# Patient Record
Sex: Male | Born: 1960 | Race: White | Hispanic: No | Marital: Married | Smoking: Former smoker
Health system: Southern US, Community
[De-identification: ages and names within clinical notes are randomized; demographics above are authoritative.]

## PROBLEM LIST (undated history)

## (undated) DIAGNOSIS — F17200 Nicotine dependence, unspecified, uncomplicated: Secondary | ICD-10-CM

## (undated) DIAGNOSIS — I10 Essential (primary) hypertension: Secondary | ICD-10-CM

## (undated) DIAGNOSIS — G4730 Sleep apnea, unspecified: Secondary | ICD-10-CM

## (undated) DIAGNOSIS — M1990 Unspecified osteoarthritis, unspecified site: Secondary | ICD-10-CM

## (undated) DIAGNOSIS — T7840XA Allergy, unspecified, initial encounter: Secondary | ICD-10-CM

## (undated) DIAGNOSIS — Z803 Family history of malignant neoplasm of breast: Secondary | ICD-10-CM

## (undated) DIAGNOSIS — K760 Fatty (change of) liver, not elsewhere classified: Secondary | ICD-10-CM

## (undated) DIAGNOSIS — R7303 Prediabetes: Secondary | ICD-10-CM

## (undated) DIAGNOSIS — Z800 Family history of malignant neoplasm of digestive organs: Secondary | ICD-10-CM

## (undated) DIAGNOSIS — K219 Gastro-esophageal reflux disease without esophagitis: Secondary | ICD-10-CM

## (undated) DIAGNOSIS — E785 Hyperlipidemia, unspecified: Secondary | ICD-10-CM

## (undated) DIAGNOSIS — Z8481 Family history of carrier of genetic disease: Secondary | ICD-10-CM

## (undated) HISTORY — DX: Morbid (severe) obesity due to excess calories: E66.01

## (undated) HISTORY — DX: Sleep apnea, unspecified: G47.30

## (undated) HISTORY — DX: Hyperlipidemia, unspecified: E78.5

## (undated) HISTORY — DX: Gastro-esophageal reflux disease without esophagitis: K21.9

## (undated) HISTORY — DX: Family history of carrier of genetic disease: Z84.81

## (undated) HISTORY — DX: Nicotine dependence, unspecified, uncomplicated: F17.200

## (undated) HISTORY — DX: Family history of malignant neoplasm of digestive organs: Z80.0

## (undated) HISTORY — PX: JOINT REPLACEMENT: SHX530

## (undated) HISTORY — DX: Fatty (change of) liver, not elsewhere classified: K76.0

## (undated) HISTORY — DX: Essential (primary) hypertension: I10

## (undated) HISTORY — DX: Allergy, unspecified, initial encounter: T78.40XA

## (undated) HISTORY — DX: Unspecified osteoarthritis, unspecified site: M19.90

## (undated) HISTORY — DX: Family history of malignant neoplasm of breast: Z80.3

## (undated) HISTORY — PX: KNEE ARTHROSCOPY: SUR90

---

## 2001-10-26 ENCOUNTER — Emergency Department (HOSPITAL_COMMUNITY): Admission: EM | Admit: 2001-10-26 | Discharge: 2001-10-26 | Payer: Self-pay | Admitting: *Deleted

## 2001-10-26 ENCOUNTER — Encounter: Payer: Self-pay | Admitting: Emergency Medicine

## 2003-04-15 ENCOUNTER — Encounter: Payer: Self-pay | Admitting: Internal Medicine

## 2003-04-15 ENCOUNTER — Ambulatory Visit (HOSPITAL_BASED_OUTPATIENT_CLINIC_OR_DEPARTMENT_OTHER): Admission: RE | Admit: 2003-04-15 | Discharge: 2003-04-15 | Payer: Self-pay | Admitting: Family Medicine

## 2003-05-16 ENCOUNTER — Emergency Department (HOSPITAL_COMMUNITY): Admission: EM | Admit: 2003-05-16 | Discharge: 2003-05-16 | Payer: Self-pay | Admitting: Emergency Medicine

## 2003-05-16 ENCOUNTER — Encounter: Payer: Self-pay | Admitting: Emergency Medicine

## 2005-01-08 ENCOUNTER — Ambulatory Visit: Payer: Self-pay | Admitting: Family Medicine

## 2005-02-26 ENCOUNTER — Ambulatory Visit: Payer: Self-pay | Admitting: Family Medicine

## 2005-06-19 ENCOUNTER — Ambulatory Visit: Payer: Self-pay | Admitting: Family Medicine

## 2006-08-13 ENCOUNTER — Ambulatory Visit: Payer: Self-pay | Admitting: Family Medicine

## 2007-02-14 ENCOUNTER — Ambulatory Visit: Payer: Self-pay | Admitting: Family Medicine

## 2007-02-14 LAB — CONVERTED CEMR LAB
ALT: 40 units/L (ref 0–40)
AST: 30 units/L (ref 0–37)
Cholesterol: 158 mg/dL (ref 0–200)
HDL: 32.6 mg/dL — ABNORMAL LOW (ref >39.0)
Hgb A1c MFr Bld: 5.6 % (ref 4.6–6.0)
LDL Cholesterol: 108 mg/dL — ABNORMAL HIGH (ref 0–99)
Total CHOL/HDL Ratio: 4.8
Triglycerides: 88 mg/dL (ref 0–149)
VLDL: 18 mg/dL (ref 0–40)

## 2007-03-13 ENCOUNTER — Ambulatory Visit: Payer: Self-pay | Admitting: Family Medicine

## 2007-07-16 ENCOUNTER — Emergency Department (HOSPITAL_COMMUNITY): Admission: EM | Admit: 2007-07-16 | Discharge: 2007-07-17 | Payer: Self-pay | Admitting: Emergency Medicine

## 2007-07-22 DIAGNOSIS — G4730 Sleep apnea, unspecified: Secondary | ICD-10-CM | POA: Insufficient documentation

## 2007-07-22 DIAGNOSIS — G4733 Obstructive sleep apnea (adult) (pediatric): Secondary | ICD-10-CM | POA: Insufficient documentation

## 2007-07-22 DIAGNOSIS — I10 Essential (primary) hypertension: Secondary | ICD-10-CM | POA: Insufficient documentation

## 2007-07-22 DIAGNOSIS — F17200 Nicotine dependence, unspecified, uncomplicated: Secondary | ICD-10-CM | POA: Insufficient documentation

## 2007-07-22 DIAGNOSIS — E785 Hyperlipidemia, unspecified: Secondary | ICD-10-CM | POA: Insufficient documentation

## 2007-08-04 ENCOUNTER — Ambulatory Visit: Payer: Self-pay | Admitting: Family Medicine

## 2007-08-04 DIAGNOSIS — K219 Gastro-esophageal reflux disease without esophagitis: Secondary | ICD-10-CM | POA: Insufficient documentation

## 2007-08-06 LAB — CONVERTED CEMR LAB
ALT: 46 units/L (ref 0–53)
AST: 35 units/L (ref 0–37)
Albumin: 3.9 g/dL (ref 3.5–5.2)
Alkaline Phosphatase: 59 units/L (ref 39–117)
BUN: 11 mg/dL (ref 6–23)
Basophils Absolute: 0 10*3/uL (ref 0.0–0.1)
Basophils Relative: 0.3 % (ref 0.0–1.0)
Bilirubin, Direct: 0.1 mg/dL (ref 0.0–0.3)
CO2: 33 meq/L — ABNORMAL HIGH (ref 19–32)
Calcium: 10 mg/dL (ref 8.4–10.5)
Chloride: 103 meq/L (ref 96–112)
Cholesterol: 156 mg/dL (ref 0–200)
Creatinine, Ser: 1 mg/dL (ref 0.4–1.5)
Eosinophils Absolute: 0.2 10*3/uL (ref 0.0–0.6)
Eosinophils Relative: 2.3 % (ref 0.0–5.0)
GFR calc Af Amer: 104 mL/min
GFR calc non Af Amer: 86 mL/min
Glucose, Bld: 101 mg/dL — ABNORMAL HIGH (ref 70–99)
HCT: 46.1 % (ref 39.0–52.0)
HDL: 32.9 mg/dL — ABNORMAL LOW (ref >39.0)
Hemoglobin: 16 g/dL (ref 13.0–17.0)
LDL Cholesterol: 109 mg/dL — ABNORMAL HIGH (ref 0–99)
Lymphocytes Relative: 31.8 % (ref 12.0–46.0)
MCHC: 34.8 g/dL (ref 30.0–36.0)
MCV: 89.2 fL (ref 78.0–100.0)
Monocytes Absolute: 0.9 10*3/uL — ABNORMAL HIGH (ref 0.2–0.7)
Monocytes Relative: 9.2 % (ref 3.0–11.0)
Neutro Abs: 5.2 10*3/uL (ref 1.4–7.7)
Neutrophils Relative %: 56.4 % (ref 43.0–77.0)
Platelets: 252 10*3/uL (ref 150–400)
Potassium: 4.1 meq/L (ref 3.5–5.1)
RBC: 5.17 M/uL (ref 4.22–5.81)
RDW: 13.4 % (ref 11.5–14.6)
Sodium: 144 meq/L (ref 135–145)
TSH: 1.5 microintl units/mL (ref 0.35–5.50)
Total Bilirubin: 1.1 mg/dL (ref 0.3–1.2)
Total CHOL/HDL Ratio: 4.7
Total Protein: 7.4 g/dL (ref 6.0–8.3)
Triglycerides: 72 mg/dL (ref 0–149)
VLDL: 14 mg/dL (ref 0–40)
WBC: 9.3 10*3/uL (ref 4.5–10.5)

## 2008-12-30 ENCOUNTER — Telehealth: Payer: Self-pay | Admitting: Family Medicine

## 2009-08-31 ENCOUNTER — Encounter: Payer: Self-pay | Admitting: Family Medicine

## 2009-09-01 ENCOUNTER — Encounter: Payer: Self-pay | Admitting: Family Medicine

## 2009-11-11 ENCOUNTER — Ambulatory Visit: Payer: Self-pay | Admitting: Family Medicine

## 2010-08-31 ENCOUNTER — Encounter: Payer: Self-pay | Admitting: Family Medicine

## 2010-12-07 ENCOUNTER — Telehealth: Payer: Self-pay | Admitting: Family Medicine

## 2011-01-24 ENCOUNTER — Ambulatory Visit
Admission: RE | Admit: 2011-01-24 | Discharge: 2011-01-24 | Payer: Self-pay | Source: Home / Self Care | Attending: Family Medicine | Admitting: Family Medicine

## 2011-01-24 ENCOUNTER — Encounter: Payer: Self-pay | Admitting: Family Medicine

## 2011-01-24 ENCOUNTER — Telehealth: Payer: Self-pay | Admitting: Family Medicine

## 2011-01-24 LAB — CONVERTED CEMR LAB
Bacteria, UA: 0
Blood in Urine, dipstick: NEGATIVE
Ketones, urine, test strip: NEGATIVE
Mucus, UA: 0
Nitrite: NEGATIVE
Protein, U semiquant: NEGATIVE
RBC / HPF: 0
Specific Gravity, Urine: >=1.03
Urobilinogen, UA: 0.2

## 2011-01-30 NOTE — Progress Notes (Signed)
Summary: lisinopril  Prescriptions: LISINOPRIL-HYDROCHLOROTHIAZIDE 20-25 MG  TABS (LISINOPRIL-HYDROCHLOROTHIAZIDE) take one by mouth daily  #30 x 0   Entered by:   Melody Comas   Authorized by:   Judith Part MD   Signed by:   Melody Comas on 12/07/2010   Method used:   Electronically to        [facility] (retail)       [address]       [place], [place]  [address]       Ph: [phone]       Fax: [phone]   RxID:   [id number] LISINOPRIL-HYDROCHLOROTHIAZIDE 20-25 MG  TABS (LISINOPRIL-HYDROCHLOROTHIAZIDE) take one by mouth daily  #90 x 3   Entered and Authorized by:   Melody Comas   Signed by:   Melody Comas on 12/07/2010   Method used:   Print then Give to Patient   RxID:   [id number]  A written rx was not given to patient, it was done in error.

## 2011-02-01 NOTE — Progress Notes (Signed)
Summary: Lisinopril Refill  Phone Note Refill Request   Refills Requested: Medication #1:  LISINOPRIL-HYDROCHLOROTHIAZIDE 20-25 MG  TABS take one by mouth daily Patient is out of med and needs refills. He forgot to mention it while he was at his appt. Send to [facility] on [address].    Method Requested: Electronic Initial call taken by: Janee Morn CMA Duncan Dull),  January 24, 2011 2:26 PM  Follow-up for Phone Call        I will refil electronically Follow-up by: Judith Part MD,  January 24, 2011 4:24 PM  Additional Follow-up for Phone Call Additional follow up Details #1::        Dr Milinda Antis sent med to [facility] on [address] electronically. Left message for patient to call back. Lewanda Rife LPN  January 24, 2011 4:40 PM   Advised pt. Additional Follow-up by: Lowella Petties CMA, AAMA,  January 24, 2011 4:48 PM

## 2011-02-07 NOTE — Letter (Signed)
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

## 2011-02-07 NOTE — Assessment & Plan Note (Signed)
Summary: DOT PHYSICIAL   Vital Signs:  Patient profile:   50 year old male Height:      69.5 inches Weight:      375 pounds BMI:     54.78 Temp:     97.5 degrees F oral Pulse rate:   88 / minute Pulse rhythm:   regular BP sitting:   110 / 70  (left arm) Cuff size:   large CC: DOT physical  Vision Screening:Left eye w/o correction: 20 / 20 Right Eye w/o correction: 20 / 15 Both eyes w/o correction:  20/ 15  Color vision testing: normal      Vision Entered By: Selena Batten Dance CMA Duncan Dull) (January 24, 2011 12:49 PM)  Hearing Screen 25db HL: Left  500 hz: 25db 1000 hz: 25db 2000 hz: 25db 4000 hz: 25db Right  500 hz: 25db 1000 hz: 25db 2000 hz: 25db 4000 hz: 25db    History of Present Illness: her for DOT exam  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    vision screen ok and hearing ok  ua ok   cpap works well for his sleep apnea    bp good at 110/70 HTN well controlled   lipids trig 93 and HDL 32 and LDL 95  on red yeast rice  wt down a bit - watching diet - more green veg also avoiding fatty meats and fried stuff  planning some exercise  bcb   did PE and blood work at station in [employer] 66     Allergies (verified): No Known Drug Allergies  Past History:  Past Surgical History: Last updated: 07/22/2007 Knee arthroscopy Stress test- neg per patient (2004)  Family History: Last updated: 08/04/2007 Father: MI at 14 Mother: HTN, MI  Siblings:   Social History: Last updated: 07/22/2007 Marital Status: Married Children: none 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 Eyes:  Denies blurring and eye irritation. ENT:  Denies decreased hearing. CV:  Denies chest pain or discomfort, lightheadness, palpitations, and shortness of breath with  exertion. Resp:  Denies cough, shortness of breath, and wheezing. GI:  Denies abdominal pain, change in bowel habits, indigestion, and nausea. GU:  Denies dysuria, nocturia, urinary frequency, and urinary hesitancy. MS:  Denies joint pain, joint swelling, muscle weakness, and stiffness. Derm:  Denies itching, lesion(s), poor wound healing, and rash. Neuro:  Denies headaches, numbness, and tingling. Psych:  mood is generally good . Endo:  Denies cold intolerance, excessive thirst, excessive urination, and heat intolerance. Heme:  Denies abnormal bruising and bleeding.  Physical Exam  General:  morbidly obese and well appearing  Head:  normocephalic, atraumatic, and no abnormalities observed.   Eyes:  vision grossly intact, pupils equal, pupils round, and pupils reactive to light.  no conjunctival pallor, injection or icterus  Ears:  R ear normal and L ear normal.  - scant cerumen Nose:  no nasal discharge.   Mouth:  pharynx pink and moist.   Neck:  supple with full rom and no masses or thyromegally, no JVD or carotid bruit  Chest Wall:  No deformities, masses, tenderness or gynecomastia noted. Lungs:  somewhat distant bs with good air exch and no crackles or wheeze Heart:  Normal rate and regular rhythm. S1 and S2 normal without gallop, murmur, click, rub or other extra sounds. Abdomen:  Bowel sounds positive,abdomen soft and non-tender without masses, organomegaly or hernias noted. no renal bruits  Msk:  No deformity or scoliosis noted of thoracic or lumbar spine.  no acute joint changes  Pulses:  plus one pedal pulses  Extremities:  no CCE today Neurologic:  sensation intact to light touch, gait normal, and DTRs symmetrical and normal.   Skin:  Intact without suspicious lesions or rashes Cervical Nodes:  No lymphadenopathy noted Inguinal Nodes:  No significant adenopathy Psych:  normal affect, talkative and pleasant    Impression & Recommendations:  Problem # 1:  OTH GENERAL  MEDICAL EXAMINATION ADMIN PURPOSES (ICD-V70.3) Assessment Comment Only  here for DOT physical  no restrictions for current work  I do still recommend wt loss for general health  rev health habits/ vision/ hearing tests and ua  forms filled out for 2 year clearance dot   Orders: UA Dipstick w/o Micro (manual) (16109) Audiometry (60454) Vision Screening (09811) Work-Related DOT (91478)  Problem # 2:  HYPERTENSION (ICD-401.9) Assessment: Unchanged  good control - no change med refilled exercise and wt loss recommended  His updated medication list for this problem includes:    Lisinopril-hydrochlorothiazide 20-25 Mg Tabs (Lisinopril-hydrochlorothiazide) .Marland Kitchen... Take one by mouth daily  Orders: UA Dipstick w/o Micro (manual) (29562) Audiometry [phone]) Vision Screening [phone]) Prescription Created Electronically [phone]) Work-Related DOT [phone])  BP today: 110/70 Prior BP: 124/80 (11/11/2009)  Labs Reviewed: K+: 4.1 (08/04/2007) Creat: : 1.0 (08/04/2007)   Chol: 156 (08/04/2007)   HDL: 32.9 (08/04/2007)   LDL: 109 (08/04/2007)   TG: 72 (08/04/2007)  Problem # 3:  HYPERLIPIDEMIA (ICD-272.4) Assessment: Unchanged  currently controlled with diet and red yeast rice will continue to watch transaminases with this   Labs Reviewed: SGOT: 35 (08/04/2007)   SGPT: 46 (08/04/2007)   HDL:32.9 (08/04/2007), 32.6 (02/14/2007)  LDL:109 (08/04/2007), 108 (02/14/2007)  Chol:156 (08/04/2007), 158 (02/14/2007)  Trig:72 (08/04/2007), 88 (02/14/2007)  Orders: UA Dipstick w/o Micro (manual) (24401) Audiometry (02725) Vision Screening (36644) Work-Related DOT (03474)  Complete Medication List: 1)  Adult Aspirin Low Strength 81 Mg Tbdp (Aspirin) .... Take one by mouth daily 2)  Fish Oil Tabs  .... Take two by mouth bid 3)  Vitamin B-12 500 Mcg Tabs (Cyanocobalamin) .... Take one by mouth daily 4)  Ra Vitamin B-6 Cr 200 Mg Tbcr (Pyridoxine hcl) .... Take one by mouth daily 5)   Lisinopril-hydrochlorothiazide 20-25 Mg Tabs (Lisinopril-hydrochlorothiazide) .... Take one by mouth daily 6)  Cpap Mask For Nose  .... To use with sleep as directed for sleep apnea 7)  Cpap Machine  .... For use nightly as directed for sleep apnea 8)  Red Yeast Rice 600 Mg Caps (Red yeast rice extract) .Marland Kitchen.. 1 by mouth once daily 9)  Cinnamon 500 Mg Caps (Cinnamon) .Marland Kitchen.. 1 by mouth once daily 10)  Co Q-10 100 Mg Caps (Coenzyme q10) .Marland Kitchen.. 1 by mouth once daily 11)  Glucosamine 500 Mg Caps (Glucosamine sulfate) .Marland Kitchen.. 1 by mouth once daily 12)  Omeprazole 40 Mg Cpdr (Omeprazole) .Marland Kitchen.. 1 by mouth once daily 13)  Claritin 10 Mg Tabs (Loratadine) .Marland Kitchen.. 1 by mouth once daily  Patient Instructions: 1)  you can raise your HDL (good cholesterol) by increasing exercise and eating omega 3 fatty acid supplement like fish oil or flax seed oil over the counter 2)  you can lower LDL (bad cholesterol) by limiting saturated fats in diet like red meat, fried foods, egg yolks, fatty breakfast meats, high fat dairy products and shellfish  3)  keep working on weight loss  4)  please copy labs and paperwork Prescriptions: LISINOPRIL-HYDROCHLOROTHIAZIDE 20-25 MG  TABS (LISINOPRIL-HYDROCHLOROTHIAZIDE) take one by mouth daily  #30 x 11   Entered and Authorized by:   Judith Part MD   Signed by:   Judith Part MD on 01/24/2011   Method used:   Electronically to        [facility] (retail)       [address]       [place], [place]  [address]       Ph: [phone]       Fax: [phone]   RxID:   [id number]    Orders Added: 1)  UA Dipstick w/o Micro (manual) [81002] 2)  Audiometry [92552] 3)  Vision Screening [66440] 4)  Prescription Created Electronically [[id number]] 5)  Work-Related DOT [34742]    Current Allergies (reviewed today): No known allergies   Laboratory Results   Urine Tests  Date/Time Received: January 24, 2011 12:51 PM  Date/Time Reported: January 24, 2011 12:51 PM   Routine Urinalysis   Color: amber Appearance: Clear Glucose: negative   (Normal Range: Negative) Bilirubin: negative   (Normal Range: Negative) Ketone: negative   (Normal Range: Negative) Spec. Gravity: >=1.030   (Normal Range: 1.003-1.035) Blood: negative   (Normal Range: Negative) pH: 6.0   (Normal Range: 5.0-8.0) Protein: negative   (Normal Range: Negative) Urobilinogen: 0.2   (Normal Range: 0-1) Nitrite: negative   (Normal Range: Negative) Leukocyte Esterace: negative   (Normal Range: Negative)  Urine Microscopic WBC/HPF: 0 RBC/HPF: 0 Bacteria/HPF: 0 Mucous/HPF: 0 Epithelial/HPF: 0-2 Crystals/HPF: few Casts/LPF: 0 Yeast/HPF: 0 Other: 0

## 2011-06-07 ENCOUNTER — Encounter: Payer: Self-pay | Admitting: Family Medicine

## 2011-06-08 ENCOUNTER — Ambulatory Visit (INDEPENDENT_AMBULATORY_CARE_PROVIDER_SITE_OTHER): Payer: 59 | Admitting: Family Medicine

## 2011-06-08 ENCOUNTER — Encounter: Payer: Self-pay | Admitting: Family Medicine

## 2011-06-08 VITALS — BP 126/78 | HR 84 | Temp 97.7°F | Wt 366.1 lb

## 2011-06-08 DIAGNOSIS — M25569 Pain in unspecified knee: Secondary | ICD-10-CM

## 2011-06-08 DIAGNOSIS — M25561 Pain in right knee: Secondary | ICD-10-CM

## 2011-06-08 MED ORDER — IBUPROFEN 200 MG PO TABS: 600.0000 mg | ORAL_TABLET | Freq: Three times a day (TID) | ORAL | Status: AC | PRN

## 2011-06-08 MED ORDER — HYDROCODONE-ACETAMINOPHEN 5-500 MG PO TABS: 1.0000 | ORAL_TABLET | Freq: Four times a day (QID) | ORAL | Status: DC | PRN

## 2011-06-08 NOTE — Patient Instructions (Signed)
I would take 600mg  of ibuprofen up to 3 times a day with food.  Use vicodin as needed with sedation caution. If pain persists, call back so we can talk about options (ie seeing Dr. Eulah Pont).

## 2011-06-08 NOTE — Assessment & Plan Note (Addendum)
I would take 600mg  of ibuprofen up to 3 times a day with food.  Use vicodin as needed with sedation caution.  Likely OA flare with possible MCL strain.  I don't feel sig laxity in joint.  Okay for outpatient fu.  If pain persists, call back so we can talk about options (ie seeing Dr. Eulah Pont or imaging).  I talked to pt about getting weight off to help his knees.  He understood.  Okay for outpatient fu.

## 2011-06-08 NOTE — Progress Notes (Signed)
R Knee pain.  Was unhooking trailer 3-4 days ago and turned.  Felt a pop in his R knee.  Pain since then.  Pain with walking.  No pain sitting.  Can still hear a pop/crackle with ROM now. No swelling. No bruising.  This had happened before but the pain has been worse with this episode.  Pain is medial side of knee.   Prev had seen Dr. Eulah Pont with ortho for L knee.    Meds, vitals, and allergies reviewed.   ROS: See HPI.  Otherwise, noncontributory.  Nad, overweight R knee not puffy or bruised Joint line not ttp Normal ROM but medial pain on terminal flexion, + crepitus on flex/ext MCL tender on testing but doesn't feel lax.  LCL feels stable, not tender on testing.  ACL feels stable No meniscal click elicited. Able to bear weight

## 2011-06-11 ENCOUNTER — Ambulatory Visit: Payer: Self-pay | Admitting: Family Medicine

## 2011-07-02 ENCOUNTER — Telehealth: Payer: Self-pay | Admitting: *Deleted

## 2011-07-02 DIAGNOSIS — M25569 Pain in unspecified knee: Secondary | ICD-10-CM

## 2011-07-02 NOTE — Telephone Encounter (Signed)
Patient was seen two weeks ago with knee pain and he says that now the pain seems to be worse. He is asking what he should do at this point. Please advise.

## 2011-07-03 NOTE — Telephone Encounter (Signed)
Patient notified as instructed by telephone. Was informed by patient that he would like the referral and does not need a refill on the Vicodin at this time. Informed patient that the referral coordinator will be in touch with him to get this set up.

## 2011-07-03 NOTE — Telephone Encounter (Signed)
Left message on machine to call back  

## 2011-07-03 NOTE — Telephone Encounter (Signed)
Patient notified as instructed by telephone. Was informed by patient that he is okay with the Vicodin at this point and does not need a refill

## 2011-07-03 NOTE — Telephone Encounter (Signed)
I thought this would be a self limited flare/strain, but if he isn't getting better we should set him up with Dr. Eulah Pont.  I put in the referral.  See if he needs a refill on pain meds in the meantime- if he does, please call in vicodin as above, 1 po q6h prn pain, #30, no rf.  Thanks.

## 2011-07-12 NOTE — Telephone Encounter (Signed)
Working on [employer]

## 2011-07-19 ENCOUNTER — Telehealth: Payer: Self-pay | Admitting: *Deleted

## 2011-07-19 NOTE — Telephone Encounter (Signed)
Opened in error

## 2011-10-15 LAB — POCT CARDIAC MARKERS
CKMB, poc: 1.1
Myoglobin, poc: 145
Operator id: 272551
Troponin i, poc: <0.05
Troponin i, poc: <0.05

## 2011-10-15 LAB — I-STAT 8, (EC8 V) (CONVERTED LAB)
Acid-Base Excess: 1
Potassium: 3.8
TCO2: 28
pCO2, Ven: 43.5 — ABNORMAL LOW
pH, Ven: 7.394 — ABNORMAL HIGH

## 2011-10-15 LAB — POCT I-STAT CREATININE
Creatinine, Ser: 0.9
Operator id: 272551

## 2012-01-01 HISTORY — PX: COLONOSCOPY: SHX174

## 2012-01-31 ENCOUNTER — Telehealth: Payer: Self-pay | Admitting: Family Medicine

## 2012-02-04 ENCOUNTER — Telehealth: Payer: Self-pay | Admitting: Family Medicine

## 2012-02-04 MED ORDER — LISINOPRIL-HYDROCHLOROTHIAZIDE 20-25 MG PO TABS: 1.0000 | ORAL_TABLET | Freq: Every day | ORAL | Status: DC

## 2012-02-04 NOTE — Telephone Encounter (Signed)
Charles Morales is calling for a refill on his Lisinopril 20/25. He said he took his last one today and needs a refill sent to [address] on [address] in [place]. His phone number is [phone]. He takes 1 a day.

## 2012-02-04 NOTE — Telephone Encounter (Signed)
Done

## 2012-04-06 ENCOUNTER — Other Ambulatory Visit: Payer: Self-pay | Admitting: Family Medicine

## 2012-04-08 ENCOUNTER — Other Ambulatory Visit: Payer: Self-pay

## 2012-04-08 NOTE — Telephone Encounter (Signed)
pts wife said Lisinopril HCTZ was not at [facility]. I apologized and I spoke with [name] at [facility] and she said rx ready for pickup. Patient's wife notified as instructed by telephone that rx is ready. Mrs Geologist, engineering.

## 2012-05-20 ENCOUNTER — Telehealth: Payer: Self-pay | Admitting: Family Medicine

## 2012-05-20 DIAGNOSIS — E785 Hyperlipidemia, unspecified: Secondary | ICD-10-CM

## 2012-05-20 DIAGNOSIS — Z0000 Encounter for general adult medical examination without abnormal findings: Secondary | ICD-10-CM | POA: Insufficient documentation

## 2012-05-20 NOTE — Telephone Encounter (Signed)
Message copied by Judy Pimple on Tue May 20, 2012  5:42 PM ------      Message from: Alvina Chou      Created: Fri May 16, 2012 12:48 PM      Regarding: labs for Wed 5-22       Patient is scheduled for CPX labs, please order future labs, Thanks , Camelia Eng

## 2012-05-21 ENCOUNTER — Other Ambulatory Visit (INDEPENDENT_AMBULATORY_CARE_PROVIDER_SITE_OTHER): Payer: 59

## 2012-05-21 DIAGNOSIS — Z0000 Encounter for general adult medical examination without abnormal findings: Secondary | ICD-10-CM

## 2012-05-21 DIAGNOSIS — E785 Hyperlipidemia, unspecified: Secondary | ICD-10-CM

## 2012-05-21 LAB — CBC WITH DIFFERENTIAL/PLATELET
Basophils Absolute: 0 K/uL (ref 0.0–0.1)
Basophils Relative: 0.4 % (ref 0.0–3.0)
Eosinophils Absolute: 0.1 K/uL (ref 0.0–0.7)
Eosinophils Relative: 1.6 % (ref 0.0–5.0)
HCT: 44.8 % (ref 39.0–52.0)
Hemoglobin: 14.8 g/dL (ref 13.0–17.0)
Lymphocytes Relative: 34.6 % (ref 12.0–46.0)
Lymphs Abs: 2.3 K/uL (ref 0.7–4.0)
MCHC: 33.1 g/dL (ref 30.0–36.0)
MCV: 90.7 fl (ref 78.0–100.0)
Monocytes Absolute: 0.8 K/uL (ref 0.1–1.0)
Monocytes Relative: 11.9 % (ref 3.0–12.0)
Neutro Abs: 3.4 K/uL (ref 1.4–7.7)
Neutrophils Relative %: 51.5 % (ref 43.0–77.0)
Platelets: 182 K/uL (ref 150.0–400.0)
RBC: 4.94 Mil/uL (ref 4.22–5.81)
RDW: 14.6 % (ref 11.5–14.6)
WBC: 6.6 K/uL (ref 4.5–10.5)

## 2012-05-21 LAB — COMPREHENSIVE METABOLIC PANEL
ALT: 48 U/L (ref 0–53)
CO2: 28 mEq/L (ref 19–32)
Calcium: 9.3 mg/dL (ref 8.4–10.5)
Chloride: 105 mEq/L (ref 96–112)
GFR: 87.16 mL/min (ref >60.00)
Sodium: 141 mEq/L (ref 135–145)
Total Bilirubin: 1.2 mg/dL (ref 0.3–1.2)
Total Protein: 7.1 g/dL (ref 6.0–8.3)

## 2012-05-21 LAB — TSH: TSH: 1.22 u[IU]/mL (ref 0.35–5.50)

## 2012-05-21 LAB — LIPID PANEL: VLDL: 16.6 mg/dL (ref 0.0–40.0)

## 2012-05-23 ENCOUNTER — Ambulatory Visit (INDEPENDENT_AMBULATORY_CARE_PROVIDER_SITE_OTHER): Payer: BC Managed Care – PPO | Admitting: Family Medicine

## 2012-05-23 ENCOUNTER — Encounter: Payer: Self-pay | Admitting: Family Medicine

## 2012-05-23 VITALS — BP 102/70 | HR 60 | Temp 97.8°F | Ht 69.5 in | Wt 383.0 lb

## 2012-05-23 DIAGNOSIS — M25569 Pain in unspecified knee: Secondary | ICD-10-CM

## 2012-05-23 DIAGNOSIS — Z23 Encounter for immunization: Secondary | ICD-10-CM

## 2012-05-23 DIAGNOSIS — Z0000 Encounter for general adult medical examination without abnormal findings: Secondary | ICD-10-CM

## 2012-05-23 DIAGNOSIS — M25561 Pain in right knee: Secondary | ICD-10-CM

## 2012-05-23 DIAGNOSIS — E785 Hyperlipidemia, unspecified: Secondary | ICD-10-CM

## 2012-05-23 DIAGNOSIS — E669 Obesity, unspecified: Secondary | ICD-10-CM

## 2012-05-23 DIAGNOSIS — F17200 Nicotine dependence, unspecified, uncomplicated: Secondary | ICD-10-CM

## 2012-05-23 DIAGNOSIS — I10 Essential (primary) hypertension: Secondary | ICD-10-CM

## 2012-05-23 DIAGNOSIS — Z1211 Encounter for screening for malignant neoplasm of colon: Secondary | ICD-10-CM | POA: Insufficient documentation

## 2012-05-23 MED ORDER — LISINOPRIL-HYDROCHLOROTHIAZIDE 20-25 MG PO TABS: 1.0000 | ORAL_TABLET | Freq: Every day | ORAL | Status: DC

## 2012-05-23 NOTE — Patient Instructions (Signed)
We will refer you for colonosc at check out  Tdap vaccine today Make sure to get a flu shot next season Work on gradually working up to exercise 5 days per week -non impact like bike or swimming Eat healthy and cut portions for weight loss

## 2012-05-23 NOTE — Progress Notes (Signed)
Subjective:    Patient ID: Charles Morales, male    DOB: 15-Oct-1961, 51 y.o.   MRN: [id number]  HPI Here for health maintenance exam and to review chronic medical problems   Is feeling good  Doing well overall  Work has been very busy    bp is  102/70   Today No cp or palpitations or headaches or edema  No side effects to medicines  - ace/ hct   Chemistry      Component Value Date/Time   NA 141 05/21/2012 0812   K 3.6 05/21/2012 0812   CL 105 05/21/2012 0812   CO2 28 05/21/2012 0812   BUN 16 05/21/2012 0812   CREATININE 1.0 05/21/2012 0812      Component Value Date/Time   CALCIUM 9.3 05/21/2012 0812   ALKPHOS 43 05/21/2012 0812   AST 36 05/21/2012 0812   ALT 48 05/21/2012 0812   BILITOT 1.2 05/21/2012 0812      Wt is up 17 lb bmi is 55 Chews tab -- (not a smoker)- has cut back on that - is working towards quitting - quit date is on birthday in august  Does see the dentist regularly     Diet-- watching some of the fats - knows he will have to make some big changes  He made a big weight loss effort 2 years ago and lost 25 lb - gave up fried foods/ drank water / green tea and more salads  Was proud of himself/ had more energy and then fell off the wagon again  Portions are his biggest problem Exercise- very limited due to his knee Is interested in starting to swim-will have acess to pool    Tdap- due for  Flu shot --- did not get this season    Lipid status Lab Results  Component Value Date   CHOL 148 05/21/2012   CHOL 156 08/04/2007   CHOL 158 02/14/2007   Lab Results  Component Value Date   HDL 30.40* 05/21/2012   HDL 32.9* 08/04/2007   HDL 32.6* 02/14/2007   Lab Results  Component Value Date   LDLCALC 101* 05/21/2012   LDLCALC 109* 08/04/2007   LDLCALC 108* 02/14/2007   Lab Results  Component Value Date   TRIG 83.0 05/21/2012   TRIG 72 08/04/2007   TRIG 88 02/14/2007   Lab Results  Component Value Date   CHOLHDL 5 05/21/2012   CHOLHDL 4.7 CALC 08/04/2007   CHOLHDL  4.8 CALC 02/14/2007   No results found for this basename: LDLDIRECT   is on fish oil and omega 3  Cholesterol is fairly stable - well controlled  Is trying to eat more baked foods - but still has some fatty food Does take red yeast rice -no problems with that   Does continue to have R knee problems -- saw Dr Farris Has  He px meloxicam and cortisone shot  Is candidate for knee repl in future for OS   No prostate ca or prostate problems in the family   He has noticed blood in stool - occ bright red streaks  Strains occas   Patient Active Problem List  Diagnoses  . HYPERLIPIDEMIA  . OBESITY  . SMOKELESS TOBACCO ABUSE  . HYPERTENSION  . G E R D  . SLEEP APNEA  . Knee pain, right  . Routine general medical examination at a health care facility   Past Medical History  Diagnosis Date  . HLD (hyperlipidemia)   . HTN (hypertension)   .  Morbid obesity   . Fatty liver   . Esophageal reflux   . Unspecified sleep apnea   . Current smoker    Past Surgical History  Procedure Date  . Knee arthroscopy    History  Substance Use Topics  . Smoking status: Former Smoker    Quit date: 12/31/1982  . Smokeless tobacco: Current User    Types: Snuff  . Alcohol Use: Yes     Occasional   Family History  Problem Relation Age of Onset  . Heart attack Father 9  . Hypertension Mother   . Heart attack Mother    No Known Allergies Current Outpatient Prescriptions on File Prior to Visit  Medication Sig Dispense Refill  . aspirin 81 MG tablet Take 81 mg by mouth daily.        . Cinnamon 500 MG capsule Take 500 mg by mouth daily.        . Coenzyme Q10 (CO Q 10) 100 MG CAPS Take 1 capsule by mouth daily.        . fish oil-omega-3 fatty acids 1000 MG capsule Take 2 g by mouth 2 (two) times daily.        Marland Kitchen glucosamine-chondroitin 500-400 MG tablet Take 1 tablet by mouth daily.        Marland Kitchen lisinopril-hydrochlorothiazide (PRINZIDE,ZESTORETIC) 20-25 MG per tablet TAKE ONE TABLET BY MOUTH EVERY DAY   30 tablet  2  . loratadine (CLARITIN) 10 MG tablet Take 10 mg by mouth daily.        . NON FORMULARY CPAP at night as directed       . omeprazole (PRILOSEC) 40 MG capsule Take 40 mg by mouth daily.        Marland Kitchen pyridoxine (B-6) 200 MG tablet Take 200 mg by mouth daily.        . Red Yeast Rice 600 MG CAPS Take 1 capsule by mouth daily.        . vitamin B-12 (CYANOCOBALAMIN) 500 MCG tablet Take 500 mcg by mouth daily.            Review of Systems Review of Systems  Constitutional: Negative for fever, appetite change, fatigue and unexpected weight change.  Eyes: Negative for pain and visual disturbance.  Respiratory: Negative for cough and shortness of breath.   Cardiovascular: Negative for cp or palpitations    Gastrointestinal: Negative for nausea, diarrhea and constipation.  Genitourinary: Negative for urgency and frequency.  Skin: Negative for pallor or rash   MSK pos for chronic knee pain  Neurological: Negative for weakness, light-headedness, numbness and headaches.  Hematological: Negative for adenopathy. Does not bruise/bleed easily.  Psychiatric/Behavioral: Negative for dysphoric mood. The patient is not nervous/anxious.         Objective:   Physical Exam  Constitutional: He appears well-nourished.       Morbidly obese and well appearing   HENT:  Head: Normocephalic and atraumatic.  Right Ear: External ear normal.  Left Ear: External ear normal.  Nose: Nose normal.  Mouth/Throat: Oropharynx is clear and moist.  Eyes: Conjunctivae and EOM are normal. Pupils are equal, round, and reactive to light. No scleral icterus.  Neck: Normal range of motion. Neck supple. No JVD present. Carotid bruit is not present. No thyromegaly present.  Cardiovascular: Normal rate, regular rhythm, normal heart sounds and intact distal pulses.  Exam reveals no gallop.   Pulmonary/Chest: Effort normal and breath sounds normal. No respiratory distress. He has no wheezes.  Abdominal: Soft. Bowel sounds  are normal. He exhibits no distension, no abdominal bruit and no mass. There is no tenderness.  Musculoskeletal: Normal range of motion. He exhibits no edema and no tenderness.  Lymphadenopathy:    He has no cervical adenopathy.  Neurological: He is alert. He has normal reflexes. No cranial nerve deficit. He exhibits normal muscle tone. Coordination normal.  Skin: Skin is warm and dry. No rash noted. No erythema. No pallor.       Ruddy complexion  Psychiatric: He has a normal mood and affect.       Cheerful and talkative          Assessment & Plan:

## 2012-05-26 NOTE — Assessment & Plan Note (Signed)
Counseled on need for cessation- disc the risks  Is planning to quit in future  Does see dentist regularly

## 2012-05-26 NOTE — Assessment & Plan Note (Signed)
Disc goals for lipids and reasons to control them Rev labs with pt Rev low sat fat diet in detail   

## 2012-05-26 NOTE — Assessment & Plan Note (Signed)
Discussed how this problem influences overall health and the risks it imposes  Reviewed plan for weight loss with lower calorie diet (via better food choices and also portion control or program like weight watchers) and exercise building up to or more than 30 minutes 5 days per week including some aerobic activity   If not successful with this may be a candidate for bariatric surgery

## 2012-05-26 NOTE — Assessment & Plan Note (Signed)
Reviewed health habits including diet and exercise and skin cancer prevention Also reviewed health mt list, fam hx and immunizations   Rev wellness labs  

## 2012-05-26 NOTE — Assessment & Plan Note (Signed)
Ref to GI for screening colonosc after he turns 51

## 2012-05-26 NOTE — Assessment & Plan Note (Signed)
bp in fair control at this time  No changes needed  Disc lifstyle change with low sodium diet and exercise   

## 2012-06-02 ENCOUNTER — Encounter: Payer: Self-pay | Admitting: Gastroenterology

## 2012-07-21 ENCOUNTER — Encounter: Payer: Self-pay | Admitting: Internal Medicine

## 2012-08-22 ENCOUNTER — Encounter: Payer: BC Managed Care – PPO | Admitting: Gastroenterology

## 2012-08-25 ENCOUNTER — Encounter: Payer: BC Managed Care – PPO | Admitting: Internal Medicine

## 2012-09-05 ENCOUNTER — Ambulatory Visit (AMBULATORY_SURGERY_CENTER): Payer: BC Managed Care – PPO | Admitting: *Deleted

## 2012-09-05 VITALS — Ht 71.0 in | Wt 342.6 lb

## 2012-09-05 DIAGNOSIS — Z1211 Encounter for screening for malignant neoplasm of colon: Secondary | ICD-10-CM

## 2012-09-05 MED ORDER — NA SULFATE-K SULFATE-MG SULF 17.5-3.13-1.6 GM/177ML PO SOLN: ORAL | Status: DC

## 2012-09-08 ENCOUNTER — Encounter: Payer: Self-pay | Admitting: Internal Medicine

## 2012-09-19 ENCOUNTER — Ambulatory Visit (AMBULATORY_SURGERY_CENTER): Payer: BC Managed Care – PPO | Admitting: Internal Medicine

## 2012-09-19 ENCOUNTER — Encounter: Payer: Self-pay | Admitting: Internal Medicine

## 2012-09-19 VITALS — BP 103/57 | HR 57 | Temp 97.3°F | Resp 18 | Ht 71.0 in | Wt 342.0 lb

## 2012-09-19 DIAGNOSIS — K635 Polyp of colon: Secondary | ICD-10-CM

## 2012-09-19 DIAGNOSIS — D126 Benign neoplasm of colon, unspecified: Secondary | ICD-10-CM

## 2012-09-19 DIAGNOSIS — Z1211 Encounter for screening for malignant neoplasm of colon: Secondary | ICD-10-CM

## 2012-09-19 MED ORDER — SODIUM CHLORIDE 0.9 % IV SOLN: 500.0000 mL | INTRAVENOUS | Status: DC

## 2012-09-19 NOTE — Op Note (Signed)
[facility] [address] [place], [address]   COLONOSCOPY PROCEDURE REPORT  PATIENT: [name], [name]  MR#: [id number] BIRTHDATE: [birth year] , 51  yrs. old GENDER: Male ENDOSCOPIST: Beverley Fiedler, MD REFERRED AO:ZHYQM, Marne A. PROCEDURE DATE:  09/19/2012 PROCEDURE:   Colonoscopy with snare polypectomy ASA CLASS:   Class III INDICATIONS:average risk screening and first colonoscopy. MEDICATIONS: MAC sedation, administered by CRNA and propofol (Diprivan) 300mg  IV  DESCRIPTION OF PROCEDURE:   After the risks benefits and alternatives of the procedure were thoroughly explained, informed consent was obtained.  A digital rectal exam revealed no rectal mass.   The LB [id number]  endoscope was introduced through the anus and advanced to the terminal ileum which was intubated for a short distance. No adverse events experienced.   The quality of the prep was Suprep good  The instrument was then slowly withdrawn as the colon was fully examined.   COLON FINDINGS: Normal examined portion of the terminal ileum. A sessile polyp measuring 5 mm in size was found in the descending colon.  A polypectomy was performed with a cold snare.  The resection was complete and the polyp tissue was completely retrieved.   There was moderate diverticulosis noted in the descending colon and sigmoid colon with associated muscular hypertrophy.   Small internal hemorrhoids were found.  Retroflexed views revealed internal hemorrhoids. The time to cecum=4 minutes 30 seconds.  Withdrawal time=12 minutes 03 seconds.  The scope was withdrawn and the procedure completed. COMPLICATIONS: There were no complications.  ENDOSCOPIC IMPRESSION: 1.   Normal terminal ileum. 2.   Sessile polyp measuring 5 mm in size was found in the descending colon; polypectomy was performed with a cold snare 3.   There was moderate diverticulosis noted in the descending colon and sigmoid colon 4.   Small  internal hemorrhoids  RECOMMENDATIONS: 1.  Await pathology results 2.  High fiber diet 3.  If the polyp removed today is proven to be an adenomatous (pre-cancerous) polyp, you will need a repeat colonoscopy in 5 years.  Otherwise you should continue to follow colorectal cancer screening guidelines for "routine risk" patients with colonoscopy in 10 years.  You will receive a letter within 1-2 weeks with the results of your biopsy as well as final recommendations.  Please call my office if you have not received a letter after 3 weeks.  eSigned:  Beverley Fiedler, MD 09/19/2012 9:28 AM  cc: Judy Pimple, MD and The Patient   PATIENT NAME:  [name], [name] MR#: [id number]

## 2012-09-19 NOTE — Patient Instructions (Addendum)
YOU HAD AN ENDOSCOPIC PROCEDURE TODAY AT THE [facility]: Refer to the procedure report that was given to you for any specific questions about what was found during the examination.  If the procedure report does not answer your questions, please call your gastroenterologist to clarify.  If you requested that your care partner not be given the details of your procedure findings, then the procedure report has been included in a sealed envelope for you to review at your convenience later.  YOU SHOULD EXPECT: Some feelings of bloating in the abdomen. Passage of more gas than usual.  Walking can help get rid of the air that was put into your GI tract during the procedure and reduce the bloating. If you had a lower endoscopy (such as a colonoscopy or flexible sigmoidoscopy) you may notice spotting of blood in your stool or on the toilet paper. If you underwent a bowel prep for your procedure, then you may not have a normal bowel movement for a few days.  DIET: Your first meal following the procedure should be a light meal and then it is ok to progress to your normal diet.  A half-sandwich or bowl of soup is an example of a good first meal.  Heavy or fried foods are harder to digest and may make you feel nauseous or bloated.  Likewise meals heavy in dairy and vegetables can cause extra gas to form and this can also increase the bloating.  Drink plenty of fluids but you should avoid alcoholic beverages for 24 hours.  ACTIVITY: Your care partner should take you home directly after the procedure.  You should plan to take it easy, moving slowly for the rest of the day.  You can resume normal activity the day after the procedure however you should NOT DRIVE or use heavy machinery for 24 hours (because of the sedation medicines used during the test).    SYMPTOMS TO REPORT IMMEDIATELY: A gastroenterologist can be reached at any hour.  During normal business hours, 8:30 AM to 5:00 PM Monday through Friday,  call [phone].  After hours and on weekends, please call the GI answering service at [phone] who will take a message and have the physician on call contact you.   Following lower endoscopy (colonoscopy or flexible sigmoidoscopy):  Excessive amounts of blood in the stool  Significant tenderness or worsening of abdominal pains  Swelling of the abdomen that is new, acute  Fever of 100F or higher  FOLLOW UP: If any biopsies were taken you will be contacted by phone or by letter within the next 1-3 weeks.  Call your gastroenterologist if you have not heard about the biopsies in 3 weeks.  Our staff will call the home number listed on your records the next business day following your procedure to check on you and address any questions or concerns that you may have at that time regarding the information given to you following your procedure. This is a courtesy call and so if there is no answer at the home number and we have not heard from you through the emergency physician on call, we will assume that you have returned to your regular daily activities without incident.  SIGNATURES/CONFIDENTIALITY: You and/or your care partner have signed paperwork which will be entered into your electronic medical record.  These signatures attest to the fact that that the information above on your After Visit Summary has been reviewed and is understood.  Full responsibility of the confidentiality of this   discharge information lies with you and/or your care-partner.   Thank-you for choosing us for your medical needs. 

## 2012-09-19 NOTE — Progress Notes (Addendum)
Patient did not have preoperative order for IV antibiotic SSI prophylaxis. (G8918)  Patient did not experience any of the following events: a burn prior to discharge; a fall within the facility; wrong site/side/patient/procedure/implant event; or a hospital transfer or hospital admission upon discharge from the facility. (G8907)  

## 2012-09-22 ENCOUNTER — Telehealth: Payer: Self-pay

## 2012-09-22 NOTE — Telephone Encounter (Signed)
Left message

## 2012-09-23 ENCOUNTER — Encounter: Payer: Self-pay | Admitting: Internal Medicine

## 2013-06-15 ENCOUNTER — Other Ambulatory Visit: Payer: Self-pay | Admitting: *Deleted

## 2013-06-15 MED ORDER — LISINOPRIL-HYDROCHLOROTHIAZIDE 20-25 MG PO TABS: 1.0000 | ORAL_TABLET | Freq: Every day | ORAL | Status: DC

## 2013-06-17 NOTE — Telephone Encounter (Signed)
Pt called ck on status of lisinopril hctz refill; advised sent to [facility] 06/15/13; pt will ck with pharmacy and pt will ck calendar and call back for appt.

## 2013-07-13 ENCOUNTER — Encounter: Payer: Self-pay | Admitting: Family Medicine

## 2013-07-13 ENCOUNTER — Ambulatory Visit (INDEPENDENT_AMBULATORY_CARE_PROVIDER_SITE_OTHER): Payer: BC Managed Care – PPO | Admitting: Family Medicine

## 2013-07-13 VITALS — BP 118/70 | HR 68 | Temp 98.4°F | Wt 331.5 lb

## 2013-07-13 DIAGNOSIS — E785 Hyperlipidemia, unspecified: Secondary | ICD-10-CM

## 2013-07-13 DIAGNOSIS — I10 Essential (primary) hypertension: Secondary | ICD-10-CM

## 2013-07-13 DIAGNOSIS — E669 Obesity, unspecified: Secondary | ICD-10-CM

## 2013-07-13 LAB — CBC WITH DIFFERENTIAL/PLATELET
Basophils Absolute: 0 10*3/uL (ref 0.0–0.1)
Eosinophils Absolute: 0.3 10*3/uL (ref 0.0–0.7)
HCT: 46.7 % (ref 39.0–52.0)
Hemoglobin: 15.7 g/dL (ref 13.0–17.0)
Lymphs Abs: 2.3 10*3/uL (ref 0.7–4.0)
MCHC: 33.6 g/dL (ref 30.0–36.0)
Monocytes Absolute: 0.6 10*3/uL (ref 0.1–1.0)
Neutro Abs: 3.4 10*3/uL (ref 1.4–7.7)
Platelets: 169 10*3/uL (ref 150.0–400.0)
RDW: 14.6 % (ref 11.5–14.6)

## 2013-07-13 LAB — COMPREHENSIVE METABOLIC PANEL
ALT: 23 U/L (ref 0–53)
AST: 20 U/L (ref 0–37)
Alkaline Phosphatase: 43 U/L (ref 39–117)
Creatinine, Ser: 0.9 mg/dL (ref 0.4–1.5)
GFR: 89.96 mL/min (ref >60.00)
Sodium: 139 mEq/L (ref 135–145)
Total Bilirubin: 0.9 mg/dL (ref 0.3–1.2)

## 2013-07-13 MED ORDER — LISINOPRIL-HYDROCHLOROTHIAZIDE 20-25 MG PO TABS: 1.0000 | ORAL_TABLET | Freq: Every day | ORAL | Status: DC

## 2013-07-13 MED ORDER — OMEPRAZOLE 40 MG PO CPDR: 40.0000 mg | DELAYED_RELEASE_CAPSULE | Freq: Every day | ORAL | Status: DC

## 2013-07-13 NOTE — Progress Notes (Signed)
Subjective:    Patient ID: Charles Morales, male    DOB: Jan 13, 1961, 52 y.o.   MRN: [id number]  HPI Here for f/u of chronic medical problems   Wt is down 11 lb with bmi of 46 (morbid obesity) Working on weight loss  Using My Fitness Pal app- is enjoying it - finds it a challenge  Around 2300 cal per day  Getting out and exercising as much as he can -- had some knee problems   Hyperlipidemia- due for a check Lab Results  Component Value Date   CHOL 148 05/21/2012   HDL 30.40* 05/21/2012   LDLCALC 101* 05/21/2012   TRIG 83.0 05/21/2012   CHOLHDL 5 05/21/2012   Diet controlled - low HDL He is avoiding fried foods - eating lean protein like fish  Eating more salads - lots of those - at least once per day   bp is stable today  No cp or palpitations or headaches or edema  No side effects to medicines  BP Readings from Last 3 Encounters:  07/13/13 118/70  09/19/12 103/57  05/23/12 102/70     On lisinopril hct-no problems  Due for labs   Mood - is pretty chipper for the most part / stays motivated  Some stressors - mother fell on Saturday - cut her knee   Sleep apnea- is well controlled and he "loves" his cpap- and it really makes him feel better   Patient Active Problem List   Diagnosis Date Noted  . Colon cancer screening 05/23/2012  . Routine general medical examination at a health care facility 05/20/2012  . Knee pain, right 06/08/2011  . G E R D 08/04/2007  . HYPERLIPIDEMIA 07/22/2007  . OBESITY 07/22/2007  . SMOKELESS TOBACCO ABUSE 07/22/2007  . HYPERTENSION 07/22/2007  . SLEEP APNEA 07/22/2007   Past Medical History  Diagnosis Date  . HLD (hyperlipidemia)   . HTN (hypertension)   . Morbid obesity   . Fatty liver   . Esophageal reflux   . Unspecified sleep apnea   . Tobacco use disorder   . Allergy   . Arthritis     right knee   Past Surgical History  Procedure Laterality Date  . Knee arthroscopy      left   History  Substance Use Topics  . Smoking  status: Former Smoker    Quit date: 12/31/1982  . Smokeless tobacco: Current User    Types: Snuff  . Alcohol Use: Yes     Comment: Occasional   Family History  Problem Relation Age of Onset  . Heart attack Father 8  . Hypertension Mother   . Heart attack Mother   . Colon cancer Neg Hx   . Stomach cancer Neg Hx    No Known Allergies Current Outpatient Prescriptions on File Prior to Visit  Medication Sig Dispense Refill  . aspirin 81 MG tablet Take 81 mg by mouth daily.        . cetirizine (ZYRTEC) 10 MG tablet Take 10 mg by mouth daily.      . Cinnamon 500 MG capsule Take 500 mg by mouth daily.        . Coenzyme Q10 (CO Q 10) 100 MG CAPS Take 1 capsule by mouth daily.        . fish oil-omega-3 fatty acids 1000 MG capsule Take 2 g by mouth 2 (two) times daily.        Marland Kitchen glucosamine-chondroitin 500-400 MG tablet Take 1 tablet by mouth daily.        Marland Kitchen  lisinopril-hydrochlorothiazide (PRINZIDE,ZESTORETIC) 20-25 MG per tablet Take 1 tablet by mouth daily. * Needs to schedule appointment with Dr for any additional refills*  30 tablet  0  . NON FORMULARY CPAP at night as directed       . omeprazole (PRILOSEC) 40 MG capsule Take 40 mg by mouth daily.        Marland Kitchen pyridoxine (B-6) 200 MG tablet Take 200 mg by mouth daily.        . Red Yeast Rice 600 MG CAPS Take 1 capsule by mouth daily.        . vitamin B-12 (CYANOCOBALAMIN) 500 MCG tablet Take 500 mcg by mouth daily.        . meloxicam (MOBIC) 15 MG tablet Take 15 mg by mouth daily.       No current facility-administered medications on file prior to visit.     Review of Systems Review of Systems  Constitutional: Negative for fever, appetite change, fatigue and unexpected weight change.  Eyes: Negative for pain and visual disturbance.  Respiratory: Negative for cough and shortness of breath.   Cardiovascular: Negative for cp or palpitations    Gastrointestinal: Negative for nausea, diarrhea and constipation.  Genitourinary: Negative for  urgency and frequency.  Skin: Negative for pallor or rash   Neurological: Negative for weakness, light-headedness, numbness and headaches.  Hematological: Negative for adenopathy. Does not bruise/bleed easily.  Psychiatric/Behavioral: Negative for dysphoric mood. The patient is not nervous/anxious.         Objective:   Physical Exam  Constitutional: He appears well-developed and well-nourished. No distress.  Morbidly obese and well appearing   HENT:  Head: Normocephalic and atraumatic.  Mouth/Throat: Oropharynx is clear and moist.  Eyes: Conjunctivae and EOM are normal. Pupils are equal, round, and reactive to light. No scleral icterus.  Neck: Normal range of motion. Neck supple. No JVD present. Carotid bruit is not present. No thyromegaly present.  Cardiovascular: Normal rate, regular rhythm, normal heart sounds and intact distal pulses.  Exam reveals no gallop.   Pulmonary/Chest: Effort normal and breath sounds normal. No respiratory distress. He has no wheezes. He has no rales.  Abdominal: Soft. Bowel sounds are normal. He exhibits no abdominal bruit.  Musculoskeletal: He exhibits no edema.  Lymphadenopathy:    He has no cervical adenopathy.  Neurological: He is alert. He has normal reflexes. No cranial nerve deficit. He exhibits normal muscle tone. Coordination normal.  Skin: Skin is warm and dry. No rash noted. No erythema. No pallor.  Psychiatric: He has a normal mood and affect.          Assessment & Plan:

## 2013-07-13 NOTE — Assessment & Plan Note (Signed)
Discussed how this problem influences overall health and the risks it imposes  Reviewed plan for weight loss with lower calorie diet (via better food choices and also portion control or program like weight watchers) and exercise building up to or more than 30 minutes 5 days per week including some aerobic activity   commended on great job so far! Enc to keep working on that

## 2013-07-13 NOTE — Patient Instructions (Addendum)
Keep up the good work with healthy diet/ exercise and weight loss  Try some low impact exercise also  Lab today

## 2013-07-13 NOTE — Assessment & Plan Note (Signed)
Expect improvement with better diet and exercise. Disc goals for lipids and reasons to control them Rev low sat fat diet in detail

## 2013-07-13 NOTE — Assessment & Plan Note (Signed)
bp in fair control at this time  No changes needed  Disc lifstyle change with low sodium diet and exercise   Labs today 

## 2013-09-24 ENCOUNTER — Ambulatory Visit (INDEPENDENT_AMBULATORY_CARE_PROVIDER_SITE_OTHER): Payer: BC Managed Care – PPO | Admitting: Family Medicine

## 2013-09-24 ENCOUNTER — Encounter: Payer: Self-pay | Admitting: Family Medicine

## 2013-09-24 VITALS — BP 110/70 | HR 59 | Temp 97.5°F | Ht 69.5 in | Wt 333.0 lb

## 2013-09-24 DIAGNOSIS — E669 Obesity, unspecified: Secondary | ICD-10-CM

## 2013-09-24 DIAGNOSIS — I10 Essential (primary) hypertension: Secondary | ICD-10-CM

## 2013-09-24 DIAGNOSIS — E785 Hyperlipidemia, unspecified: Secondary | ICD-10-CM

## 2013-09-24 DIAGNOSIS — R9431 Abnormal electrocardiogram [ECG] [EKG]: Secondary | ICD-10-CM

## 2013-09-24 DIAGNOSIS — I493 Ventricular premature depolarization: Secondary | ICD-10-CM

## 2013-09-24 DIAGNOSIS — I4949 Other premature depolarization: Secondary | ICD-10-CM

## 2013-09-24 NOTE — Progress Notes (Signed)
[facility] [address] [place] [address] Phone: [phone] Fax: [phone]  Date:  09/24/2013   Name:  Charles Morales   DOB:  September 29, 1961   MRN:  [id number] Gender: male Age: 52 y.o.  Primary Physician:  Roxy Manns, MD  Evaluating MD: Hannah Beat, MD   Chief Complaint: Review EKG   History of Present Illness:  Charles Morales is a 52 y.o. pleasant patient who presents with the following:  This is a morbidly obese patient with a BMI of 48, hyperlipidemia, hypertension, current smokeless tobacco user, former smoker with cardiac disease in both parents and a father with d/c from early cardiac disease.   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 I did not see significant ST depression on test. 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 A company out of [place], [place] read this test.  Denies chest pain or shortness of breath. He was up most of the night before this test.   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    Patient Active Problem List   Diagnosis Date Noted  . Colon cancer screening 05/23/2012  . Routine general medical examination at a health care facility 05/20/2012  . Knee pain, right 06/08/2011  . G E R D 08/04/2007  . HYPERLIPIDEMIA 07/22/2007  . OBESITY 07/22/2007  . SMOKELESS TOBACCO ABUSE 07/22/2007  . HYPERTENSION 07/22/2007  . SLEEP APNEA 07/22/2007    Past Medical History  Diagnosis Date  . HLD (hyperlipidemia)   . HTN (hypertension)   . Morbid obesity   . Fatty liver   . Esophageal reflux   . Unspecified sleep apnea   . Tobacco use disorder   . Allergy   . Arthritis     right knee    Past Surgical History  Procedure Laterality Date  . Knee arthroscopy      left    History   Social History  . Marital Status: Married    Spouse Name: N/A    Number of Children: 0  . 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 Smoking status: Former Smoker    Quit date: 12/31/1982  . Smokeless tobacco: Current User    Types: Snuff  . Alcohol Use: Yes     Comment: Rare  . Drug Use: No  . Sexual Activity: Not on file   Other Topics Concern  . 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 Heart attack Father 86  . Hypertension Mother   . Heart attack Mother   . Colon cancer Neg Hx   . Stomach cancer Neg Hx     No Known Allergies  Medication list has been reviewed and updated.  Outpatient Prescriptions Prior to Visit  Medication Sig Dispense Refill  . aspirin 81 MG tablet Take 81 mg by mouth daily.        . cetirizine (ZYRTEC) 10 MG tablet Take 10 mg by mouth daily.      . Cinnamon 500 MG capsule Take 500 mg by mouth daily.        . Coenzyme Q10 (CO Q 10) 100 MG CAPS Take 1 capsule by  mouth daily.        . fish oil-omega-3 fatty acids 1000 MG capsule Take 2 g by mouth 2 (two) times daily.        Marland Kitchen glucosamine-chondroitin 500-400 MG tablet Take 1 tablet by mouth daily.        Marland Kitchen lisinopril-hydrochlorothiazide (PRINZIDE,ZESTORETIC) 20-25 MG per tablet Take 1 tablet by mouth daily. * Needs to schedule appointment with Dr for any additional refills*  90 tablet  3  . NON FORMULARY CPAP at night as directed       . omeprazole (PRILOSEC) 40 MG capsule Take 1 capsule (40 mg total) by mouth daily.  90 capsule  3  . pyridoxine (B-6) 200 MG tablet Take 200 mg by mouth daily.        . Red Yeast Rice 600 MG CAPS Take 1 capsule by mouth daily.        . vitamin B-12 (CYANOCOBALAMIN) 500 MCG tablet Take 500 mcg by mouth daily.        . meloxicam (MOBIC) 15 MG tablet Take 15 mg by mouth daily.       No facility-administered medications prior to visit.    Review of Systems:   GEN: No acute illnesses, no fevers, chills. GI: No n/v/d,  eating normally Pulm: No SOB Interactive and getting along well at home.  Otherwise, ROS is as per the HPI.   Physical Examination: BP 110/70  Pulse 59  Temp(Src) 97.5 F (36.4 C) (Oral)  Ht 5' 9.5" (1.765 m)  Wt 333 lb (151.048 kg)  BMI 48.49 kg/m2  Ideal Body Weight: Weight in (lb) to have BMI = 25: 171.4   GEN: WDWN, NAD, Non-toxic, A & O x 3 HEENT: Atraumatic, Normocephalic. Neck supple. No masses, No LAD. Ears and Nose: No external deformity. CV: RRR, No M/G/R. No JVD. No thrill. No extra heart sounds. PULM: CTA B, no wheezes, crackles, rhonchi. No retractions. No resp. distress. No accessory muscle use. EXTR: No c/c/e NEURO Normal gait.  PSYCH: Normally interactive. Conversant. Not depressed or anxious appearing.  Calm demeanor.    Assessment and Plan:  Nonspecific abnormal electrocardiogram (ECG) (EKG) - Plan: Exercise Tolerance Test  Unspecified essential hypertension - Plan: Exercise Tolerance Test  Obesity, unspecified - Plan: Exercise Tolerance Test  Other and unspecified hyperlipidemia - Plan: Exercise Tolerance Test  PVC (premature ventricular contraction) - Plan: Exercise Tolerance Test  Very high risk morbidly obese patient with HTN, Hyperlipidemia, h/o tobacco, and very strong FH with an abnormal stress test using a bicycle protocol.  I am unable to interpret his test.  Given risk factors, obtain a standard Bruce protocol ETT interpreted by Cardiology. 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  Orders Today:  Orders Placed This Encounter  Procedures  . Exercise Tolerance Test    Standing Status: Future     Number of Occurrences:      Standing Expiration Date: 09/24/2014    Order Specific Question:  Where should this test be performed    Answer:  [facility]    Updated Medication List: (Includes new medications, updates to list, dose adjustments) No orders of the defined types were placed in this encounter.     Medications Discontinued: There are no discontinued medications.    Signed, Elpidio Galea. Kyndra Condron, MD 09/24/2013 4:26 PM

## 2013-09-24 NOTE — Patient Instructions (Addendum)
REFERRAL: GO THE THE FRONT ROOM AT THE ENTRANCE OF OUR CLINIC, NEAR CHECK IN. ASK FOR MARION. SHE WILL HELP YOU SET UP YOUR REFERRAL. DATE: TIME:  

## 2013-09-25 ENCOUNTER — Encounter: Payer: Self-pay | Admitting: Family Medicine

## 2013-10-13 ENCOUNTER — Ambulatory Visit (INDEPENDENT_AMBULATORY_CARE_PROVIDER_SITE_OTHER): Payer: BC Managed Care – PPO | Admitting: Physician Assistant

## 2013-10-13 DIAGNOSIS — I10 Essential (primary) hypertension: Secondary | ICD-10-CM

## 2013-10-13 DIAGNOSIS — E669 Obesity, unspecified: Secondary | ICD-10-CM

## 2013-10-13 DIAGNOSIS — I493 Ventricular premature depolarization: Secondary | ICD-10-CM

## 2013-10-13 DIAGNOSIS — R9431 Abnormal electrocardiogram [ECG] [EKG]: Secondary | ICD-10-CM

## 2013-10-13 DIAGNOSIS — I4949 Other premature depolarization: Secondary | ICD-10-CM

## 2013-10-13 DIAGNOSIS — E785 Hyperlipidemia, unspecified: Secondary | ICD-10-CM

## 2013-10-13 NOTE — Progress Notes (Signed)
Pt needs f/u to discuss his stress test and bp please

## 2013-10-13 NOTE — Progress Notes (Signed)
Exercise Treadmill Test Charles Morales is a 52 y.o. 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  Pre-Exercise Testing Evaluation Rhythm: normal sinus  Rate: 65     Test  Exercise Tolerance Test Ordering MD: Kristeen Miss, MD  Interpreting MD: Tereso Newcomer PA-C  Unique Test No: 1  Treadmill:  1  Indication for ETT: abnormal EKG  Contraindication to ETT: No   Stress Modality: exercise - treadmill  Cardiac Imaging Performed: non   Protocol: standard Bruce - maximal  Max BP:  158/71  Max MPHR (bpm):  169 85% MPR (bpm):  144  MPHR obtained (bpm):  150 % MPHR obtained:  88  Reached 85% MPHR (min:sec):  5:28 Total Exercise Time (min-sec):  6:20  Workload in METS:  7.4 Borg Scale: 17  Reason ETT Terminated:  patient's desire to stop    ST Segment Analysis At Rest: normal ST segments - no evidence of significant ST depression With Exercise: no evidence of significant ST depression  Other Information Arrhythmia:  Yes Angina during ETT:  absent (0) Quality of ETT:  diagnostic  ETT Interpretation:  normal - no evidence of ischemia by ST analysis  Comments: Fair exercise capacity. No chest pain. Normal BP response to exercise. No ST-T changes to suggest ischemia.  There was frequent ventricular ectopy with exercise, ventricular bigeminy and 2 brief runs (3-4 beats) of NSVT.  Recommendations: I reviewed his ECGs with Dr. Delane Ginger (D[facility]). PVCs and NSVT is a non-specific finding. No ischemic changes noted.   He requires no further ischemic testing. He would benefit from the addition of a beta blocker to his medical regimen to reduce/eliminate PVCs. He will need to follow up with his PCP to adjust his current anti-hypertensive to allow room in his BP to add a  beta blocker. One could consider sending him for an Echo to evaluate LV function and LV wall thickness given hx of HTN.  We will leave this up to his PCP. Signed,  Tereso Newcomer, PA-C   10/13/2013 1:25 PM

## 2013-10-15 ENCOUNTER — Encounter: Payer: Self-pay | Admitting: Family Medicine

## 2013-10-15 NOTE — Progress Notes (Signed)
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   Hannah Beat, MD 10/15/2013, 7:25 PM

## 2013-10-16 NOTE — Progress Notes (Signed)
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

## 2013-10-16 NOTE — Progress Notes (Signed)
We should have scanned copies in Epic. 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  Copy of my letter in epic.   Hannah Beat, MD 10/16/2013, 1:42 PM

## 2013-10-21 ENCOUNTER — Encounter: Payer: Self-pay | Admitting: Family Medicine

## 2013-10-21 ENCOUNTER — Ambulatory Visit (INDEPENDENT_AMBULATORY_CARE_PROVIDER_SITE_OTHER): Payer: BC Managed Care – PPO | Admitting: Family Medicine

## 2013-10-21 VITALS — BP 128/74 | HR 64 | Temp 97.5°F | Ht 69.5 in | Wt 333.5 lb

## 2013-10-21 DIAGNOSIS — I10 Essential (primary) hypertension: Secondary | ICD-10-CM

## 2013-10-21 DIAGNOSIS — I493 Ventricular premature depolarization: Secondary | ICD-10-CM

## 2013-10-21 DIAGNOSIS — E669 Obesity, unspecified: Secondary | ICD-10-CM

## 2013-10-21 DIAGNOSIS — I4949 Other premature depolarization: Secondary | ICD-10-CM

## 2013-10-21 NOTE — Patient Instructions (Signed)
Work hard on weight loss- keep counting calories and increase exercise gradually to at least 30 minutes 5 days per week  Also - do the biggest looser program at work  Blood pressure is good  Avoid caffeine and let me know if you have palpitations

## 2013-10-21 NOTE — Progress Notes (Signed)
Subjective:    Patient ID: Charles Morales, male    DOB: [birth year], 52 y.o.   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 run of NSVT When he did this - he had not had more than 2 hours of sleep  Does drink some coffee as well    This is thought to be non specific  Cardiologist recommends beta blocker if bp is not controlled - in light of this  Echo if symptomatic  Morbidly obese Wt stable with bmi of 48  bp is stable today  No cp or palpitations or headaches or edema  No side effects to medicines  BP Readings from Last 3 Encounters:  10/21/13 128/74  09/24/13 110/70  07/13/13 118/70     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 as well  Gluc 91 GFR normal  Lipids trig 119/ HDL 30, LDL 105 Nl tsh and cbc    He takes fish oil bid  Wants to increase exercise Diet is good about 85% of the time - is making effort to eat much more fruit and veg- eats more salads than anything else He is grilling food instead of frying it - tries to stay away from fast food   He does not think he burns enough calories  Works a lot - difficult to fit exercise into schedule - also cares for his mother a lot   Is currently using My fitness pal app - keeping track closely (avg 1700-1900 cal per day)_- is allowed 2250   Review of Systems Review of Systems  Constitutional: Negative for fever, appetite change, fatigue and unexpected weight change.  Eyes: Negative for pain and visual disturbance.  Respiratory: Negative for cough and shortness of breath.   Cardiovascular: Negative for cp or palpitations   (he does not feel skipped beats at all) Gastrointestinal: Negative for nausea, diarrhea and constipation.  Genitourinary: Negative for urgency and frequency.  Skin: Negative for pallor or rash    Neurological: Negative for weakness, light-headedness, numbness and headaches.  Hematological: Negative for adenopathy. Does not bruise/bleed easily.  Psychiatric/Behavioral: Negative for dysphoric mood. The patient is not nervous/anxious.         Objective:   Physical Exam  Constitutional: He appears well-developed and well-nourished. No distress.  Morbidly obese and well app  HENT:  Head: Normocephalic and atraumatic.  Mouth/Throat: Oropharynx is clear and moist.  Eyes: Conjunctivae and EOM are normal. Pupils are equal, round, and reactive to light. No scleral icterus.  Neck: Normal range of motion. Neck supple. No JVD present. Carotid bruit is not present. No thyromegaly present.  Cardiovascular: Normal rate, regular rhythm and normal heart sounds.  Exam reveals no gallop.   Pulmonary/Chest: Effort normal and breath sounds normal. No respiratory distress. He has no wheezes. He exhibits no tenderness.  Abdominal: He exhibits no abdominal bruit.  Musculoskeletal: He exhibits no edema and no tenderness.  Lymphadenopathy:    He has no cervical adenopathy.  Neurological: He is alert. He has normal reflexes. No cranial nerve deficit. He exhibits normal muscle tone. Coordination normal.  Skin: Skin is warm and dry. No pallor.  Psychiatric: He has a normal mood and affect.          Assessment & Plan:

## 2013-10-22 DIAGNOSIS — I493 Ventricular premature depolarization: Secondary | ICD-10-CM | POA: Insufficient documentation

## 2013-10-22 NOTE — Assessment & Plan Note (Signed)
Seen with stress test and work- then ETT- overall non symptomatic Would tx with beta blocker if bp inc or if pt develops symptoms (this would necessitate dec current med likely) Cleared for work at the [employer]

## 2013-10-22 NOTE — Assessment & Plan Note (Signed)
bp in fair control at this time  No changes needed  Disc lifstyle change with low sodium diet and exercise  This is well controlled -but if PVCs/; palpitations return or increase and we needed to add beta blocker- would dec current med to prevent hypotension

## 2013-10-22 NOTE — Assessment & Plan Note (Signed)
Discussed how this problem influences overall health and the risks it imposes  Reviewed plan for weight loss with lower calorie diet (via better food choices and also portion control or program like weight watchers) and exercise building up to or more than 30 minutes 5 days per week including some aerobic activity   Is free to exercise now - and will inc that slowly Enc to keep using myfitnesspal app and poss dec calories if not loosing

## 2013-11-12 ENCOUNTER — Ambulatory Visit (INDEPENDENT_AMBULATORY_CARE_PROVIDER_SITE_OTHER): Payer: BC Managed Care – PPO | Admitting: Family Medicine

## 2013-11-12 ENCOUNTER — Ambulatory Visit (INDEPENDENT_AMBULATORY_CARE_PROVIDER_SITE_OTHER)
Admission: RE | Admit: 2013-11-12 | Discharge: 2013-11-12 | Disposition: A | Discharge status: Home / Self Care | Payer: BC Managed Care – PPO | Source: Ambulatory Visit | Attending: Family Medicine | Admitting: Family Medicine

## 2013-11-12 VITALS — BP 110/74 | HR 64 | Temp 97.5°F | Ht 69.5 in | Wt 331.5 lb

## 2013-11-12 DIAGNOSIS — M5412 Radiculopathy, cervical region: Secondary | ICD-10-CM

## 2013-11-12 DIAGNOSIS — M5010 Cervical disc disorder with radiculopathy, unspecified cervical region: Secondary | ICD-10-CM

## 2013-11-12 DIAGNOSIS — R29898 Other symptoms and signs involving the musculoskeletal system: Secondary | ICD-10-CM

## 2013-11-12 DIAGNOSIS — M25511 Pain in right shoulder: Secondary | ICD-10-CM

## 2013-11-12 DIAGNOSIS — R200 Anesthesia of skin: Secondary | ICD-10-CM

## 2013-11-12 DIAGNOSIS — M25519 Pain in unspecified shoulder: Secondary | ICD-10-CM

## 2013-11-12 DIAGNOSIS — R209 Unspecified disturbances of skin sensation: Secondary | ICD-10-CM

## 2013-11-12 MED ORDER — TRAMADOL HCL 50 MG PO TABS: 50.0000 mg | ORAL_TABLET | Freq: Four times a day (QID) | ORAL | Status: DC | PRN

## 2013-11-12 MED ORDER — AMITRIPTYLINE HCL 10 MG PO TABS: 10.0000 mg | ORAL_TABLET | Freq: Every day | ORAL | Status: DC

## 2013-11-12 MED ORDER — PREDNISONE 20 MG PO TABS: ORAL_TABLET | ORAL | Status: DC

## 2013-11-12 NOTE — Patient Instructions (Signed)
Look up on Youtube  McKenzie Protocol Cervical Spine   Follow-up with Dr. Patsy Lager in 3 weeks

## 2013-11-12 NOTE — Progress Notes (Addendum)
Date:  11/12/2013   Name:  AUSENCIO VADEN   DOB:  Jun 22, 1961   MRN:  [id number] Gender: male Age: 52 y.o.  Primary Physician:  Roxy Manns, MD   Chief Complaint: Shoulder Pain   Subjective:   History of Present Illness:  PEIRCE DEVENEY is a 52 y.o. pleasant patient who presents with the following:  Several weeks ago he initially started to have some neck pain and felt that he had more of a crepitus backward slept wrong, he has continued to progress. He then started to have some shoulder blade pain and shoulder pain, but it has subsequently progressed to now having weakness in several directions of his RIGHT upper extremity. He also has had some numbness in his distal forearm. He has never had anything like this in the past, and has no significant neck injury history, surgical history, or shoulder trauma or surgical history.  2-3 weeks, started in his right neck, then the last few weeks it has been in his right shoulder. Hard to lay flat. Taken some nsaids. Does not go away. Some nubness in the R forearm. Str may be decreased a little bit.   Shoulder movements do not hurt. Usually ibuprofen and tylenol will make it better.  Today, took 3 tylenol.  No fx or surgery  abd and erom  Patient Active Problem List   Diagnosis Date Noted  . PVC's (premature ventricular contractions) 10/22/2013  . Colon cancer screening 05/23/2012  . Routine general medical examination at a health care facility 05/20/2012  . Knee pain, right 06/08/2011  . G E R D 08/04/2007  . HYPERLIPIDEMIA 07/22/2007  . OBESITY 07/22/2007  . SMOKELESS TOBACCO ABUSE 07/22/2007  . HYPERTENSION 07/22/2007  . SLEEP APNEA 07/22/2007    Past Medical History  Diagnosis Date  . HLD (hyperlipidemia)   . HTN (hypertension)   . Morbid obesity   . Fatty liver   . Esophageal reflux   . Unspecified sleep apnea   . Tobacco use disorder   . Allergy   . Arthritis     right knee    Past Surgical History  Procedure  Laterality Date  . Knee arthroscopy      left    History   Social History  . Marital Status: Married    Spouse Name: N/A    Number of Children: 0  . 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 Smoking status: Former Smoker    Quit date: 12/31/1982  . Smokeless tobacco: Current User    Types: Snuff  . Alcohol Use: Yes     Comment: Rare  . Drug Use: No  . Sexual Activity: Not on file   Other Topics Concern  . 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 Heart attack Father 50  . Hypertension Mother   . Heart attack Mother   . Colon cancer Neg Hx   . Stomach cancer Neg Hx     No Known Allergies  Medication list has been reviewed and updated.  Review of Systems:  GEN: No fevers, chills. Nontoxic. Primarily MSK c/o today. MSK: Detailed in the HPI GI: tolerating PO intake without difficulty Neuro: detailed above Otherwise the pertinent positives of the ROS are noted above.    Objective:   Physical  Examination: BP 110/74  Pulse 64  Temp(Src) 97.5 F (36.4 C) (Oral)  Ht 5' 9.5" (1.765 m)  Wt 331 lb 8 oz (150.367 kg)  BMI 48.27 kg/m2  Ideal Body Weight: Weight in (lb) to have BMI = 25: 171.4   GEN: Well-developed,well-nourished,in no acute distress; alert,appropriate and cooperative throughout examination HEENT: Normocephalic and atraumatic without obvious abnormalities. Ears, externally no deformities PULM: Breathing comfortably in no respiratory distress EXT: No clubbing, cyanosis, or edema PSYCH: Normally interactive. Cooperative during the interview. Pleasant. Friendly and conversant. Not anxious or depressed appearing. Normal, full affect.  CERVICAL SPINE EXAM Range of motion: Flexion, extension, lateral bending, and rotation: modest restriction, with more pain going to R Pain with terminal motion:  yes Spinous Processes: NT SCM: NT Upper paracervical muscles: minimally tender Upper traps: NT There appears to be relative decreased activation of the biceps reflex on the RIGHT compared to LEFT. There is some decreased sensation on the radial aspect of the forearm on the RIGHT compared to the LEFT.  There also is some weakness relatively 4/5 in abduction and external rotation compared to the LEFT side. The rest of C5-T1 are intact from a motor standpoint.  Shoulder: R Inspection: No muscle wasting or winging Ecchymosis/edema: neg  AC joint, scapula, clavicle: NT Cervical spine: NT, full ROM Spurling's: neg Abduction: full, 4/5 Flexion: full, 5/5 IR, full, lift-off: 5/5 ER at neutral: full, 4/5 AC crossover and compression: minimal pain Neer: neg Hawkins: neg Drop Test: neg Empty Can: neg Supraspinatus insertion: NT Bicipital groove: NT Speed's: neg Yergason's: neg Sulcus sign: neg Scapular dyskinesis: none  Dg Cervical Spine Complete  11/12/2013   CLINICAL DATA:  Neck pain.  EXAM: CERVICAL SPINE  4+ VIEWS  COMPARISON:  None.  FINDINGS: Soft tissue are unremarkable. Degenerative changes of the cervical spine with disc space loss at C5-C6 and C6-C7. Disc space loss also at the C7-T1 level. Vertebral endplate osteophytes are noted these levels. Diffuse mild facet hypertrophy. No evidence of fracture or dislocation.  IMPRESSION: Degenerative changes cervical spine.  No acute abnormality.   Electronically Signed   By: Maisie Fus  Register   On: 11/12/2013 14:36    Assessment & Plan:    Cervical disc disorder with radiculopathy of cervical region - Plan: DG Cervical Spine Complete  Pain in joint, shoulder region, right  Numbness and tingling of right arm  Weakness of right arm  I suspect he may be having some nerve impingement from I nerve root exiting on the RIGHT side of cervical spine. Doubtful for a shoulder process driving his pain. Minimal to no significant impingement.  No history of trauma.  Cervical spine x-ray is more notable for multiple level degenerative changes compared to anticipated. This could be the source of his problem or disc pathology is also possible. Close followup, and if continues to have any neurological changes, advanced imaging appropriate at next office visit.  Patient Instructions  Look up on Youtube  McKenzie Protocol Cervical Spine   Follow-up with Dr. Patsy Lager in 3 weeks   Orders Today:  Orders Placed This Encounter  Procedures  . DG Cervical Spine Complete    Standing Status: Future     Number of Occurrences: 1     Standing Expiration Date: 01/12/2015    Order Specific Question:  Preferred imaging location?    Answer:  [facility]    Order Specific Question:  Reason for exam:    Answer:  neck pain, strength decrease, numbness    New medications,  updates to list, dose adjustments: Meds ordered this encounter  Medications  . predniSONE (DELTASONE) 20 MG tablet    Sig: 2 tabs po daily for 5 days, then 1 tab po for 5 days    Dispense:  15 tablet    Refill:  0  . amitriptyline (ELAVIL) 10 MG tablet    Sig: Take 1 tablet (10 mg total) by mouth at bedtime.    Dispense:  30 tablet    Refill:  2  . traMADol (ULTRAM) 50 MG tablet    Sig: Take 1 tablet (50 mg total) by mouth every 6 (six) hours as needed.    Dispense:  50 tablet    Refill:  2    Signed,  Juwaun Inskeep T. Arnice Vanepps, MD, CAQ Sports Medicine  [facility] at [facility] [address] [place] [address] Phone: [phone] Fax: [phone]  Updated Complete Medication List:   Medication List       This list is accurate as of: 11/12/13 11:59 PM.  Always use your most recent med list.               amitriptyline 10 MG tablet  Commonly known as:  ELAVIL  Take 1 tablet (10 mg total) by mouth at bedtime.     aspirin 81 MG tablet  Take 81 mg by mouth daily.     cetirizine 10 MG tablet  Commonly known as:  ZYRTEC  Take 10 mg by  mouth daily.     Cinnamon 500 MG capsule  Take 500 mg by mouth daily.     Co Q 10 100 MG Caps  Take 1 capsule by mouth daily.     fish oil-omega-3 fatty acids 1000 MG capsule  Take 2 g by mouth 2 (two) times daily.     glucosamine-chondroitin 500-400 MG tablet  Take 1 tablet by mouth daily.     lisinopril-hydrochlorothiazide 20-25 MG per tablet  Commonly known as:  PRINZIDE,ZESTORETIC  Take 1 tablet by mouth daily. * Needs to schedule appointment with Dr for any additional refills*     NON FORMULARY  CPAP at night as directed     omeprazole 40 MG capsule  Commonly known as:  PRILOSEC  Take 1 capsule (40 mg total) by mouth daily.     predniSONE 20 MG tablet  Commonly known as:  DELTASONE  2 tabs po daily for 5 days, then 1 tab po for 5 days     pyridoxine 200 MG tablet  Commonly known as:  B-6  Take 200 mg by mouth daily.     Red Yeast Rice 600 MG Caps  Take 1 capsule by mouth daily.     traMADol 50 MG tablet  Commonly known as:  ULTRAM  Take 1 tablet (50 mg total) by mouth every 6 (six) hours as needed.     vitamin B-12 500 MCG tablet  Commonly known as:  CYANOCOBALAMIN  Take 500 mcg by mouth daily.

## 2013-11-13 ENCOUNTER — Encounter: Payer: Self-pay | Admitting: Family Medicine

## 2013-12-07 ENCOUNTER — Encounter: Payer: Self-pay | Admitting: Family Medicine

## 2013-12-07 ENCOUNTER — Ambulatory Visit (INDEPENDENT_AMBULATORY_CARE_PROVIDER_SITE_OTHER): Payer: BC Managed Care – PPO | Admitting: Family Medicine

## 2013-12-07 VITALS — BP 120/80 | HR 60 | Temp 97.8°F | Ht 69.5 in | Wt 322.5 lb

## 2013-12-07 DIAGNOSIS — M25519 Pain in unspecified shoulder: Secondary | ICD-10-CM

## 2013-12-07 DIAGNOSIS — M5412 Radiculopathy, cervical region: Secondary | ICD-10-CM

## 2013-12-07 DIAGNOSIS — M25511 Pain in right shoulder: Secondary | ICD-10-CM

## 2013-12-07 DIAGNOSIS — M5010 Cervical disc disorder with radiculopathy, unspecified cervical region: Secondary | ICD-10-CM

## 2013-12-07 NOTE — Progress Notes (Signed)
Pre-visit discussion using our clinic review tool. No additional management support is needed unless otherwise documented below in the visit note.  

## 2013-12-07 NOTE — Progress Notes (Signed)
Date:  12/07/2013   Name:  Charles Morales   DOB:  01-Jan-1961   MRN:  [id number] Gender: male Age: 52 y.o.  Primary Physician:  Roxy Manns, MD   Chief Complaint: Follow-up   Subjective:   History of Present Illness:  Charles Morales is a 52 y.o. pleasant patient who presents with the following:  Cervical radiculopathy, R. He is doing much better, 90% better. Still with some shoulder pain. Str is normal. No tingling or numbness. S/p oral steroids, pain meds, mckenzie.  R shoulder.  Much better  11/12/2013 OV Several weeks ago he initially started to have some neck pain and felt that he had more of a crepitus backward slept wrong, he has continued to progress. He then started to have some shoulder blade pain and shoulder pain, but it has subsequently progressed to now having weakness in several directions of his RIGHT upper extremity. He also has had some numbness in his distal forearm. He has never had anything like this in the past, and has no significant neck injury history, surgical history, or shoulder trauma or surgical history.  2-3 weeks, started in his right neck, then the last few weeks it has been in his right shoulder. Hard to lay flat. Taken some nsaids. Does not go away. Some nubness in the R forearm. Str may be decreased a little bit.   Shoulder movements do not hurt. Usually ibuprofen and tylenol will make it better.  Today, took 3 tylenol.  No fx or surgery  abd and erom  Patient Active Problem List   Diagnosis Date Noted  . PVC's (premature ventricular contractions) 10/22/2013  . Colon cancer screening 05/23/2012  . Routine general medical examination at a health care facility 05/20/2012  . Knee pain, right 06/08/2011  . G E R D 08/04/2007  . HYPERLIPIDEMIA 07/22/2007  . OBESITY 07/22/2007  . SMOKELESS TOBACCO ABUSE 07/22/2007  . HYPERTENSION 07/22/2007  . SLEEP APNEA 07/22/2007    Past Medical History  Diagnosis Date  . HLD (hyperlipidemia)   .  HTN (hypertension)   . Morbid obesity   . Fatty liver   . Esophageal reflux   . Unspecified sleep apnea   . Tobacco use disorder   . Allergy   . Arthritis     right knee    Past Surgical History  Procedure Laterality Date  . Knee arthroscopy      left    History   Social History  . Marital Status: Married    Spouse Name: N/A    Number of Children: 0  . 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 Smoking status: Former Smoker    Quit date: 12/31/1982  . Smokeless tobacco: Current User    Types: Snuff  . Alcohol Use: Yes     Comment: Rare  . Drug Use: No  . Sexual Activity: Not on file   Other Topics Concern  . 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 Heart attack Father 22  . Hypertension Mother   . Heart attack Mother   . Colon cancer Neg Hx   . Stomach cancer Neg Hx     No Known Allergies  Medication list has been reviewed and updated.  Review of Systems:  GEN: No fevers, chills.  Nontoxic. Primarily MSK c/o today. MSK: Detailed in the HPI GI: tolerating PO intake without difficulty Neuro: detailed above Otherwise the pertinent positives of the ROS are noted above.    Objective:   Physical Examination: BP 120/80  Pulse 60  Temp(Src) 97.8 F (36.6 C) (Oral)  Ht 5' 9.5" (1.765 m)  Wt 322 lb 8 oz (146.285 kg)  BMI 46.96 kg/m2  Ideal Body Weight: Weight in (lb) to have BMI = 25: 171.4   GEN: Well-developed,well-nourished,in no acute distress; alert,appropriate and cooperative throughout examination HEENT: Normocephalic and atraumatic without obvious abnormalities. Ears, externally no deformities PULM: Breathing comfortably in no respiratory distress EXT: No clubbing, cyanosis, or edema PSYCH: Normally interactive. Cooperative during the interview. Pleasant. Friendly and conversant. Not anxious  or depressed appearing. Normal, full affect.  CERVICAL SPINE EXAM Range of motion: Flexion, extension, lateral bending, and rotation: modest restriction, with more pain going to R Pain with terminal motion: yes Spinous Processes: NT SCM: NT Upper paracervical muscles: minimally tender Upper traps: NT Sensation normal str 5/5 c5-t1 intact  Shoulder: R Inspection: No muscle wasting or winging Ecchymosis/edema: neg  AC joint, scapula, clavicle: NT Cervical spine: NT, full ROM Spurling's: neg Abduction: full, 4/5 Flexion: full, 5/5 IR, full, lift-off: 5/5 ER at neutral: full, 5/5 AC crossover and compression: minimal pain Neer: neg Hawkins: neg Drop Test: neg Empty Can: neg Supraspinatus insertion: NT Bicipital groove: NT Speed's: neg Yergason's: neg Sulcus sign: neg Scapular dyskinesis: none  Dg Cervical Spine Complete  11/12/2013   CLINICAL DATA:  Neck pain.  EXAM: CERVICAL SPINE  4+ VIEWS  COMPARISON:  None.  FINDINGS: Soft tissue are unremarkable. Degenerative changes of the cervical spine with disc space loss at C5-C6 and C6-C7. Disc space loss also at the C7-T1 level. Vertebral endplate osteophytes are noted these levels. Diffuse mild facet hypertrophy. No evidence of fracture or dislocation.  IMPRESSION: Degenerative changes cervical spine.  No acute abnormality.   Electronically Signed   By: Maisie Fus  Register   On: 11/12/2013 14:36    Assessment & Plan:    Cervical disc disorder with radiculopathy of cervical region  Pain in joint, shoulder region, right  I suspect he may be having some nerve impingement from I nerve root exiting on the RIGHT side of cervical spine. Doubtful for a shoulder process driving his pain. Minimal to no significant impingement. No history of trauma.  Much improved on f/u Given some vanderbilt impingement rehab  There are no Patient Instructions on file for this visit.  Orders Today:  No orders of the defined types were placed in this  encounter.    New medications, updates to list, dose adjustments: No orders of the defined types were placed in this encounter.    Signed,  Elpidio Galea. Delford Wingert, MD, CAQ Sports Medicine  [facility] at [facility] [address] [place] [address] Phone: [phone] Fax: [phone]  Updated Complete Medication List:   Medication List       This list is accurate as of: 12/07/13  9:39 AM.  Always use your most recent med list.               amitriptyline 10 MG tablet  Commonly known as:  ELAVIL  Take 1 tablet (10 mg total) by mouth at bedtime.     aspirin 81 MG tablet  Take 81 mg by mouth daily.     cetirizine 10 MG tablet  Commonly known as:  ZYRTEC  Take 10 mg by  mouth daily.     Cinnamon 500 MG capsule  Take 500 mg by mouth daily.     Co Q 10 100 MG Caps  Take 1 capsule by mouth daily.     fish oil-omega-3 fatty acids 1000 MG capsule  Take 2 g by mouth 2 (two) times daily.     glucosamine-chondroitin 500-400 MG tablet  Take 1 tablet by mouth daily.     lisinopril-hydrochlorothiazide 20-25 MG per tablet  Commonly known as:  PRINZIDE,ZESTORETIC  Take 1 tablet by mouth daily. * Needs to schedule appointment with Dr for any additional refills*     NON FORMULARY  CPAP at night as directed     omeprazole 40 MG capsule  Commonly known as:  PRILOSEC  Take 1 capsule (40 mg total) by mouth daily.     pyridoxine 200 MG tablet  Commonly known as:  B-6  Take 200 mg by mouth daily.     Red Yeast Rice 600 MG Caps  Take 1 capsule by mouth daily.     traMADol 50 MG tablet  Commonly known as:  ULTRAM  Take 1 tablet (50 mg total) by mouth every 6 (six) hours as needed.     vitamin B-12 500 MCG tablet  Commonly known as:  CYANOCOBALAMIN  Take 500 mcg by mouth daily.

## 2014-08-13 ENCOUNTER — Other Ambulatory Visit: Payer: Self-pay | Admitting: Family Medicine

## 2014-08-13 NOTE — Telephone Encounter (Signed)
Electronic refill request, no recent/future appt., please advise  

## 2014-08-13 NOTE — Telephone Encounter (Signed)
Please refill for 6 months  Schedule f/u in about 6 mo

## 2015-02-16 ENCOUNTER — Other Ambulatory Visit: Payer: Self-pay | Admitting: Family Medicine

## 2015-04-16 ENCOUNTER — Other Ambulatory Visit: Payer: Self-pay | Admitting: Family Medicine

## 2015-04-18 NOTE — Telephone Encounter (Signed)
Please schedule a f/u appt and refill until then

## 2015-04-18 NOTE — Telephone Encounter (Signed)
Received refill request electronically from pharmacy. Last office visit 12/07/13/acute. Is it okay to refill medication?

## 2015-04-19 NOTE — Telephone Encounter (Signed)
Spoke to patient.  Follow up scheduled for 04/26.  Refill sent to pharmacy.

## 2015-04-26 ENCOUNTER — Ambulatory Visit (INDEPENDENT_AMBULATORY_CARE_PROVIDER_SITE_OTHER): Payer: BLUE CROSS/BLUE SHIELD | Admitting: Family Medicine

## 2015-04-26 ENCOUNTER — Encounter: Payer: Self-pay | Admitting: Family Medicine

## 2015-04-26 VITALS — BP 122/78 | HR 62 | Temp 98.5°F | Ht 69.5 in | Wt 362.2 lb

## 2015-04-26 DIAGNOSIS — E785 Hyperlipidemia, unspecified: Secondary | ICD-10-CM

## 2015-04-26 DIAGNOSIS — I10 Essential (primary) hypertension: Secondary | ICD-10-CM | POA: Diagnosis not present

## 2015-04-26 LAB — LIPID PANEL
CHOLESTEROL: 134 mg/dL (ref 0–200)
HDL: 32.6 mg/dL — ABNORMAL LOW (ref >39.00)
LDL Cholesterol: 86 mg/dL (ref 0–99)
NonHDL: 101.4
TRIGLYCERIDES: 78 mg/dL (ref 0.0–149.0)
Total CHOL/HDL Ratio: 4
VLDL: 15.6 mg/dL (ref 0.0–40.0)

## 2015-04-26 LAB — COMPREHENSIVE METABOLIC PANEL
ALT: 26 U/L (ref 0–53)
AST: 20 U/L (ref 0–37)
Albumin: 4.2 g/dL (ref 3.5–5.2)
Alkaline Phosphatase: 49 U/L (ref 39–117)
BILIRUBIN TOTAL: 0.6 mg/dL (ref 0.2–1.2)
BUN: 16 mg/dL (ref 6–23)
CO2: 31 mEq/L (ref 19–32)
Calcium: 9.7 mg/dL (ref 8.4–10.5)
Chloride: 101 mEq/L (ref 96–112)
Creatinine, Ser: 0.92 mg/dL (ref 0.40–1.50)
GFR: 91.58 mL/min (ref >60.00)
Glucose, Bld: 100 mg/dL — ABNORMAL HIGH (ref 70–99)
POTASSIUM: 4.2 meq/L (ref 3.5–5.1)
Sodium: 138 mEq/L (ref 135–145)
TOTAL PROTEIN: 7.2 g/dL (ref 6.0–8.3)

## 2015-04-26 LAB — CBC WITH DIFFERENTIAL/PLATELET
BASOS ABS: 0 10*3/uL (ref 0.0–0.1)
Basophils Relative: 0.3 % (ref 0.0–3.0)
EOS ABS: 0.1 10*3/uL (ref 0.0–0.7)
Eosinophils Relative: 1.5 % (ref 0.0–5.0)
HEMATOCRIT: 47.3 % (ref 39.0–52.0)
HEMOGLOBIN: 16.1 g/dL (ref 13.0–17.0)
Lymphocytes Relative: 18.9 % (ref 12.0–46.0)
Lymphs Abs: 1.1 10*3/uL (ref 0.7–4.0)
MCHC: 34 g/dL (ref 30.0–36.0)
MCV: 88.4 fl (ref 78.0–100.0)
MONOS PCT: 9.6 % (ref 3.0–12.0)
Monocytes Absolute: 0.6 10*3/uL (ref 0.1–1.0)
NEUTROS PCT: 69.7 % (ref 43.0–77.0)
Neutro Abs: 4.2 10*3/uL (ref 1.4–7.7)
Platelets: 166 10*3/uL (ref 150.0–400.0)
RBC: 5.35 Mil/uL (ref 4.22–5.81)
RDW: 14.5 % (ref 11.5–15.5)
WBC: 6.1 10*3/uL (ref 4.0–10.5)

## 2015-04-26 LAB — TSH: TSH: 0.92 u[IU]/mL (ref 0.35–4.50)

## 2015-04-26 MED ORDER — LISINOPRIL-HYDROCHLOROTHIAZIDE 20-25 MG PO TABS: 1.0000 | ORAL_TABLET | Freq: Every day | ORAL | Status: DC

## 2015-04-26 MED ORDER — OMEPRAZOLE 40 MG PO CPDR: 40.0000 mg | DELAYED_RELEASE_CAPSULE | Freq: Every day | ORAL | Status: DC

## 2015-04-26 NOTE — Progress Notes (Signed)
Pre visit review using our clinic review tool, if applicable. No additional management support is needed unless otherwise documented below in the visit note. 

## 2015-04-26 NOTE — Progress Notes (Signed)
Subjective:    Patient ID: Charles Morales, male    DOB: 1961/05/28, 54 y.o.   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 shift - and eating more normal meals but not snacking Also eating a later dinner  Some fried foods- is "really" trying to restrict  Too much pasta  Aiming for more vegetables  Some hamburgers /hot dogs   Was eating much better things on night shift (a partnership with the people he worked with)-different now   A little exercise - not as much as he would like to  Ankle gives him a problem   Work is overall going well  Would consider night shift again   bmi is 10 Morbid obese  bp is stable today  No cp or palpitations or headaches or edema  No side effects to medicines  BP Readings from Last 3 Encounters:  04/26/15 122/78  12/07/13 120/80  11/12/13 110/74      Patient Active Problem List   Diagnosis Date Noted  . PVC's (premature ventricular contractions) 10/22/2013  . Colon cancer screening 05/23/2012  . Routine general medical examination at a health care facility 05/20/2012  . Knee pain, right 06/08/2011  . G E R D 08/04/2007  . Hyperlipidemia 07/22/2007  . Morbid obesity 07/22/2007  . SMOKELESS TOBACCO ABUSE 07/22/2007  . Essential hypertension 07/22/2007  . SLEEP APNEA 07/22/2007   Past Medical History  Diagnosis Date  . HLD (hyperlipidemia)   . HTN (hypertension)   . Morbid obesity   . Fatty liver   . Esophageal reflux   . Unspecified sleep apnea   . Tobacco use disorder   . Allergy   . Arthritis     right knee   Past Surgical History  Procedure Laterality Date  . Knee arthroscopy      left   History  Substance Use Topics  . Smoking status: Former Smoker    Quit date: 12/31/1982  . Smokeless tobacco: Current User    Types: Snuff  . Alcohol Use: 0.0 oz/week    0 Standard drinks or equivalent per week    Comment: Rare   Family History  Problem Relation Age of Onset  . Heart attack Father 44  . Hypertension Mother   . Heart attack Mother   . Colon cancer Neg Hx   . Stomach cancer Neg Hx    No Known Allergies Current Outpatient Prescriptions on File Prior to Visit  Medication Sig Dispense Refill  . amitriptyline (ELAVIL) 10 MG tablet Take 1 tablet (10 mg total) by mouth at bedtime. 30 tablet 2  . aspirin 81 MG tablet Take 81 mg by mouth daily.      . cetirizine (ZYRTEC) 10 MG tablet Take 10 mg by mouth daily.    . Cinnamon 500 MG capsule Take 500 mg by mouth daily.      . Coenzyme Q10 (CO Q 10) 100 MG CAPS Take 1 capsule by mouth daily.      . fish oil-omega-3 fatty acids 1000 MG capsule Take 2 g by mouth 2 (two) times daily.      Marland Kitchen glucosamine-chondroitin 500-400 MG tablet Take 1 tablet by mouth daily.      Marland Kitchen lisinopril-hydrochlorothiazide (PRINZIDE,ZESTORETIC) 20-25 MG per tablet TAKE 1 TABLET EVERY DAY 30 tablet 0  . NON FORMULARY CPAP at night as directed     .  omeprazole (PRILOSEC) 40 MG capsule TAKE ONE CAPSULE EVERY DAY 90 capsule 1  . pyridoxine (B-6) 200 MG tablet Take 200 mg by mouth daily.      . Red Yeast Rice 600 MG CAPS Take 1 capsule by mouth daily.      . traMADol (ULTRAM) 50 MG tablet Take 1 tablet (50 mg total) by mouth every 6 (six) hours as needed. 50 tablet 2  . vitamin B-12 (CYANOCOBALAMIN) 500 MCG tablet Take 500 mcg by mouth daily.       No current facility-administered medications on file prior to visit.        Review of Systems    Review of Systems  Constitutional: Negative for fever, appetite change, fatigue and unexpected weight change.  Eyes: Negative for pain and visual disturbance.  Respiratory: Negative for cough and shortness of breath.  pos for dec exercise tolerance with wt gain Cardiovascular: Negative for cp or palpitations    Gastrointestinal: Negative for nausea, diarrhea and constipation.  Genitourinary: Negative for urgency and  frequency.  Skin: Negative for pallor or rash   Neurological: Negative for weakness, light-headedness, numbness and headaches.  Hematological: Negative for adenopathy. Does not bruise/bleed easily.  Psychiatric/Behavioral: Negative for dysphoric mood. The patient is not nervous/anxious.      Objective:   Physical Exam  Constitutional: He appears well-developed and well-nourished. No distress.  Morbidly obese and well appearing   HENT:  Head: Normocephalic and atraumatic.  Mouth/Throat: Oropharynx is clear and moist.  Eyes: Conjunctivae and EOM are normal. Pupils are equal, round, and reactive to light.  Neck: Normal range of motion. Neck supple. No JVD present. Carotid bruit is not present. No thyromegaly present.  Cardiovascular: Normal rate, regular rhythm, normal heart sounds and intact distal pulses.  Exam reveals no gallop.   Pulmonary/Chest: Effort normal and breath sounds normal. No respiratory distress. He has no wheezes. He has no rales.  No crackles  Abdominal: Soft. Bowel sounds are normal. He exhibits no distension, no abdominal bruit and no mass. There is no tenderness.  Musculoskeletal: He exhibits no edema or tenderness.  Lymphadenopathy:    He has no cervical adenopathy.  Neurological: He is alert. He has normal reflexes.  Skin: Skin is warm and dry. No rash noted.  Psychiatric: He has a normal mood and affect.          Assessment & Plan:   Problem List Items Addressed This Visit      Cardiovascular and Mediastinum   Essential hypertension - Primary    bp in fair control at this time - after being lost to f/u BP Readings from Last 1 Encounters:  04/26/15 122/78   No changes needed Disc lifstyle change with low sodium diet and exercise  Labs today       Relevant Medications   lisinopril-hydrochlorothiazide (PRINZIDE,ZESTORETIC) 20-25 MG per tablet   Other Relevant Orders   CBC with Differential/Platelet (Completed)   Comprehensive metabolic panel  (Completed)   TSH (Completed)   Lipid panel (Completed)     Other   Hyperlipidemia    Disc goals for lipids and reasons to control them Rev labs with pt- from last check , has been lost to f/u for a while  Rev low sat fat diet in detail  Lipid panel and chem today      Relevant Medications   lisinopril-hydrochlorothiazide (PRINZIDE,ZESTORETIC) 20-25 MG per tablet   Other Relevant Orders   Lipid panel (Completed)   Morbid obesity    40  lb wt gain since last time Pt is motivated to get back on track Discussed how this problem influences overall health and the risks it imposes  Reviewed plan for weight loss with lower calorie diet (via better food choices and also portion control or program like weight watchers) and exercise building up to or more than 30 minutes 5 days per week including some aerobic activity    He may be able to switch back to the night shift that was better for him

## 2015-04-26 NOTE — Patient Instructions (Signed)
Labs today  BP looks good Get back to a better lifestyle for weight loss  Aim for 30 minutes of exercise 5 days per week  MYFITNESSPAL is a great app to loose weight - think about it  Follow up in 6 months

## 2015-04-27 NOTE — Assessment & Plan Note (Signed)
40 lb wt gain since last time Pt is motivated to get back on track Discussed how this problem influences overall health and the risks it imposes  Reviewed plan for weight loss with lower calorie diet (via better food choices and also portion control or program like weight watchers) and exercise building up to or more than 30 minutes 5 days per week including some aerobic activity    He may be able to switch back to the night shift that was better for him

## 2015-04-27 NOTE — Assessment & Plan Note (Signed)
Disc goals for lipids and reasons to control them Rev labs with pt- from last check , has been lost to f/u for a while  Rev low sat fat diet in detail  Lipid panel and chem today

## 2015-04-27 NOTE — Assessment & Plan Note (Signed)
bp in fair control at this time - after being lost to f/u BP Readings from Last 1 Encounters:  04/26/15 122/78   No changes needed Disc lifstyle change with low sodium diet and exercise  Labs today

## 2015-10-14 ENCOUNTER — Ambulatory Visit (INDEPENDENT_AMBULATORY_CARE_PROVIDER_SITE_OTHER): Payer: 59 | Admitting: Family Medicine

## 2015-10-14 ENCOUNTER — Ambulatory Visit (INDEPENDENT_AMBULATORY_CARE_PROVIDER_SITE_OTHER)
Admission: RE | Admit: 2015-10-14 | Discharge: 2015-10-14 | Disposition: A | Discharge status: Home / Self Care | Payer: 59 | Source: Ambulatory Visit | Attending: Family Medicine | Admitting: Family Medicine

## 2015-10-14 ENCOUNTER — Encounter: Payer: Self-pay | Admitting: Family Medicine

## 2015-10-14 VITALS — BP 124/72 | HR 69 | Temp 98.1°F | Ht 69.5 in | Wt 353.5 lb

## 2015-10-14 DIAGNOSIS — E785 Hyperlipidemia, unspecified: Secondary | ICD-10-CM

## 2015-10-14 DIAGNOSIS — Z23 Encounter for immunization: Secondary | ICD-10-CM

## 2015-10-14 DIAGNOSIS — I10 Essential (primary) hypertension: Secondary | ICD-10-CM | POA: Diagnosis not present

## 2015-10-14 DIAGNOSIS — M25572 Pain in left ankle and joints of left foot: Secondary | ICD-10-CM | POA: Diagnosis not present

## 2015-10-14 DIAGNOSIS — Z636 Dependent relative needing care at home: Secondary | ICD-10-CM

## 2015-10-14 MED ORDER — ALPRAZOLAM 0.5 MG PO TABS: 0.5000 mg | ORAL_TABLET | Freq: Every day | ORAL | Status: DC | PRN

## 2015-10-14 NOTE — Progress Notes (Signed)
Pre visit review using our clinic review tool, if applicable. No additional management support is needed unless otherwise documented below in the visit note. 

## 2015-10-14 NOTE — Progress Notes (Signed)
Subjective:    Patient ID: Charles Morales, male    DOB: 1961-09-18, 54 y.o.   MRN: [id number]  HPI Here for f/u of chronic health problems   Wt is down 9 lb with bmi of 51 In morbid obese category He stopped fried foods and cut back on sugar  More water  Walking when he can (with his bad knee) - uses treadmill at work during lunch  On off days -is on his feet    Having an ankle problem - happens when he stands or walks -about 6-8 mo  Worse when he initiates movement and warms up the more he walks - lateral pain  Not swollen There is a knot on the side of it  Takes tylenol/ nuprin  Tried ice and heat  No injury or trauma   Flu shot - will do today   bp is stable today  No cp or palpitations or headaches or edema  No side effects to medicines  BP Readings from Last 3 Encounters:  10/14/15 124/72  04/26/15 122/78  12/07/13 120/80     Hyperlipidemia Lab Results  Component Value Date   CHOL 134 04/26/2015   HDL 32.60* 04/26/2015   LDLCALC 86 04/26/2015   TRIG 78.0 04/26/2015   CHOLHDL 4 04/26/2015   Taking red yeast rice HDL is low   Having some labile emotions with caregiving-tired and anxious  Trouble sleeping at times  ? If anything would help  Unsure about counseling   Patient Active Problem List   Diagnosis Date Noted  . Left ankle pain 10/14/2015  . Caregiver stress 10/14/2015  . PVC's (premature ventricular contractions) 10/22/2013  . Colon cancer screening 05/23/2012  . Routine general medical examination at a health care facility 05/20/2012  . Knee pain, right 06/08/2011  . G E R D 08/04/2007  . Hyperlipidemia 07/22/2007  . Morbid obesity ([place]) 07/22/2007  . SMOKELESS TOBACCO ABUSE 07/22/2007  . Essential hypertension 07/22/2007  . SLEEP APNEA 07/22/2007   Past Medical History  Diagnosis Date  . HLD (hyperlipidemia)   . HTN (hypertension)   . Morbid obesity ([place])   . Fatty liver   . Esophageal reflux   . Unspecified sleep apnea   .  Tobacco use disorder   . Allergy   . Arthritis     right knee   Past Surgical History  Procedure Laterality Date  . Knee arthroscopy      left   Social History  Substance Use Topics  . Smoking status: Former Smoker    Quit date: 12/31/1982  . Smokeless tobacco: Current User    Types: Snuff  . Alcohol Use: 0.0 oz/week    0 Standard drinks or equivalent per week     Comment: Rare   Family History  Problem Relation Age of Onset  . Heart attack Father 86  . Hypertension Mother   . Heart attack Mother   . Colon cancer Neg Hx   . Stomach cancer Neg Hx    No Known Allergies Current Outpatient Prescriptions on File Prior to Visit  Medication Sig Dispense Refill  . aspirin 81 MG tablet Take 81 mg by mouth daily.      . cetirizine (ZYRTEC) 10 MG tablet Take 10 mg by mouth daily.    . Cinnamon 500 MG capsule Take 500 mg by mouth daily.      . Coenzyme Q10 (CO Q 10) 100 MG CAPS Take 1 capsule by mouth daily.      Marland Kitchen  fish oil-omega-3 fatty acids 1000 MG capsule Take 2 g by mouth 2 (two) times daily.      Marland Kitchen glucosamine-chondroitin 500-400 MG tablet Take 1 tablet by mouth daily.      Marland Kitchen lisinopril-hydrochlorothiazide (PRINZIDE,ZESTORETIC) 20-25 MG per tablet Take 1 tablet by mouth daily. 90 tablet 3  . NON FORMULARY CPAP at night as directed     . omeprazole (PRILOSEC) 40 MG capsule Take 1 capsule (40 mg total) by mouth daily. 90 capsule 3  . pyridoxine (B-6) 200 MG tablet Take 200 mg by mouth daily.      . Red Yeast Rice 600 MG CAPS Take 1 capsule by mouth daily.      . traMADol (ULTRAM) 50 MG tablet Take 1 tablet (50 mg total) by mouth every 6 (six) hours as needed. 50 tablet 2  . vitamin B-12 (CYANOCOBALAMIN) 500 MCG tablet Take 500 mcg by mouth daily.       No current facility-administered medications on file prior to visit.    Review of Systems Review of Systems  Constitutional: Negative for fever, appetite change,  and unexpected weight change.  Eyes: Negative for pain and  visual disturbance.  Respiratory: Negative for cough and shortness of breath.   Cardiovascular: Negative for cp or palpitations    Gastrointestinal: Negative for nausea, diarrhea and constipation.  Genitourinary: Negative for urgency and frequency.  Skin: Negative for pallor or rash   Neurological: Negative for weakness, light-headedness, numbness and headaches.  Hematological: Negative for adenopathy. Does not bruise/bleed easily.  Psychiatric/Behavioral: Negative for dysphoric mood. The patient is nervous/anxious.  neg for SI       Objective:   Physical Exam  Constitutional: He appears well-developed and well-nourished. No distress.  Morbidly obese and well appearing   HENT:  Head: Normocephalic and atraumatic.  Mouth/Throat: Oropharynx is clear and moist.  Eyes: Conjunctivae and EOM are normal. Pupils are equal, round, and reactive to light.  Neck: Normal range of motion. Neck supple. No JVD present. Carotid bruit is not present. No thyromegaly present.  Cardiovascular: Normal rate, regular rhythm, normal heart sounds and intact distal pulses.  Exam reveals no gallop.   Pulmonary/Chest: Effort normal and breath sounds normal. No respiratory distress. He has no wheezes. He has no rales.  No crackles  Abdominal: Soft. Bowel sounds are normal. He exhibits no distension, no abdominal bruit and no mass. There is no tenderness.  Musculoskeletal: He exhibits tenderness. He exhibits no edema.  Lateral tenderness of L ankle and achilles Nl rom No crepitus No swelling or skin change  Favors R foot with gait   Lymphadenopathy:    He has no cervical adenopathy.  Neurological: He is alert. He has normal reflexes. No cranial nerve deficit. He exhibits normal muscle tone. Coordination normal.  Skin: Skin is warm and dry. No rash noted.  Psychiatric: His speech is normal and behavior is normal. Thought content normal. His mood appears anxious. His affect is not blunt, not labile and not  inappropriate. Thought content is not paranoid. He does not exhibit a depressed mood. He expresses no homicidal and no suicidal ideation.  Pleasant  Discusses stressors candidly          Assessment & Plan:   Problem List Items Addressed This Visit      Cardiovascular and Mediastinum   Essential hypertension - Primary    bp in fair control at this time  BP Readings from Last 1 Encounters:  10/14/15 124/72   No changes needed Disc  lifstyle change with low sodium diet and exercise  Last labs reviewed         Other   Caregiver stress    Reviewed stressors/ coping techniques/symptoms/ support sources/ tx options and side effects in detail today Given xanax for prn use - warned of sedation and habit -aware Would disc more long term tx like ssri if needed Suggested counseling Overall doing well given situation       Hyperlipidemia    Disc goals for lipids and reasons to control them Rev labs with pt Rev low sat fat diet in detail  On red yeast rice Working on HDL - exercise as tolerated       Left ankle pain    Left ankle and foot pain - worse with wt bearing in obese male xr today to r/o stress fx  Disc ankle exercises/ice Verdis Frederickson Joeseph Amor  Pend result - will make plan for tx       Relevant Orders   DG Ankle Complete Left (Completed)   DG Foot Complete Left (Completed)   Morbid obesity ([place])    Discussed how this problem influences overall health and the risks it imposes  Reviewed plan for weight loss with lower calorie diet (via better food choices and also portion control or program like weight watchers) and exercise building up to or more than 30 minutes 5 days per week including some aerobic activity   Pt will work on finding low impact exercise like recumbent bike - with goal of 5 d per week   Commended on wt loss so far         Other Visit Diagnoses    Encounter for immunization

## 2015-10-14 NOTE — Patient Instructions (Addendum)
Switch to recumbent exercise bike instead of walking --- low impact exercise   Try to exercise 30 minutes a day  Keep working on weight loss with reduced calorie diet  Xray of left ankle and foot today Do get some counseling from hospice  Here is xanax for emergencies - when not working or driving  Try to take care of yourself   Follow up in 6 months for annual exam

## 2015-10-16 NOTE — Assessment & Plan Note (Signed)
Reviewed stressors/ coping techniques/symptoms/ support sources/ tx options and side effects in detail today Given xanax for prn use - warned of sedation and habit -aware Would disc more long term tx like ssri if needed Suggested counseling Overall doing well given situation

## 2015-10-16 NOTE — Assessment & Plan Note (Signed)
bp in fair control at this time  BP Readings from Last 1 Encounters:  10/14/15 124/72   No changes needed Disc lifstyle change with low sodium diet and exercise  Last labs reviewed

## 2015-10-16 NOTE — Assessment & Plan Note (Addendum)
Discussed how this problem influences overall health and the risks it imposes  Reviewed plan for weight loss with lower calorie diet (via better food choices and also portion control or program like weight watchers) and exercise building up to or more than 30 minutes 5 days per week including some aerobic activity   Pt will work on finding low impact exercise like recumbent bike - with goal of 5 d per week   Commended on wt loss so far

## 2015-10-16 NOTE — Assessment & Plan Note (Signed)
Disc goals for lipids and reasons to control them Rev labs with pt Rev low sat fat diet in detail  On red yeast rice Working on HDL - exercise as tolerated

## 2015-10-16 NOTE — Assessment & Plan Note (Signed)
Left ankle and foot pain - worse with wt bearing in obese male xr today to r/o stress fx  Disc ankle exercises/ice Charles Morales Charles Morales  Pend result - will make plan for tx

## 2015-10-28 ENCOUNTER — Ambulatory Visit: Payer: BLUE CROSS/BLUE SHIELD | Admitting: Family Medicine

## 2016-04-01 ENCOUNTER — Telehealth: Payer: Self-pay | Admitting: Family Medicine

## 2016-04-01 DIAGNOSIS — R739 Hyperglycemia, unspecified: Secondary | ICD-10-CM

## 2016-04-01 DIAGNOSIS — I10 Essential (primary) hypertension: Secondary | ICD-10-CM

## 2016-04-01 DIAGNOSIS — R7303 Prediabetes: Secondary | ICD-10-CM | POA: Insufficient documentation

## 2016-04-01 DIAGNOSIS — E785 Hyperlipidemia, unspecified: Secondary | ICD-10-CM

## 2016-04-01 NOTE — Telephone Encounter (Signed)
-----   Message from Ellamae Sia sent at 03/30/2016 10:56 AM EDT ----- Regarding: lab orders for Monday, April 10,17 Lab orders for a 6 month follow up appt

## 2016-04-09 ENCOUNTER — Other Ambulatory Visit (INDEPENDENT_AMBULATORY_CARE_PROVIDER_SITE_OTHER): Payer: 59

## 2016-04-09 DIAGNOSIS — E785 Hyperlipidemia, unspecified: Secondary | ICD-10-CM | POA: Diagnosis not present

## 2016-04-09 DIAGNOSIS — I10 Essential (primary) hypertension: Secondary | ICD-10-CM | POA: Diagnosis not present

## 2016-04-09 DIAGNOSIS — R739 Hyperglycemia, unspecified: Secondary | ICD-10-CM | POA: Diagnosis not present

## 2016-04-09 LAB — CBC WITH DIFFERENTIAL/PLATELET
BASOS PCT: 0.3 % (ref 0.0–3.0)
Basophils Absolute: 0 10*3/uL (ref 0.0–0.1)
EOS PCT: 2 % (ref 0.0–5.0)
Eosinophils Absolute: 0.2 10*3/uL (ref 0.0–0.7)
HCT: 46.3 % (ref 39.0–52.0)
Hemoglobin: 15.7 g/dL (ref 13.0–17.0)
Lymphocytes Relative: 23.2 % (ref 12.0–46.0)
Lymphs Abs: 2.2 10*3/uL (ref 0.7–4.0)
MCHC: 33.8 g/dL (ref 30.0–36.0)
MCV: 88.6 fl (ref 78.0–100.0)
MONO ABS: 0.8 10*3/uL (ref 0.1–1.0)
Monocytes Relative: 8.8 % (ref 3.0–12.0)
NEUTROS PCT: 65.7 % (ref 43.0–77.0)
Neutro Abs: 6.1 10*3/uL (ref 1.4–7.7)
PLATELETS: 200 10*3/uL (ref 150.0–400.0)
RBC: 5.23 Mil/uL (ref 4.22–5.81)
RDW: 14.1 % (ref 11.5–15.5)
WBC: 9.3 10*3/uL (ref 4.0–10.5)

## 2016-04-09 LAB — COMPREHENSIVE METABOLIC PANEL
ALK PHOS: 50 U/L (ref 39–117)
ALT: 32 U/L (ref 0–53)
AST: 21 U/L (ref 0–37)
Albumin: 4 g/dL (ref 3.5–5.2)
BUN: 15 mg/dL (ref 6–23)
CHLORIDE: 103 meq/L (ref 96–112)
CO2: 29 meq/L (ref 19–32)
Calcium: 9.5 mg/dL (ref 8.4–10.5)
Creatinine, Ser: 0.94 mg/dL (ref 0.40–1.50)
GFR: 89.01 mL/min (ref >60.00)
GLUCOSE: 115 mg/dL — AB (ref 70–99)
POTASSIUM: 3.8 meq/L (ref 3.5–5.1)
SODIUM: 140 meq/L (ref 135–145)
TOTAL PROTEIN: 7.4 g/dL (ref 6.0–8.3)
Total Bilirubin: 0.5 mg/dL (ref 0.2–1.2)

## 2016-04-09 LAB — HEMOGLOBIN A1C: Hgb A1c MFr Bld: 5.8 % (ref 4.6–6.5)

## 2016-04-09 LAB — LIPID PANEL
CHOL/HDL RATIO: 4
Cholesterol: 136 mg/dL (ref 0–200)
HDL: 31.4 mg/dL — AB (ref >39.00)
LDL Cholesterol: 79 mg/dL (ref 0–99)
NonHDL: 104.11
TRIGLYCERIDES: 126 mg/dL (ref 0.0–149.0)
VLDL: 25.2 mg/dL (ref 0.0–40.0)

## 2016-04-09 LAB — TSH: TSH: 1.19 u[IU]/mL (ref 0.35–4.50)

## 2016-04-16 ENCOUNTER — Ambulatory Visit: Payer: 59 | Admitting: Family Medicine

## 2016-04-23 ENCOUNTER — Ambulatory Visit: Payer: 59 | Admitting: Family Medicine

## 2016-04-29 ENCOUNTER — Other Ambulatory Visit: Payer: Self-pay | Admitting: Family Medicine

## 2016-05-01 ENCOUNTER — Ambulatory Visit (INDEPENDENT_AMBULATORY_CARE_PROVIDER_SITE_OTHER): Payer: 59 | Admitting: Family Medicine

## 2016-05-01 ENCOUNTER — Encounter: Payer: Self-pay | Admitting: Family Medicine

## 2016-05-01 VITALS — BP 126/78 | HR 72 | Temp 98.4°F | Ht 69.5 in | Wt 358.5 lb

## 2016-05-01 DIAGNOSIS — E785 Hyperlipidemia, unspecified: Secondary | ICD-10-CM | POA: Diagnosis not present

## 2016-05-01 DIAGNOSIS — F509 Eating disorder, unspecified: Secondary | ICD-10-CM | POA: Insufficient documentation

## 2016-05-01 DIAGNOSIS — R739 Hyperglycemia, unspecified: Secondary | ICD-10-CM | POA: Diagnosis not present

## 2016-05-01 DIAGNOSIS — I10 Essential (primary) hypertension: Secondary | ICD-10-CM

## 2016-05-01 DIAGNOSIS — F4321 Adjustment disorder with depressed mood: Secondary | ICD-10-CM | POA: Insufficient documentation

## 2016-05-01 MED ORDER — ALPRAZOLAM 0.5 MG PO TABS: 0.5000 mg | ORAL_TABLET | Freq: Every day | ORAL | Status: DC | PRN

## 2016-05-01 NOTE — Patient Instructions (Addendum)
Keep working on better coping mechanisms (to stop emotional eating) If you want to see a counselor in the future let me know  Labs are stable Blood pressure is stable  Use xanax sparingly when you need it  Get started with an exercise program as planned  Follow up with me in 6 months with labs prior

## 2016-05-01 NOTE — Assessment & Plan Note (Signed)
Fairly stable despite wt change Lab Results  Component Value Date   HGBA1C 5.8 04/09/2016   Disc low glycemic diet and exercise and wt loss to prevent DM

## 2016-05-01 NOTE — Assessment & Plan Note (Signed)
Despite weight gain -this is still well controlled bp in fair control at this time  BP Readings from Last 1 Encounters:  05/01/16 126/78   No changes needed Disc lifstyle change with low sodium diet and exercise   Labs reviewed F/u 6 mo

## 2016-05-01 NOTE — Assessment & Plan Note (Signed)
Disc goals for lipids and reasons to control them Rev labs with pt Rev low sat fat diet in detail Stable with red yeast rice/diet/coenzyme Q10 and fish oil  Continue to watch F/u 62mo

## 2016-05-01 NOTE — Assessment & Plan Note (Signed)
Discussed how this problem influences overall health and the risks it imposes  Reviewed plan for weight loss with lower calorie diet (via better food choices and also portion control or program like weight watchers) and exercise building up to or more than 30 minutes 5 days per week including some aerobic activity   Disc emotional eating issues and options for treating that  Exercise plan is in place

## 2016-05-01 NOTE — Progress Notes (Signed)
Subjective:    Patient ID: EVEN POLIO, male    DOB: 14-Sep-1961, 55 y.o.   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   Is starting to take better care of himself now  Lost his mother in the fall - his self care fell off completely (eating became a comfort to him) --wt went up to 74 and now he is loosing again  Weights here correlate with this   He did make better eating choices - unsweetened fruit and veggies   Coping mechanisms-working on that  Walks around yard - tries to get his mind in another place (which equals more exercise) Also drinking a glass of water helps  Also talks to a good friend -talks 2-3 times per week  Also listens to music  Wife is a good support also   Good weeks and bad weeks with grief   Now has plan to start working out 2-3 times per wk with work friends   Morbid obesity Wt is up 5 lb with bmi of 52.2 by our scales today   bp is stable today  No cp or palpitations or headaches or edema  No side effects to medicines  BP Readings from Last 3 Encounters:  05/01/16 126/78  10/14/15 124/72  04/26/15 122/78     On lisinopril hct   Chemistry      Component Value Date/Time   NA 140 04/09/2016 0820   K 3.8 04/09/2016 0820   CL 103 04/09/2016 0820   CO2 29 04/09/2016 0820   BUN 15 04/09/2016 0820   CREATININE 0.94 04/09/2016 0820      Component Value Date/Time   CALCIUM 9.5 04/09/2016 0820   ALKPHOS 50 04/09/2016 0820   AST 21 04/09/2016 0820   ALT 32 04/09/2016 0820   BILITOT 0.5 04/09/2016 0820      Cholesterol Takes red yeast rice and fish oil and co enzyme Q 10 Lab Results  Component Value Date   CHOL 136 04/09/2016   CHOL 134 04/26/2015   CHOL 137 07/13/2013   Lab Results  Component Value Date   HDL 31.40* 04/09/2016   HDL 32.60* 04/26/2015   HDL 28.80* 07/13/2013   Lab Results  Component Value Date   LDLCALC 79  04/09/2016   LDLCALC 86 04/26/2015   LDLCALC 87 07/13/2013   Lab Results  Component Value Date   TRIG 126.0 04/09/2016   TRIG 78.0 04/26/2015   TRIG 106.0 07/13/2013   Lab Results  Component Value Date   CHOLHDL 4 04/09/2016   CHOLHDL 4 04/26/2015   CHOLHDL 5 07/13/2013   No results found for: LDLDIRECT This did not change appreciably  Still takes a baby asa once per day    Hyperglycemia Lab Results  Component Value Date   HGBA1C 5.8 04/09/2016  only up from 5.6   Needs a refill of xanax  Last given 30 in October  Not taking it often/ very seldom Is his severe anxiety/ emergency medicine   Patient Active Problem List   Diagnosis Date Noted  . Grief 05/01/2016  . Compulsive eating patterns 05/01/2016  . Hyperglycemia 04/01/2016  . Left ankle pain 10/14/2015  . Caregiver stress 10/14/2015  . PVC's (premature ventricular contractions) 10/22/2013  . Colon cancer screening 05/23/2012  . Routine general medical examination at a health care facility 05/20/2012  . Knee pain, right  06/08/2011  . G E R D 08/04/2007  . Hyperlipidemia 07/22/2007  . Morbid obesity ([place]) 07/22/2007  . SMOKELESS TOBACCO ABUSE 07/22/2007  . Essential hypertension 07/22/2007  . SLEEP APNEA 07/22/2007   Past Medical History  Diagnosis Date  . HLD (hyperlipidemia)   . HTN (hypertension)   . Morbid obesity ([place])   . Fatty liver   . Esophageal reflux   . Unspecified sleep apnea   . Tobacco use disorder   . Allergy   . Arthritis     right knee   Past Surgical History  Procedure Laterality Date  . Knee arthroscopy      left   Social History  Substance Use Topics  . Smoking status: Former Smoker    Quit date: 12/31/1982  . Smokeless tobacco: Current User    Types: Snuff  . Alcohol Use: 0.0 oz/week    0 Standard drinks or equivalent per week     Comment: Rare   Family History  Problem Relation Age of Onset  . Heart attack Father 21    deceased  . Heart disease Father   .  Hypertension Mother   . Heart attack Mother     deceased  . Heart disease Mother   . Colon cancer Neg Hx   . Stomach cancer Neg Hx    No Known Allergies Current Outpatient Prescriptions on File Prior to Visit  Medication Sig Dispense Refill  . aspirin 81 MG tablet Take 81 mg by mouth daily.      . cetirizine (ZYRTEC) 10 MG tablet Take 10 mg by mouth daily.    . Cinnamon 500 MG capsule Take 500 mg by mouth daily.      . Coenzyme Q10 (CO Q 10) 100 MG CAPS Take 1 capsule by mouth daily.      . Cyanocobalamin (B-12 PO) Take 1 capsule by mouth daily.    . fish oil-omega-3 fatty acids 1000 MG capsule Take 2 g by mouth 2 (two) times daily.      Marland Kitchen glucosamine-chondroitin 500-400 MG tablet Take 1 tablet by mouth daily.      Marland Kitchen lisinopril-hydrochlorothiazide (PRINZIDE,ZESTORETIC) 20-25 MG tablet TAKE 1 TABLET BY MOUTH DAILY. 90 tablet 1  . NON FORMULARY CPAP at night as directed     . omeprazole (PRILOSEC) 40 MG capsule Take 1 capsule (40 mg total) by mouth daily. 90 capsule 3  . pyridoxine (B-6) 200 MG tablet Take 200 mg by mouth daily.      . Red Yeast Rice 600 MG CAPS Take 1 capsule by mouth daily.      . traMADol (ULTRAM) 50 MG tablet Take 1 tablet (50 mg total) by mouth every 6 (six) hours as needed. (Patient not taking: Reported on 05/01/2016) 50 tablet 2   No current facility-administered medications on file prior to visit.      Review of Systems Review of Systems  Constitutional: Negative for fever, appetite change, and unexpected weight change.  Eyes: Negative for pain and visual disturbance.  Respiratory: Negative for cough and shortness of breath.   Cardiovascular: Negative for cp or palpitations    Gastrointestinal: Negative for nausea, diarrhea and constipation.  Genitourinary: Negative for urgency and frequency.  Skin: Negative for pallor or rash   Neurological: Negative for weakness, light-headedness, numbness and headaches.  Hematological: Negative for adenopathy. Does not  bruise/bleed easily.  Psychiatric/Behavioral: Negative for dysphoric mood. Pos for grief rxn with occ anxiety       Objective:   Physical  Exam  Constitutional: He appears well-developed and well-nourished. No distress.  Morbidly obese and well appearing   HENT:  Head: Normocephalic and atraumatic.  Mouth/Throat: Oropharynx is clear and moist.  Eyes: Conjunctivae and EOM are normal. Pupils are equal, round, and reactive to light.  Neck: Normal range of motion. Neck supple. No JVD present. Carotid bruit is not present. No thyromegaly present.  Cardiovascular: Normal rate, regular rhythm, normal heart sounds and intact distal pulses.  Exam reveals no gallop.   Pulmonary/Chest: Effort normal and breath sounds normal. No respiratory distress. He has no wheezes. He has no rales.  No crackles  Abdominal: Soft. Bowel sounds are normal. He exhibits no distension, no abdominal bruit and no mass. There is no tenderness.  Musculoskeletal: He exhibits no edema.  Lymphadenopathy:    He has no cervical adenopathy.  Neurological: He is alert. He has normal reflexes.  Skin: Skin is warm and dry. No rash noted.  Psychiatric: He has a normal mood and affect. His speech is normal and behavior is normal. Thought content normal. His mood appears not anxious. His affect is not blunt and not labile. He does not exhibit a depressed mood.  Not tearful Good insight          Assessment & Plan:   Problem List Items Addressed This Visit      Cardiovascular and Mediastinum   Essential hypertension - Primary    Despite weight gain -this is still well controlled bp in fair control at this time  BP Readings from Last 1 Encounters:  05/01/16 126/78   No changes needed Disc lifstyle change with low sodium diet and exercise   Labs reviewed F/u 6 mo        Other   Compulsive eating patterns    Emotional eating pattern (with recent grief) Disc alt coping strategies Good support  Counseling offered    Voiced understanding-this is most important to control in order to have wt loss success       Grief    Reviewed stressors/ coping techniques/symptoms/ support sources/ tx options and side effects in detail today Has good support  Disc other alt to emotional eating for comfort  Offered counseling  Overall doing better and getting back to self care >25 minutes spent in face to face time with patient, >50% spent in counselling or coordination of care       Hyperglycemia    Fairly stable despite wt change Lab Results  Component Value Date   HGBA1C 5.8 04/09/2016   Disc low glycemic diet and exercise and wt loss to prevent DM      Hyperlipidemia    Disc goals for lipids and reasons to control them Rev labs with pt Rev low sat fat diet in detail Stable with red yeast rice/diet/coenzyme Q10 and fish oil  Continue to watch F/u 70mo      Morbid obesity ([place])    Discussed how this problem influences overall health and the risks it imposes  Reviewed plan for weight loss with lower calorie diet (via better food choices and also portion control or program like weight watchers) and exercise building up to or more than 30 minutes 5 days per week including some aerobic activity   Disc emotional eating issues and options for treating that  Exercise plan is in place

## 2016-05-01 NOTE — Assessment & Plan Note (Signed)
Emotional eating pattern (with recent grief) Disc alt coping strategies Good support  Counseling offered  Voiced understanding-this is most important to control in order to have wt loss success

## 2016-05-01 NOTE — Progress Notes (Signed)
Pre visit review using our clinic review tool, if applicable. No additional management support is needed unless otherwise documented below in the visit note. 

## 2016-05-01 NOTE — Assessment & Plan Note (Signed)
Reviewed stressors/ coping techniques/symptoms/ support sources/ tx options and side effects in detail today Has good support  Disc other alt to emotional eating for comfort  Offered counseling  Overall doing better and getting back to self care >25 minutes spent in face to face time with patient, >50% spent in counselling or coordination of care

## 2016-06-03 ENCOUNTER — Other Ambulatory Visit: Payer: Self-pay | Admitting: Family Medicine

## 2016-08-09 ENCOUNTER — Other Ambulatory Visit: Payer: Self-pay | Admitting: Family Medicine

## 2016-08-10 NOTE — Telephone Encounter (Signed)
Pt has f/u scheduled on 11/02/16, last filled on 05/01/16 #30 with 0 refills, please advise

## 2016-08-10 NOTE — Telephone Encounter (Signed)
Rx called in as prescribed 

## 2016-08-10 NOTE — Telephone Encounter (Signed)
Px written for call in   

## 2016-10-16 ENCOUNTER — Other Ambulatory Visit: Payer: Self-pay | Admitting: Family Medicine

## 2016-10-16 NOTE — Telephone Encounter (Signed)
Pt has f/u on 11/12/16, last filled on 08/10/16 #30 tabs with 0 refills, please advise

## 2016-10-16 NOTE — Telephone Encounter (Signed)
Px written for call in   

## 2016-10-16 NOTE — Telephone Encounter (Signed)
Rx called in as prescribed 

## 2016-10-26 ENCOUNTER — Other Ambulatory Visit (INDEPENDENT_AMBULATORY_CARE_PROVIDER_SITE_OTHER): Payer: 59

## 2016-10-26 DIAGNOSIS — R739 Hyperglycemia, unspecified: Secondary | ICD-10-CM

## 2016-10-26 DIAGNOSIS — E785 Hyperlipidemia, unspecified: Secondary | ICD-10-CM | POA: Diagnosis not present

## 2016-10-26 LAB — LIPID PANEL
CHOLESTEROL: 143 mg/dL (ref 0–200)
HDL: 35.5 mg/dL — AB (ref >39.00)
LDL Cholesterol: 93 mg/dL (ref 0–99)
NONHDL: 107.66
Total CHOL/HDL Ratio: 4
Triglycerides: 71 mg/dL (ref 0.0–149.0)
VLDL: 14.2 mg/dL (ref 0.0–40.0)

## 2016-10-26 LAB — COMPREHENSIVE METABOLIC PANEL
ALK PHOS: 41 U/L (ref 39–117)
ALT: 31 U/L (ref 0–53)
AST: 25 U/L (ref 0–37)
Albumin: 3.9 g/dL (ref 3.5–5.2)
BUN: 15 mg/dL (ref 6–23)
CHLORIDE: 105 meq/L (ref 96–112)
CO2: 30 mEq/L (ref 19–32)
Calcium: 9.5 mg/dL (ref 8.4–10.5)
Creatinine, Ser: 0.92 mg/dL (ref 0.40–1.50)
GFR: 91.06 mL/min (ref >60.00)
GLUCOSE: 111 mg/dL — AB (ref 70–99)
POTASSIUM: 4.4 meq/L (ref 3.5–5.1)
Sodium: 141 mEq/L (ref 135–145)
TOTAL PROTEIN: 7.1 g/dL (ref 6.0–8.3)
Total Bilirubin: 0.7 mg/dL (ref 0.2–1.2)

## 2016-10-26 LAB — HEMOGLOBIN A1C: HEMOGLOBIN A1C: 5.5 % (ref 4.6–6.5)

## 2016-11-02 ENCOUNTER — Ambulatory Visit: Payer: 59 | Admitting: Family Medicine

## 2016-11-12 ENCOUNTER — Other Ambulatory Visit: Payer: Self-pay | Admitting: Family Medicine

## 2016-11-12 ENCOUNTER — Encounter: Payer: Self-pay | Admitting: Family Medicine

## 2016-11-12 ENCOUNTER — Ambulatory Visit (INDEPENDENT_AMBULATORY_CARE_PROVIDER_SITE_OTHER): Payer: 59 | Admitting: Family Medicine

## 2016-11-12 VITALS — BP 132/78 | HR 63 | Temp 98.1°F | Ht 69.5 in | Wt 365.5 lb

## 2016-11-12 DIAGNOSIS — I10 Essential (primary) hypertension: Secondary | ICD-10-CM | POA: Diagnosis not present

## 2016-11-12 DIAGNOSIS — E7800 Pure hypercholesterolemia, unspecified: Secondary | ICD-10-CM | POA: Diagnosis not present

## 2016-11-12 DIAGNOSIS — F509 Eating disorder, unspecified: Secondary | ICD-10-CM

## 2016-11-12 DIAGNOSIS — R739 Hyperglycemia, unspecified: Secondary | ICD-10-CM

## 2016-11-12 DIAGNOSIS — Z23 Encounter for immunization: Secondary | ICD-10-CM

## 2016-11-12 MED ORDER — OMEPRAZOLE 40 MG PO CPDR: DELAYED_RELEASE_CAPSULE | ORAL | 3 refills | Status: DC

## 2016-11-12 MED ORDER — LISINOPRIL-HYDROCHLOROTHIAZIDE 20-25 MG PO TABS: 1.0000 | ORAL_TABLET | Freq: Every day | ORAL | 3 refills | Status: DC

## 2016-11-12 NOTE — Assessment & Plan Note (Signed)
Lab Results  Component Value Date   HGBA1C 5.5 10/26/2016   This is down  Urged to keep working on low glycemic diet and wt loss  F//u 6 mo

## 2016-11-12 NOTE — Progress Notes (Signed)
Subjective:    Patient ID: Charles Morales, male    DOB: Oct 13, 1961, 55 y.o.   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  Has had synvisc in the past-worked well Will need knee replacement in the past   Wt Readings from Last 3 Encounters:  11/12/16 (!) 365 lb 8 oz (165.8 kg)  05/01/16 (!) 358 lb 8 oz (162.6 kg)  10/14/15 (!) 353 lb 8 oz (160.3 kg)  up about 7 lb (he states he gained and lost more first) - was up to 373  Adjusted eating habits- has stopped fried foods / eating more vegetables  Starting to make better choices and getting away from the junk  bmi is 53.2 Morbidly obese  He has started exercising - 6 weeks ago  30-45 min on a treadmill and upper body work - four times per week   Emotional eating / from anxiety and grief is getting better Xanax helps for prn use -only when needed  (needs it occ at work)  Does not impair him  Exercise is helping him emotionally and less emotional eating  Headed the right direction  Still has grief    bp is stable today  No cp or palpitations or headaches or edema  No side effects to medicines  BP Readings from Last 3 Encounters:  11/12/16 132/78  05/01/16 126/78  10/14/15 124/72     Hx of hyperlipidemia Lab Results  Component Value Date   CHOL 143 10/26/2016   CHOL 136 04/09/2016   CHOL 134 04/26/2015   Lab Results  Component Value Date   HDL 35.50 (L) 10/26/2016   HDL 31.40 (L) 04/09/2016   HDL 32.60 (L) 04/26/2015   Lab Results  Component Value Date   LDLCALC 93 10/26/2016   LDLCALC 79 04/09/2016   LDLCALC 86 04/26/2015   Lab Results  Component Value Date   TRIG 71.0 10/26/2016   TRIG 126.0 04/09/2016   TRIG 78.0 04/26/2015   Lab Results  Component Value Date   CHOLHDL 4 10/26/2016   CHOLHDL 4 04/09/2016   CHOLHDL 4 04/26/2015   No results found for: LDLDIRECT  Hx of hyperglycemia Lab Results    Component Value Date   HGBA1C 5.5 10/26/2016   This is down  Eating less carbs -making the effort   Overall more motivated   Patient Active Problem List   Diagnosis Date Noted  . Grief 05/01/2016  . Compulsive eating patterns 05/01/2016  . Hyperglycemia 04/01/2016  . Left ankle pain 10/14/2015  . PVC's (premature ventricular contractions) 10/22/2013  . Colon cancer screening 05/23/2012  . Routine general medical examination at a health care facility 05/20/2012  . Knee pain, right 06/08/2011  . G E R D 08/04/2007  . Hyperlipidemia 07/22/2007  . Morbid obesity ([place]) 07/22/2007  . SMOKELESS TOBACCO ABUSE 07/22/2007  . Essential hypertension 07/22/2007  . SLEEP APNEA 07/22/2007   Past Medical History:  Diagnosis Date  . Allergy   . Arthritis    right knee  . Esophageal reflux   . Fatty liver   . HLD (hyperlipidemia)   . HTN (hypertension)   . Morbid obesity ([place])   . Tobacco use disorder   . Unspecified sleep apnea    Past Surgical History:  Procedure Laterality Date  . KNEE ARTHROSCOPY     left   Social History  Substance  Use Topics  . Smoking status: Former Smoker    Quit date: 12/31/1982  . Smokeless tobacco: Current User    Types: Snuff  . Alcohol use 0.0 oz/week     Comment: Rare   Family History  Problem Relation Age of Onset  . Heart attack Father 29    deceased  . Heart disease Father   . Hypertension Mother   . Heart attack Mother     deceased  . Heart disease Mother   . Colon cancer Neg Hx   . Stomach cancer Neg Hx    No Known Allergies Current Outpatient Prescriptions on File Prior to Visit  Medication Sig Dispense Refill  . ALPRAZolam (XANAX) 0.5 MG tablet TAKE 1 TABLET BY MOUTH EVERY DAY AS NEEDED FOR ANXIETY 30 tablet 0  . aspirin 81 MG tablet Take 81 mg by mouth daily.      . cetirizine (ZYRTEC) 10 MG tablet Take 10 mg by mouth daily.    . Cinnamon 500 MG capsule Take 500 mg by mouth daily.      . Coenzyme Q10 (CO Q 10) 100 MG CAPS  Take 1 capsule by mouth daily.      . Cyanocobalamin (B-12 PO) Take 1 capsule by mouth daily.    . fish oil-omega-3 fatty acids 1000 MG capsule Take 2 g by mouth 2 (two) times daily.      Marland Kitchen glucosamine-chondroitin 500-400 MG tablet Take 1 tablet by mouth daily.      . NON FORMULARY CPAP at night as directed     . pyridoxine (B-6) 200 MG tablet Take 200 mg by mouth daily.      . Red Yeast Rice 600 MG CAPS Take 1 capsule by mouth daily.       No current facility-administered medications on file prior to visit.     Review of Systems Review of Systems  Constitutional: Negative for fever, appetite change, fatigue and unexpected weight change.  Eyes: Negative for pain and visual disturbance.  Respiratory: Negative for cough and shortness of breath.   Cardiovascular: Negative for cp or palpitations    Gastrointestinal: Negative for nausea, diarrhea and constipation.  Genitourinary: Negative for urgency and frequency.  Skin: Negative for pallor or rash   MSK pos for knee pain  Neurological: Negative for weakness, light-headedness, numbness and headaches.  Hematological: Negative for adenopathy. Does not bruise/bleed easily.  Psychiatric/Behavioral: Negative for dysphoric mood. The patient is not nervous/anxious.         Objective:   Physical Exam  Constitutional: He appears well-developed and well-nourished. No distress.  Morbidly obese and well appearing   HENT:  Head: Normocephalic and atraumatic.  Mouth/Throat: Oropharynx is clear and moist.  Eyes: Conjunctivae and EOM are normal. Pupils are equal, round, and reactive to light.  Neck: Normal range of motion. Neck supple. No JVD present. Carotid bruit is not present. No thyromegaly present.  Cardiovascular: Normal rate, regular rhythm, normal heart sounds and intact distal pulses.  Exam reveals no gallop.   Pulmonary/Chest: Effort normal and breath sounds normal. No respiratory distress. He has no wheezes. He has no rales.  No crackles   Abdominal: Soft. Bowel sounds are normal. He exhibits no distension, no abdominal bruit and no mass. There is no tenderness.  Musculoskeletal: He exhibits no edema.  Limited rom knees  Lymphadenopathy:    He has no cervical adenopathy.  Neurological: He is alert. He has normal reflexes. He displays normal reflexes.  Skin: Skin is warm and  dry. No rash noted. No pallor.  Psychiatric: He has a normal mood and affect.  More relaxed affect today          Assessment & Plan:   Problem List Items Addressed This Visit      Cardiovascular and Mediastinum   Essential hypertension    bp in fair control at this time  BP Readings from Last 1 Encounters:  11/12/16 132/78   No changes needed Disc lifstyle change with low sodium diet and exercise  Enc to keep working on wt loss       Relevant Medications   lisinopril-hydrochlorothiazide (PRINZIDE,ZESTORETIC) 20-25 MG tablet     Other   Compulsive eating patterns    Improved - exercise seems to be helping grief and anxiety  Commended       Hyperglycemia    Lab Results  Component Value Date   HGBA1C 5.5 10/26/2016   This is down  Urged to keep working on low glycemic diet and wt loss  F//u 6 mo       Hyperlipidemia    Disc goals for lipids and reasons to control them Rev labs with pt Rev low sat fat diet in detail       Relevant Medications   lisinopril-hydrochlorothiazide (PRINZIDE,ZESTORETIC) 20-25 MG tablet   Morbid obesity ([place])    Discussed how this problem influences overall health and the risks it imposes  Reviewed plan for weight loss with lower calorie diet (via better food choices and also portion control or program like weight watchers) and exercise building up to or more than 30 minutes 5 days per week including some aerobic activity   He is working harder and has lost some wt at home-commended        Other Visit Diagnoses    Need for influenza vaccination    -  Primary   Relevant Orders   Flu Vaccine  QUAD 36+ mos IM (Completed)

## 2016-11-12 NOTE — Assessment & Plan Note (Signed)
bp in fair control at this time  BP Readings from Last 1 Encounters:  11/12/16 132/78   No changes needed Disc lifstyle change with low sodium diet and exercise  Enc to keep working on wt loss

## 2016-11-12 NOTE — Assessment & Plan Note (Signed)
Discussed how this problem influences overall health and the risks it imposes  Reviewed plan for weight loss with lower calorie diet (via better food choices and also portion control or program like weight watchers) and exercise building up to or more than 30 minutes 5 days per week including some aerobic activity   He is working harder and has lost some wt at home-commended

## 2016-11-12 NOTE — Progress Notes (Signed)
Pre visit review using our clinic review tool, if applicable. No additional management support is needed unless otherwise documented below in the visit note. 

## 2016-11-12 NOTE — Assessment & Plan Note (Signed)
Improved - exercise seems to be helping grief and anxiety  Commended

## 2016-11-12 NOTE — Assessment & Plan Note (Signed)
Disc goals for lipids and reasons to control them Rev labs with pt Rev low sat fat diet in detail   

## 2016-11-12 NOTE — Patient Instructions (Addendum)
Keep up the good work with diet and exercise and the rewards will keep coming (better mood/ better energy and strength/ less knee pain)  One day at a time  Cholesterol is stable  Blood glucose is improved  Flu shot today  Follow up in 6 months for your annual exam

## 2016-12-15 ENCOUNTER — Other Ambulatory Visit: Payer: Self-pay | Admitting: Family Medicine

## 2016-12-31 ENCOUNTER — Other Ambulatory Visit: Payer: Self-pay | Admitting: Family Medicine

## 2017-01-01 NOTE — Telephone Encounter (Signed)
Px written for call in   

## 2017-01-01 NOTE — Telephone Encounter (Signed)
Pt has CPE scheduled on 05/13/17, last filled on 10/16/16 #30 tabs with 0 refill, please advise

## 2017-01-01 NOTE — Telephone Encounter (Signed)
Rx called in as prescribed 

## 2017-02-25 ENCOUNTER — Other Ambulatory Visit: Payer: Self-pay | Admitting: Family Medicine

## 2017-02-26 IMAGING — CR DG ANKLE COMPLETE 3+V*L*
4 series · 4 of 4 positions shown · non-contrast
Comparison: None.

CLINICAL DATA: Pain in the left ankle 1 bearing weight, no injury.

EXAM:
LEFT ANKLE COMPLETE - 3+ VIEW

[view not recorded (1 of 4)]
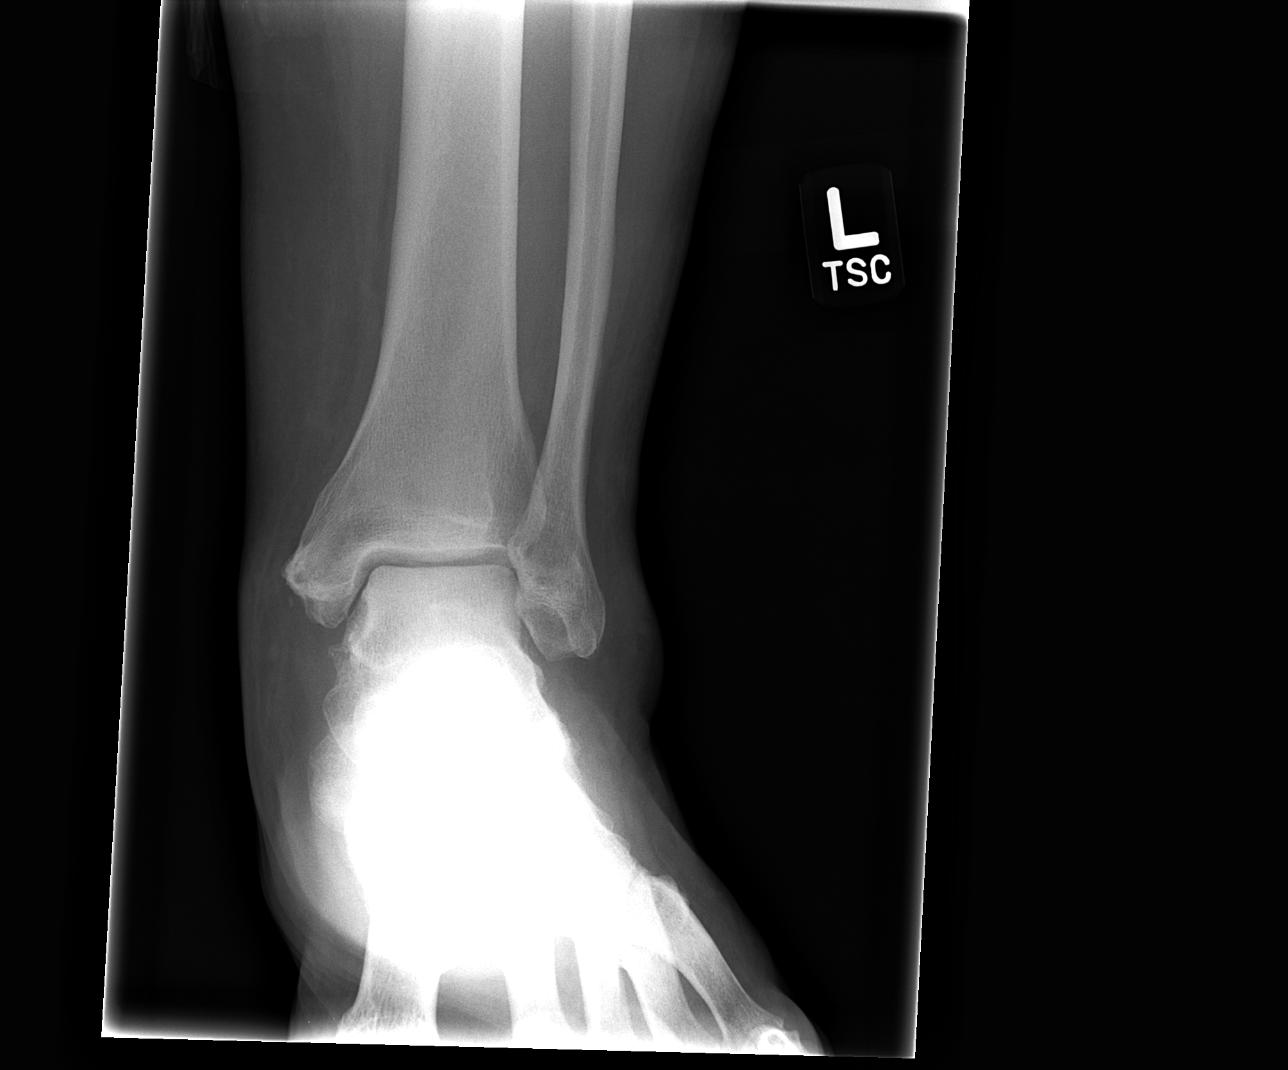

[view not recorded (2 of 4)]
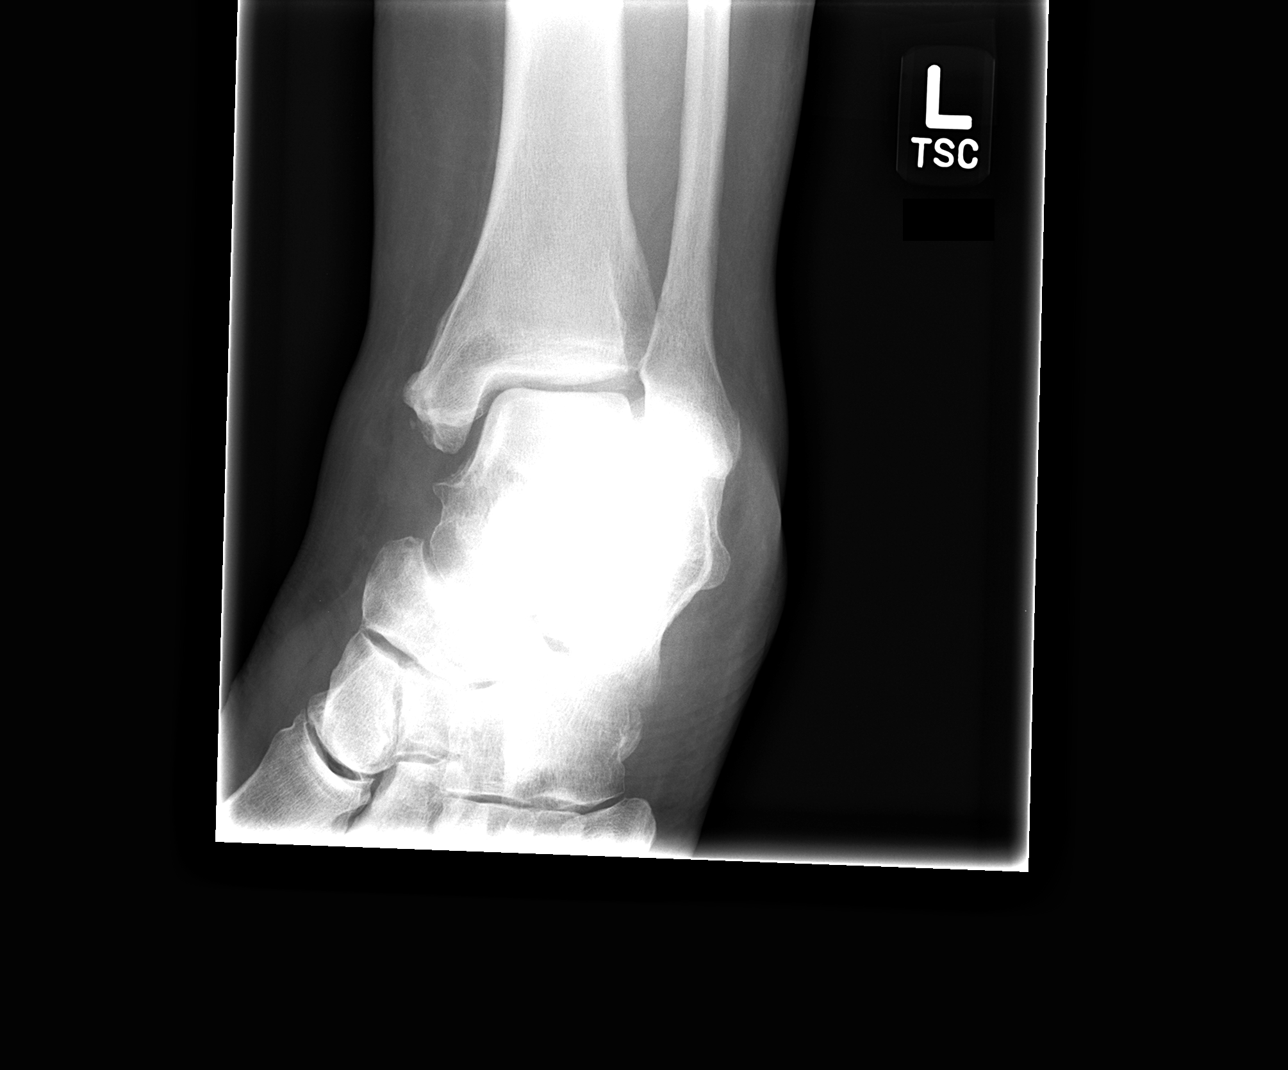

[view not recorded (3 of 4)]
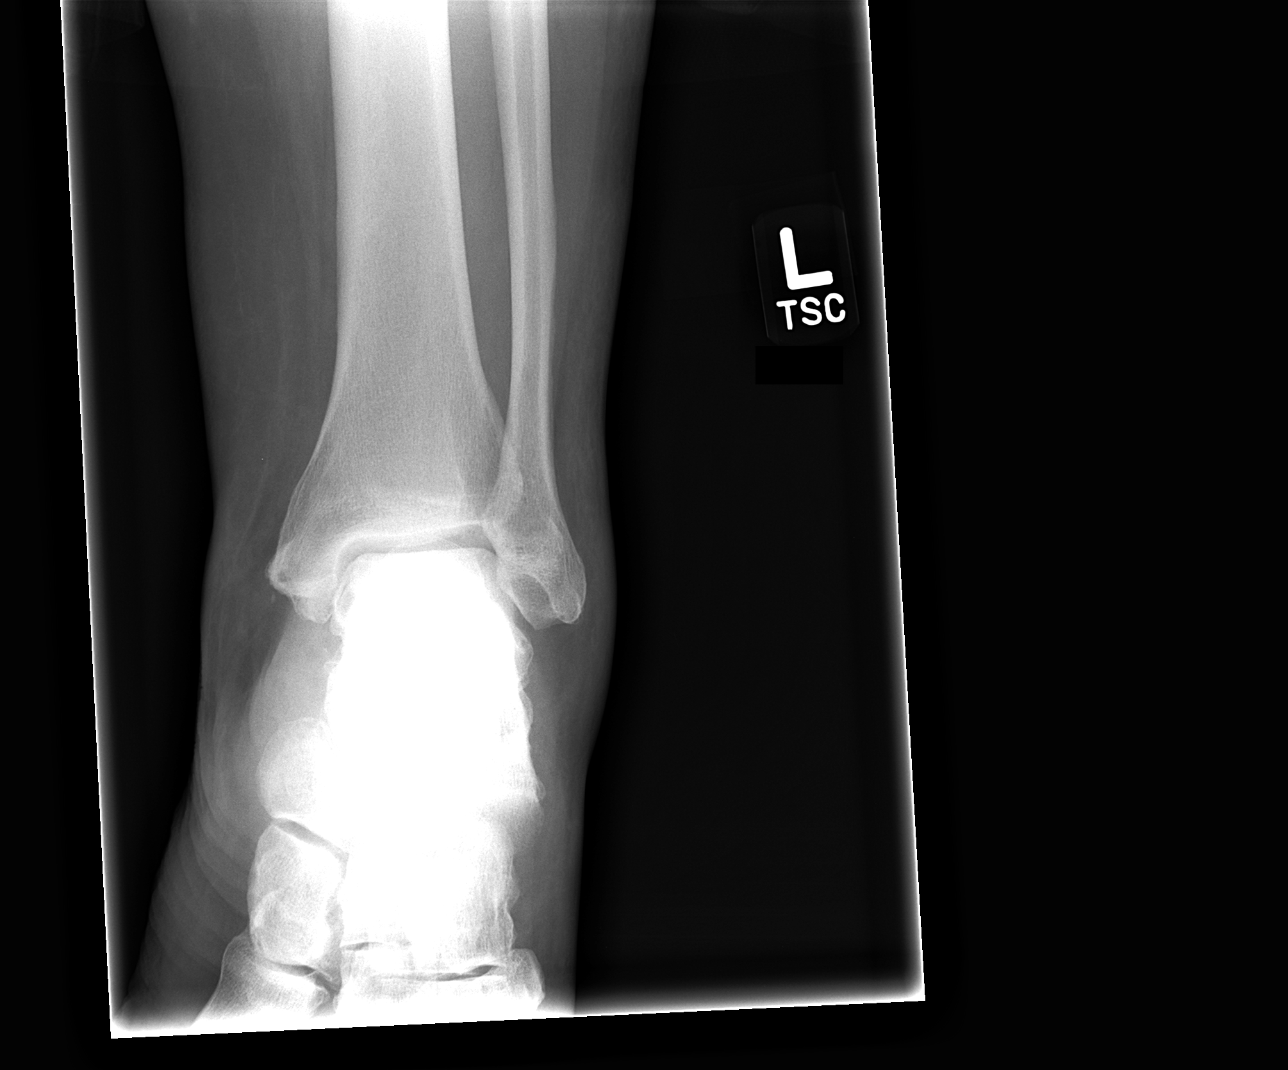

[view not recorded (4 of 4)]
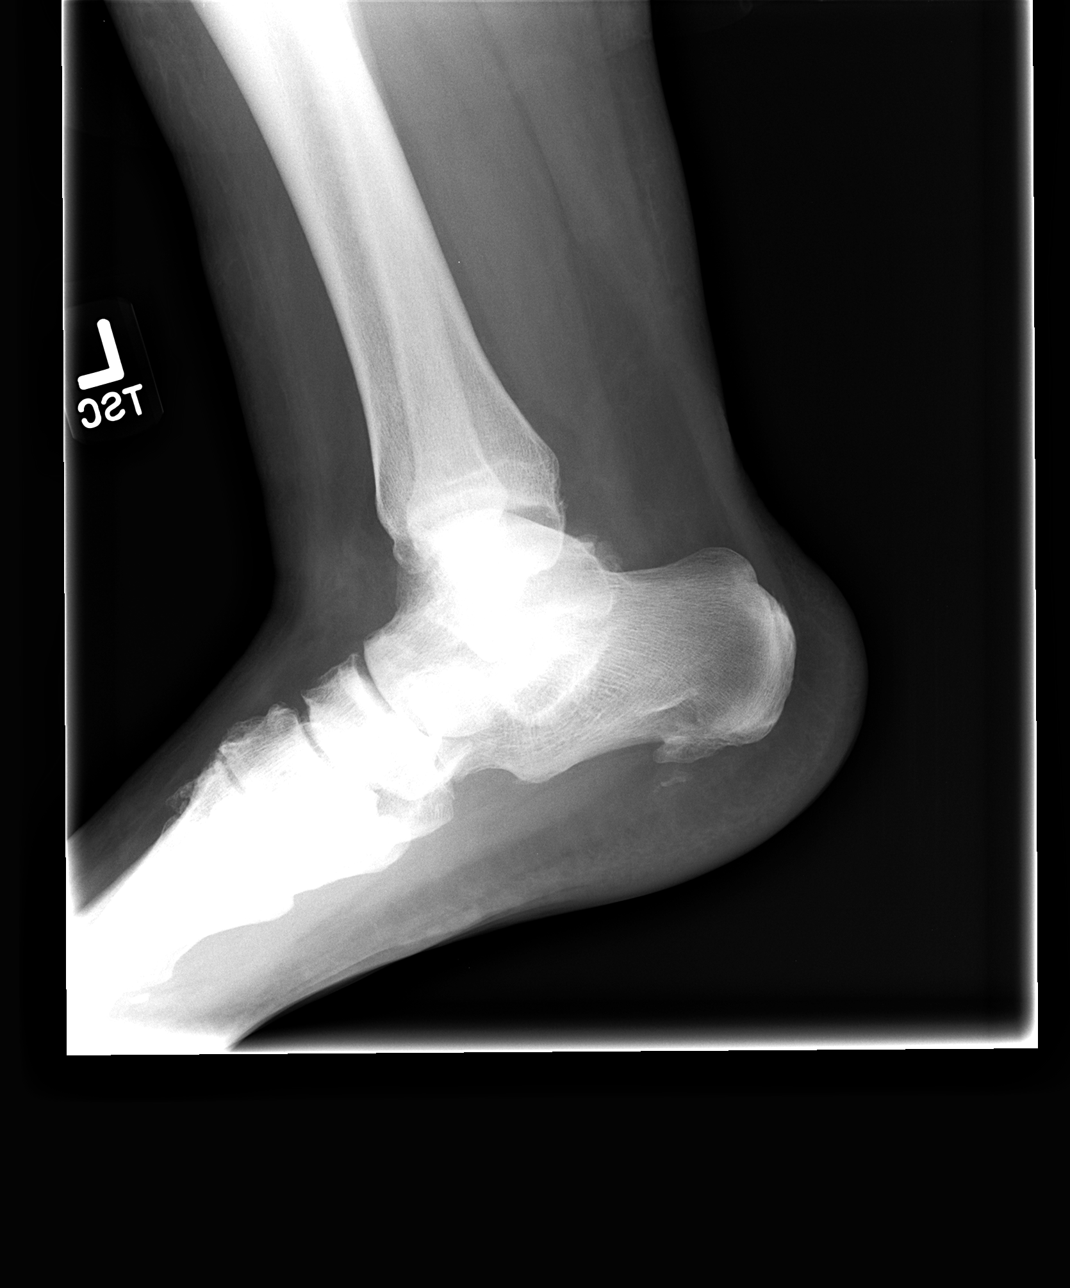

[4 of 4 positions shown; findings below may reference images not displayed]

FINDINGS: Well corticated tiny osseous fragment adjacent to the medial
malleolus appears remote in age. Ankle mortise is intact. No acute
or healing fracture. Degenerative changes are seen in the midfoot.
Calcaneal spur.
IMPRESSION: 1. No acute findings.
2. Midfoot degenerative change, incidentally imaged.

## 2017-02-26 NOTE — Telephone Encounter (Signed)
Px written for call in   

## 2017-02-26 NOTE — Telephone Encounter (Signed)
CPE scheduled on 05/13/17 last filled on 01/01/17 #30 tabs with 0 refills, please advise

## 2017-02-26 NOTE — Telephone Encounter (Signed)
Rx called in as prescribed 

## 2017-04-28 ENCOUNTER — Telehealth: Payer: Self-pay | Admitting: Family Medicine

## 2017-04-28 DIAGNOSIS — R739 Hyperglycemia, unspecified: Secondary | ICD-10-CM

## 2017-04-28 DIAGNOSIS — Z0000 Encounter for general adult medical examination without abnormal findings: Secondary | ICD-10-CM

## 2017-04-28 NOTE — Telephone Encounter (Signed)
-----   Message from Ellamae Sia sent at 04/24/2017 11:32 AM EDT ----- Regarding: Lab orders for Monday, 5.7.18 Patient is scheduled for CPX labs, please order future labs, Thanks , Karna Christmas

## 2017-05-06 ENCOUNTER — Other Ambulatory Visit (INDEPENDENT_AMBULATORY_CARE_PROVIDER_SITE_OTHER): Payer: 59

## 2017-05-06 DIAGNOSIS — R739 Hyperglycemia, unspecified: Secondary | ICD-10-CM | POA: Diagnosis not present

## 2017-05-06 DIAGNOSIS — Z0000 Encounter for general adult medical examination without abnormal findings: Secondary | ICD-10-CM

## 2017-05-06 LAB — CBC WITH DIFFERENTIAL/PLATELET
BASOS ABS: 0 10*3/uL (ref 0.0–0.1)
Basophils Relative: 0.5 % (ref 0.0–3.0)
EOS ABS: 0.1 10*3/uL (ref 0.0–0.7)
Eosinophils Relative: 2.1 % (ref 0.0–5.0)
HEMATOCRIT: 43.1 % (ref 39.0–52.0)
Hemoglobin: 14.5 g/dL (ref 13.0–17.0)
LYMPHS ABS: 1.9 10*3/uL (ref 0.7–4.0)
LYMPHS PCT: 29.9 % (ref 12.0–46.0)
MCHC: 33.6 g/dL (ref 30.0–36.0)
MCV: 90 fl (ref 78.0–100.0)
MONO ABS: 0.6 10*3/uL (ref 0.1–1.0)
Monocytes Relative: 9 % (ref 3.0–12.0)
NEUTROS ABS: 3.7 10*3/uL (ref 1.4–7.7)
NEUTROS PCT: 58.5 % (ref 43.0–77.0)
Platelets: 186 10*3/uL (ref 150.0–400.0)
RBC: 4.79 Mil/uL (ref 4.22–5.81)
RDW: 15.3 % (ref 11.5–15.5)
WBC: 6.3 10*3/uL (ref 4.0–10.5)

## 2017-05-06 LAB — COMPREHENSIVE METABOLIC PANEL
ALT: 40 U/L (ref 0–53)
AST: 27 U/L (ref 0–37)
Albumin: 4 g/dL (ref 3.5–5.2)
Alkaline Phosphatase: 38 U/L — ABNORMAL LOW (ref 39–117)
BILIRUBIN TOTAL: 0.5 mg/dL (ref 0.2–1.2)
BUN: 15 mg/dL (ref 6–23)
CHLORIDE: 105 meq/L (ref 96–112)
CO2: 27 mEq/L (ref 19–32)
CREATININE: 0.86 mg/dL (ref 0.40–1.50)
Calcium: 9.2 mg/dL (ref 8.4–10.5)
GFR: 98.24 mL/min (ref >60.00)
GLUCOSE: 107 mg/dL — AB (ref 70–99)
Potassium: 3.9 mEq/L (ref 3.5–5.1)
SODIUM: 139 meq/L (ref 135–145)
Total Protein: 6.9 g/dL (ref 6.0–8.3)

## 2017-05-06 LAB — HEMOGLOBIN A1C: Hgb A1c MFr Bld: 5.7 % (ref 4.6–6.5)

## 2017-05-06 LAB — LIPID PANEL
CHOL/HDL RATIO: 4
Cholesterol: 136 mg/dL (ref 0–200)
HDL: 35.6 mg/dL — ABNORMAL LOW (ref >39.00)
LDL CALC: 88 mg/dL (ref 0–99)
NonHDL: 100.02
Triglycerides: 58 mg/dL (ref 0.0–149.0)
VLDL: 11.6 mg/dL (ref 0.0–40.0)

## 2017-05-06 LAB — TSH: TSH: 0.51 u[IU]/mL (ref 0.35–4.50)

## 2017-05-13 ENCOUNTER — Encounter: Payer: 59 | Admitting: Family Medicine

## 2017-05-20 ENCOUNTER — Encounter: Payer: Self-pay | Admitting: Family Medicine

## 2017-05-20 ENCOUNTER — Ambulatory Visit (INDEPENDENT_AMBULATORY_CARE_PROVIDER_SITE_OTHER): Payer: 59 | Admitting: Family Medicine

## 2017-05-20 VITALS — BP 126/68 | HR 67 | Temp 98.6°F | Ht 69.5 in | Wt 356.0 lb

## 2017-05-20 DIAGNOSIS — I10 Essential (primary) hypertension: Secondary | ICD-10-CM | POA: Diagnosis not present

## 2017-05-20 DIAGNOSIS — R739 Hyperglycemia, unspecified: Secondary | ICD-10-CM | POA: Diagnosis not present

## 2017-05-20 DIAGNOSIS — G4733 Obstructive sleep apnea (adult) (pediatric): Secondary | ICD-10-CM

## 2017-05-20 DIAGNOSIS — E7800 Pure hypercholesterolemia, unspecified: Secondary | ICD-10-CM

## 2017-05-20 DIAGNOSIS — Z0000 Encounter for general adult medical examination without abnormal findings: Secondary | ICD-10-CM

## 2017-05-20 MED ORDER — OMEPRAZOLE 40 MG PO CPDR: DELAYED_RELEASE_CAPSULE | ORAL | 3 refills | Status: DC

## 2017-05-20 MED ORDER — LISINOPRIL-HYDROCHLOROTHIAZIDE 20-25 MG PO TABS: 1.0000 | ORAL_TABLET | Freq: Every day | ORAL | 3 refills | Status: DC

## 2017-05-20 NOTE — Progress Notes (Signed)
Subjective:    Patient ID: Charles Morales, male    DOB: [birth year], 56 y.o.   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  Stress at times /hit or miss   Wt Readings from Last 3 Encounters:  05/20/17 (!) 356 lb (161.5 kg)  11/12/16 (!) 365 lb 8 oz (165.8 kg)  05/01/16 (!) 358 lb 8 oz (162.6 kg)   bmi 51.8 Exercise is at times limited due to knee problems (needs to f/u with ortho) Has had lubricant injections   Eating is fair - varies day to day  No sugar drinks   Other than fatigue- feeling ok  Pushing himself hard lately Vacation is coming up   Hep C/HIV screen - does not think he is high risk   Flu shot- last fall  Colonoscopy 9/13- sessile polyp - 5 year recall/ will be due in the fall   Tetanus shot 5/13  Shingles status - interested in Shingrix if covered   Prostate history - no prostate cancer in family  No nocturia as a rule  No urinary symptoms     bp is stable today  No cp or palpitations or headaches or edema  No side effects to medicines  BP Readings from Last 3 Encounters:  05/20/17 126/68  11/12/16 132/78  05/01/16 126/78      Hx of hyperglycemia Lab Results  Component Value Date   HGBA1C 5.7 05/06/2017   This is up slt from 5.5  Eats at the wrong times due to schedule   Hx of hyperlipidemia Lab Results  Component Value Date   CHOL 136 05/06/2017   CHOL 143 10/26/2016   CHOL 136 04/09/2016   Lab Results  Component Value Date   HDL 35.60 (L) 05/06/2017   HDL 35.50 (L) 10/26/2016   HDL 31.40 (L) 04/09/2016   Lab Results  Component Value Date   LDLCALC 88 05/06/2017   LDLCALC 93 10/26/2016   LDLCALC 79 04/09/2016   Lab Results  Component Value Date   TRIG 58.0 05/06/2017   TRIG 71.0 10/26/2016   TRIG 126.0 04/09/2016   Lab Results  Component Value Date   CHOLHDL 4 05/06/2017   CHOLHDL 4 10/26/2016   CHOLHDL 4  04/09/2016   No results found for: LDLDIRECT  He takes red yeast rice- no side effects  Watching diet for fats (trans/sat) - not perfect but makes the effort    Chemistry      Component Value Date/Time   NA 139 05/06/2017 0816   K 3.9 05/06/2017 0816   CL 105 05/06/2017 0816   CO2 27 05/06/2017 0816   BUN 15 05/06/2017 0816   CREATININE 0.86 05/06/2017 0816      Component Value Date/Time   CALCIUM 9.2 05/06/2017 0816   ALKPHOS 38 (L) 05/06/2017 0816   AST 27 05/06/2017 0816   ALT 40 05/06/2017 0816   BILITOT 0.5 05/06/2017 0816      Lab Results  Component Value Date   WBC 6.3 05/06/2017   HGB 14.5 05/06/2017   HCT 43.1 05/06/2017   MCV 90.0 05/06/2017   PLT 186.0 05/06/2017   Lab Results  Component Value Date   TSH 0.51 05/06/2017    Glucose 107    Patient Active Problem List   Diagnosis Date Noted  . Grief 05/01/2016  . Compulsive eating patterns  05/01/2016  . Hyperglycemia 04/01/2016  . Left ankle pain 10/14/2015  . PVC's (premature ventricular contractions) 10/22/2013  . Colon cancer screening 05/23/2012  . Routine general medical examination at a health care facility 05/20/2012  . Knee pain, right 06/08/2011  . G E R D 08/04/2007  . Hyperlipidemia 07/22/2007  . Morbid obesity ([place]) 07/22/2007  . SMOKELESS TOBACCO ABUSE 07/22/2007  . Essential hypertension 07/22/2007  . Sleep apnea 07/22/2007   Past Medical History:  Diagnosis Date  . Allergy   . Arthritis    right knee  . Esophageal reflux   . Fatty liver   . HLD (hyperlipidemia)   . HTN (hypertension)   . Morbid obesity ([place])   . Tobacco use disorder   . Unspecified sleep apnea    Past Surgical History:  Procedure Laterality Date  . KNEE ARTHROSCOPY     left   Social History  Substance Use Topics  . Smoking status: Former Smoker    Quit date: 12/31/1982  . Smokeless tobacco: Current User    Types: Snuff  . Alcohol use 0.0 oz/week     Comment: Rare   Family History  Problem  Relation Age of Onset  . Heart attack Father 9       deceased  . Heart disease Father   . Hypertension Mother   . Heart attack Mother        deceased  . Heart disease Mother   . Colon cancer Neg Hx   . Stomach cancer Neg Hx    No Known Allergies Current Outpatient Prescriptions on File Prior to Visit  Medication Sig Dispense Refill  . ALPRAZolam (XANAX) 0.5 MG tablet TAKE 1 TABLET BY MOUTH EVERY DAY AS NEEDED FOR ANXIETY 30 tablet 3  . aspirin 81 MG tablet Take 81 mg by mouth daily.      . cetirizine (ZYRTEC) 10 MG tablet Take 10 mg by mouth daily.    . Cinnamon 500 MG capsule Take 500 mg by mouth daily.      . Coenzyme Q10 (CO Q 10) 100 MG CAPS Take 1 capsule by mouth daily.      . Cyanocobalamin (B-12 PO) Take 1 capsule by mouth daily.    . fish oil-omega-3 fatty acids 1000 MG capsule Take 2 g by mouth 2 (two) times daily.      Marland Kitchen glucosamine-chondroitin 500-400 MG tablet Take 1 tablet by mouth daily.      . NON FORMULARY CPAP at night as directed     . pyridoxine (B-6) 200 MG tablet Take 200 mg by mouth daily.      . Red Yeast Rice 600 MG CAPS Take 1 capsule by mouth daily.       No current facility-administered medications on file prior to visit.      Review of Systems Review of Systems  Constitutional: Negative for fever, appetite change,  and unexpected weight change.  Eyes: Negative for pain and visual disturbance.  Respiratory: Negative for cough and shortness of breath.   Cardiovascular: Negative for cp or palpitations    Gastrointestinal: Negative for nausea, diarrhea and constipation.  Genitourinary: Negative for urgency and frequency.  Skin: Negative for pallor or rash   Neurological: Negative for weakness, light-headedness, numbness and headaches.  MSK pos for knee pain  Hematological: Negative for adenopathy. Does not bruise/bleed easily.  Psychiatric/Behavioral: Negative for dysphoric mood. The patient is not nervous/anxious.         Objective:   Physical  Exam  Constitutional: He appears well-developed and well-nourished. No distress.  Morbidly obese and well appearing   HENT:  Head: Normocephalic and atraumatic.  Right Ear: External ear normal.  Left Ear: External ear normal.  Nose: Nose normal.  Mouth/Throat: Oropharynx is clear and moist.  Eyes: Conjunctivae and EOM are normal. Pupils are equal, round, and reactive to light. Right eye exhibits no discharge. Left eye exhibits no discharge. No scleral icterus.  Neck: Normal range of motion. Neck supple. No JVD present. Carotid bruit is not present. No thyromegaly present.  Cardiovascular: Normal rate, regular rhythm, normal heart sounds and intact distal pulses.  Exam reveals no gallop.   Pulmonary/Chest: Effort normal and breath sounds normal. No respiratory distress. He has no wheezes. He exhibits no tenderness.  Abdominal: Soft. Bowel sounds are normal. He exhibits no distension, no abdominal bruit and no mass. There is no tenderness.  Musculoskeletal: He exhibits no edema or tenderness.  Lymphadenopathy:    He has no cervical adenopathy.  Neurological: He is alert. He has normal reflexes. No cranial nerve deficit. He exhibits normal muscle tone. Coordination normal.  Skin: Skin is warm and dry. No rash noted. No erythema. No pallor.  Scattered lentigines and brown nevi  Psychiatric: He has a normal mood and affect.  Pleasant and talkative           Assessment & Plan:   Problem List Items Addressed This Visit      Cardiovascular and Mediastinum   Essential hypertension - Primary    bp in fair control at this time  BP Readings from Last 1 Encounters:  05/20/17 126/68   No changes needed Disc lifstyle change with low sodium diet and exercise  Labs reviewed       Relevant Medications   lisinopril-hydrochlorothiazide (PRINZIDE,ZESTORETIC) 20-25 MG tablet     Respiratory   Sleep apnea    Pt wants to address old cpap equipment and also may need re titration  Will ref to  sleep clinic in pulmonary  Wt loss encouraged      Relevant Orders   Ambulatory referral to Pulmonology     Other   Hyperglycemia    Lab Results  Component Value Date   HGBA1C 5.7 05/06/2017   This is up slt  Enc wt loss disc imp of low glycemic diet and wt loss to prevent DM2       Hyperlipidemia    Disc goals for lipids and reasons to control them Rev labs with pt Rev low sat fat diet in detail LDL is controlled On red yeast rice Disc need for higher HDL      Relevant Medications   lisinopril-hydrochlorothiazide (PRINZIDE,ZESTORETIC) 20-25 MG tablet   Morbid obesity (HCC)    Discussed how this problem influences overall health and the risks it imposes  Reviewed plan for weight loss with lower calorie diet (via better food choices and also portion control or program like weight watchers) and exercise building up to or more than 30 minutes 5 days per week including some aerobic activity   He will be going to bariatric info session for wife soon- hopes to learn something Disc low impact exercise Low fat/glycemic diet       Routine general medical examination at a health care facility    Reviewed health habits including diet and exercise and skin cancer prevention Reviewed appropriate screening tests for age  Also reviewed health mt list, fam hx and immunization status , as well as social and family history  See HPI Labs reviewed Will need colonoscopy in the fall- he will schedule  Disc plan for wt loss Ref to pulm to disc cpap (refresher)  Disc Shingrix-he will check on coverage No prostate hx /symptoms or fam hx  Will start psa in a year

## 2017-05-20 NOTE — Assessment & Plan Note (Signed)
Reviewed health habits including diet and exercise and skin cancer prevention Reviewed appropriate screening tests for age  Also reviewed health mt list, fam hx and immunization status , as well as social and family history   See HPI Labs reviewed Will need colonoscopy in the fall- he will schedule  Disc plan for wt loss Ref to pulm to disc cpap (refresher)  Disc Shingrix-he will check on coverage No prostate hx /symptoms or fam hx  Will start psa in a year

## 2017-05-20 NOTE — Assessment & Plan Note (Addendum)
Pt wants to address old cpap equipment and also may need re titration  Will ref to sleep clinic in pulmonary  Wt loss encouraged

## 2017-05-20 NOTE — Assessment & Plan Note (Signed)
bp in fair control at this time  BP Readings from Last 1 Encounters:  05/20/17 126/68   No changes needed Disc lifstyle change with low sodium diet and exercise  Labs reviewed

## 2017-05-20 NOTE — Assessment & Plan Note (Signed)
Disc goals for lipids and reasons to control them Rev labs with pt Rev low sat fat diet in detail LDL is controlled On red yeast rice Disc need for higher HDL

## 2017-05-20 NOTE — Assessment & Plan Note (Signed)
Lab Results  Component Value Date   HGBA1C 5.7 05/06/2017   This is up slt  Enc wt loss disc imp of low glycemic diet and wt loss to prevent DM2

## 2017-05-20 NOTE — Patient Instructions (Addendum)
You are due for screening colonoscopy in the fall (sept)  If you need a referral for that - let us know at that time   If you are interested in the new shingles vaccine (Shingrix) - call your insurance to check on coverage,( you should not get it within 1 month of other vaccines) , then call us for a prescription  for it to take to a pharmacy that gives the shot , or make a nurse visit to get it here depending on your coverage   Look into low impact exercise (water/bike or other)  For prediabetes-see the handout  Try to get your carbs from produce and not bread/sweets/sweet drinks/snack foods/ pasta /rice etc   Aim to exercise - 30 minutes or more 5 days per week

## 2017-05-20 NOTE — Assessment & Plan Note (Signed)
Discussed how this problem influences overall health and the risks it imposes  Reviewed plan for weight loss with lower calorie diet (via better food choices and also portion control or program like weight watchers) and exercise building up to or more than 30 minutes 5 days per week including some aerobic activity   He will be going to bariatric info session for wife soon- hopes to learn something Disc low impact exercise Low fat/glycemic diet

## 2017-06-11 ENCOUNTER — Encounter: Payer: Self-pay | Admitting: *Deleted

## 2017-07-30 ENCOUNTER — Ambulatory Visit (INDEPENDENT_AMBULATORY_CARE_PROVIDER_SITE_OTHER): Payer: 59 | Admitting: Internal Medicine

## 2017-07-30 ENCOUNTER — Encounter: Payer: Self-pay | Admitting: Internal Medicine

## 2017-07-30 VITALS — BP 108/80 | HR 66 | Ht 69.5 in | Wt 360.0 lb

## 2017-07-30 DIAGNOSIS — G4733 Obstructive sleep apnea (adult) (pediatric): Secondary | ICD-10-CM

## 2017-07-30 NOTE — Progress Notes (Signed)
Name: Charles Morales MRN: [id number] DOB: 19-Jul-1961     CONSULTATION DATE: 07/30/17 REFERRING MD : Glori Bickers   CHIEF COMPLAINT: I have sleep apnea  STUDIES:  Chest x-ray 07/16/2007 images reviewed 07/30/2017 Interpretation no acute findings no focal opacities no pneumonia   HISTORY OF PRESENT ILLNESS: 56 year old pleasant white male seen today for a diagnosis of obstructive sleep apnea Patient was diagnosed with sleep apnea at [facility] in 2002 Patient has been using and benefiting from CPAP therapy at 10 cm of water pressure Patient uses a nasal mask but sometimes he says his mouth is open during his usage If patient is not using his CPAP he feels very fatigued has extreme excessive daytime sleepiness and very lethargic Patient weighs 360 pounds 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, 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 Patient does not smoke or does not drink alcohol  Patient does have reflux but is controlled with his medicines he also has allergic rhinitis but is controlled with antihistamines  Patient is not showing signs of congestive heart failure at this time Signs of infection at this time  Patient denies any chest pain or palpitations No shortness of breath no dyspnea on exertion   PAST MEDICAL HISTORY :   has a past medical history of Allergy; Arthritis; Esophageal reflux; Fatty liver; HLD (hyperlipidemia); HTN (hypertension); Morbid obesity ([place]); Tobacco use disorder; and Unspecified sleep apnea.  has a past surgical history that includes Knee arthroscopy. Prior to Admission medications   Medication Sig Start Date End Date Taking? Authorizing Provider  ALPRAZolam Duanne Moron) 0.5 MG tablet TAKE 1 TABLET BY MOUTH EVERY DAY AS NEEDED FOR ANXIETY 02/26/17  Yes Tower, Wynelle Fanny, MD  aspirin 81 MG tablet Take 81 mg by mouth daily.     Yes [provider]  cetirizine (ZYRTEC) 10 MG tablet Take 10 mg by mouth daily.   Yes [provider]  Cinnamon 500 MG capsule Take  500 mg by mouth daily.     Yes [provider]  Coenzyme Q10 (CO Q 10) 100 MG CAPS Take 1 capsule by mouth daily.     Yes [provider]  Cyanocobalamin (B-12 PO) Take 1 capsule by mouth daily.   Yes [provider]  fish oil-omega-3 fatty acids 1000 MG capsule Take 2 g by mouth 2 (two) times daily.     Yes [provider]  glucosamine-chondroitin 500-400 MG tablet Take 1 tablet by mouth daily.     Yes [provider]  lisinopril-hydrochlorothiazide (PRINZIDE,ZESTORETIC) 20-25 MG tablet Take 1 tablet by mouth daily. 05/20/17  Yes Tower, Wynelle Fanny, MD  NON FORMULARY CPAP at night as directed    Yes [provider]  omeprazole (PRILOSEC) 40 MG capsule TAKE 1 CAPSULE (40 MG TOTAL) BY MOUTH DAILY. 05/20/17  Yes Tower, Wynelle Fanny, MD  pyridoxine (B-6) 200 MG tablet Take 200 mg by mouth daily.     Yes [provider]  Red Yeast Rice 600 MG CAPS Take 1 capsule by mouth daily.     Yes [provider]  Turmeric 500 MG CAPS Take 2 capsules by mouth daily.   Yes [provider]   No Known Allergies  FAMILY HISTORY:  family history includes Heart attack in his mother; Heart attack (age of onset: 48) in his father; Heart disease in his father and mother; Hypertension in his mother. SOCIAL HISTORY:  reports that he quit smoking about 34 years ago. His smokeless tobacco use includes Snuff.  He reports that he drinks alcohol. He reports that he does not use drugs.  REVIEW OF SYSTEMS:   Constitutional: Negative for fever, chills, weight loss, malaise/fatigue and diaphoresis.  HENT: Negative for hearing loss, ear pain, nosebleeds, congestion, sore throat, neck pain, tinnitus and ear discharge.   Eyes: Negative for blurred vision, double vision, photophobia, pain, discharge and redness.  Respiratory: Negative for cough, hemoptysis, sputum production, shortness of breath, wheezing and stridor.   Cardiovascular: Negative for chest pain,  palpitations, orthopnea, claudication, leg swelling and PND.  Gastrointestinal: Negative for heartburn, nausea, vomiting, abdominal pain, diarrhea, constipation, blood in stool and melena.  Genitourinary: Negative for dysuria, urgency, frequency, hematuria and flank pain.  Musculoskeletal: Negative for myalgias, back pain, joint pain and falls.  Skin: Negative for itching and rash.  Neurological: Negative for dizziness, tingling, tremors, sensory change, speech change, focal weakness, seizures, loss of consciousness, weakness and headaches.  Endo/Heme/Allergies: Negative for environmental allergies and polydipsia. Does not bruise/bleed easily.  ALL OTHER ROS ARE NEGATIVE  BP 108/80 (BP Location: Left Arm, Cuff Size: Normal)   Pulse 66   Ht 5' 9.5" (1.765 m)   Wt (!) 360 lb (163.3 kg)   SpO2 93%   BMI 52.40 kg/m     Physical Examination:   GENERAL:NAD, no fevers, chills, no weakness no fatigue HEAD: Normocephalic, atraumatic.  EYES: Pupils equal, round, reactive to light. Extraocular muscles intact. No scleral icterus.  MOUTH: Moist mucosal membrane.   EAR, NOSE, THROAT: Clear without exudates. No external lesions.  NECK: Supple. No thyromegaly. No nodules. No JVD.  PULMONARY:CTA B/L no wheezes, no crackles, no rhonchi CARDIOVASCULAR: S1 and S2. Regular rate and rhythm. No murmurs, rubs, or gallops. No edema.  GASTROINTESTINAL: Soft, nontender, nondistended. No masses. Positive bowel sounds.  MUSCULOSKELETAL: No swelling, clubbing, or edema. Range of motion full in all extremities.  NEUROLOGIC: Cranial nerves II through XII are intact. No gross focal neurological deficits.  SKIN: No ulceration, lesions, rashes, or cyanosis. Skin warm and dry. Turgor intact.  PSYCHIATRIC: Mood, affect within normal limits. The patient is awake, alert and oriented x 3. Insight, judgment intact.      ASSESSMENT / PLAN: 56 year old white male seen today having been diagnosed with sleep apnea in 2002  with nasal mask and using and benefiting from CPAP at 10 cm of water pressure but he has not been retested in the last 10 years Patient states he has a new machine at this point but would like to get further assessment of his CPAP therapy  At this time patient is morbidly obese and in the deconditioned state and I have explained to him the benefits of losing weight and exercising  #1 diagnosis of upstroke of sleep apnea Patient will need CPAP titration study  #2 Obesity -recommend significant weight loss -recommend changing diet  #3 Deconditioned state -Recommend increased daily activity and exercise   Follow-up after tests completed and compliance report finalized  Patient/Family are satisfied with Plan of action and management. All questions answered  Corrin Parker, M.D.  [facility]  Medical Director [place] Director [facility] Cardio-Pulmonary Department

## 2017-07-30 NOTE — Patient Instructions (Addendum)
Will CPAP titration study

## 2017-08-27 ENCOUNTER — Ambulatory Visit: Payer: 59 | Attending: Internal Medicine

## 2017-08-27 ENCOUNTER — Encounter: Payer: Self-pay | Admitting: Internal Medicine

## 2017-08-27 DIAGNOSIS — G4761 Periodic limb movement disorder: Secondary | ICD-10-CM | POA: Diagnosis not present

## 2017-08-27 DIAGNOSIS — G4733 Obstructive sleep apnea (adult) (pediatric): Secondary | ICD-10-CM | POA: Insufficient documentation

## 2017-08-30 ENCOUNTER — Telehealth: Payer: Self-pay | Admitting: *Deleted

## 2017-08-30 DIAGNOSIS — G4733 Obstructive sleep apnea (adult) (pediatric): Secondary | ICD-10-CM

## 2017-08-30 NOTE — Telephone Encounter (Signed)
-----   Message from Laverle Hobby, MD sent at 08/29/2017  4:46 PM EDT ----- Regarding: CPAP titration results.  Would recommend Auto-CPAP with pressure range of 12-18 cmH2O. If pt's current CPAP machine is unable to be switched to Auto-PAP mode, then should be set at a pressure of 16.

## 2017-08-30 NOTE — Telephone Encounter (Signed)
Pt informed of results and needs new CPAP and full face mask. Order placed. Nothing further needed.

## 2017-08-30 NOTE — Telephone Encounter (Signed)
LMOM for pt to return call for results. 

## 2017-10-08 DIAGNOSIS — G4733 Obstructive sleep apnea (adult) (pediatric): Secondary | ICD-10-CM | POA: Diagnosis not present

## 2017-10-16 ENCOUNTER — Encounter: Payer: Self-pay | Admitting: Internal Medicine

## 2017-11-03 DIAGNOSIS — G4733 Obstructive sleep apnea (adult) (pediatric): Secondary | ICD-10-CM | POA: Diagnosis not present

## 2017-12-03 DIAGNOSIS — G4733 Obstructive sleep apnea (adult) (pediatric): Secondary | ICD-10-CM | POA: Diagnosis not present

## 2018-01-03 DIAGNOSIS — G4733 Obstructive sleep apnea (adult) (pediatric): Secondary | ICD-10-CM | POA: Diagnosis not present

## 2018-01-21 DIAGNOSIS — M1711 Unilateral primary osteoarthritis, right knee: Secondary | ICD-10-CM | POA: Diagnosis not present

## 2018-01-22 ENCOUNTER — Encounter: Payer: Self-pay | Admitting: Internal Medicine

## 2018-01-30 DIAGNOSIS — M1711 Unilateral primary osteoarthritis, right knee: Secondary | ICD-10-CM | POA: Diagnosis not present

## 2018-02-03 DIAGNOSIS — G4733 Obstructive sleep apnea (adult) (pediatric): Secondary | ICD-10-CM | POA: Diagnosis not present

## 2018-02-06 DIAGNOSIS — M1711 Unilateral primary osteoarthritis, right knee: Secondary | ICD-10-CM | POA: Diagnosis not present

## 2018-02-12 DIAGNOSIS — M1711 Unilateral primary osteoarthritis, right knee: Secondary | ICD-10-CM | POA: Diagnosis not present

## 2018-02-20 ENCOUNTER — Telehealth: Payer: Self-pay

## 2018-02-20 NOTE — Telephone Encounter (Signed)
Dr. Hilarie Fredrickson,  Charles Morales is scheduled for a colonoscopy on 03/19/18 in the [facility]. His BMI is 52.4 and his weight is 360 lbs. Do you want the patient to have an office visit or can he be a direct hospital colonoscopy? In 2013 he had his colonoscopy here in the [facility]. Please advise.Thanks!   Riki Sheer, LPN ( PV )

## 2018-02-21 NOTE — Telephone Encounter (Signed)
Ok for direct to hospital for surveillance colon

## 2018-02-24 ENCOUNTER — Other Ambulatory Visit: Payer: Self-pay | Admitting: Internal Medicine

## 2018-02-24 DIAGNOSIS — Z860100 Personal history of colon polyps, unspecified: Secondary | ICD-10-CM

## 2018-02-24 NOTE — Telephone Encounter (Signed)
Charles Morales,  I have gone ahead and scheduled patient for hospital colonoscopy on 04/29/18 at 56 am (Dr Vena Rua first available hospital opening). However, since he is coming for previsit 03/05/18 I have NOT gone over this information with him. Also, it looks like his last weight was almost 8 months ago so it is possible he has lost 10+ pounds in that time frame and would now qualify for L[facility]. May want to see ?! Thanks.

## 2018-03-03 DIAGNOSIS — G4733 Obstructive sleep apnea (adult) (pediatric): Secondary | ICD-10-CM | POA: Diagnosis not present

## 2018-03-05 ENCOUNTER — Other Ambulatory Visit: Payer: Self-pay

## 2018-03-05 ENCOUNTER — Ambulatory Visit (AMBULATORY_SURGERY_CENTER): Payer: Self-pay | Admitting: *Deleted

## 2018-03-05 VITALS — Ht 69.5 in | Wt 360.0 lb

## 2018-03-05 DIAGNOSIS — Z86010 Personal history of colonic polyps: Secondary | ICD-10-CM

## 2018-03-05 MED ORDER — NA SULFATE-K SULFATE-MG SULF 17.5-3.13-1.6 GM/177ML PO SOLN: 1.0000 | Freq: Once | ORAL | 0 refills | Status: AC

## 2018-03-05 NOTE — Progress Notes (Signed)
Patient denies any allergies to eggs or soy. Patient denies any problems with anesthesia/sedation. Patient denies any oxygen use at home. Patient denies taking any diet/weight loss medications or blood thinners. EMMI education assisgned to patient on colonoscopy, this was explained and instructions given to patient. During PV patient was notified of his procedure needing to be done at the [facility] due to weight over 350lbs and BMI over 50. Patient is unable to come the date that was given to him on 04/29/18 due to his work schedule. Pt request different date. Spoke with Carla Drape new date of May 21 st at 10:30 am, pt was able to come that day. PV completed.

## 2018-03-19 ENCOUNTER — Encounter: Payer: 59 | Admitting: Internal Medicine

## 2018-03-27 DIAGNOSIS — G4733 Obstructive sleep apnea (adult) (pediatric): Secondary | ICD-10-CM | POA: Diagnosis not present

## 2018-04-03 DIAGNOSIS — G4733 Obstructive sleep apnea (adult) (pediatric): Secondary | ICD-10-CM | POA: Diagnosis not present

## 2018-05-03 DIAGNOSIS — G4733 Obstructive sleep apnea (adult) (pediatric): Secondary | ICD-10-CM | POA: Diagnosis not present

## 2018-05-14 ENCOUNTER — Encounter (HOSPITAL_COMMUNITY): Payer: Self-pay | Admitting: *Deleted

## 2018-05-14 ENCOUNTER — Other Ambulatory Visit: Payer: Self-pay

## 2018-05-20 ENCOUNTER — Ambulatory Visit (HOSPITAL_COMMUNITY): Payer: 59 | Admitting: Anesthesiology

## 2018-05-20 ENCOUNTER — Encounter (HOSPITAL_COMMUNITY)
Admission: RE | Disposition: A | Discharge status: Home / Self Care | Payer: Self-pay | Source: Ambulatory Visit | Attending: Internal Medicine

## 2018-05-20 ENCOUNTER — Encounter (HOSPITAL_COMMUNITY): Payer: Self-pay

## 2018-05-20 ENCOUNTER — Ambulatory Visit (HOSPITAL_COMMUNITY)
Admission: RE | Admit: 2018-05-20 | Discharge: 2018-05-20 | Disposition: A | Discharge status: Home / Self Care | Payer: 59 | Source: Ambulatory Visit | Attending: Internal Medicine | Admitting: Internal Medicine

## 2018-05-20 DIAGNOSIS — K219 Gastro-esophageal reflux disease without esophagitis: Secondary | ICD-10-CM | POA: Diagnosis not present

## 2018-05-20 DIAGNOSIS — Z1211 Encounter for screening for malignant neoplasm of colon: Secondary | ICD-10-CM | POA: Diagnosis not present

## 2018-05-20 DIAGNOSIS — E785 Hyperlipidemia, unspecified: Secondary | ICD-10-CM | POA: Insufficient documentation

## 2018-05-20 DIAGNOSIS — Z8249 Family history of ischemic heart disease and other diseases of the circulatory system: Secondary | ICD-10-CM | POA: Diagnosis not present

## 2018-05-20 DIAGNOSIS — K5730 Diverticulosis of large intestine without perforation or abscess without bleeding: Secondary | ICD-10-CM | POA: Insufficient documentation

## 2018-05-20 DIAGNOSIS — Z79899 Other long term (current) drug therapy: Secondary | ICD-10-CM | POA: Insufficient documentation

## 2018-05-20 DIAGNOSIS — Z860100 Personal history of colon polyps, unspecified: Secondary | ICD-10-CM

## 2018-05-20 DIAGNOSIS — Z7982 Long term (current) use of aspirin: Secondary | ICD-10-CM | POA: Diagnosis not present

## 2018-05-20 DIAGNOSIS — K648 Other hemorrhoids: Secondary | ICD-10-CM | POA: Insufficient documentation

## 2018-05-20 DIAGNOSIS — G4730 Sleep apnea, unspecified: Secondary | ICD-10-CM | POA: Diagnosis not present

## 2018-05-20 DIAGNOSIS — Z6841 Body Mass Index (BMI) 40.0 and over, adult: Secondary | ICD-10-CM | POA: Insufficient documentation

## 2018-05-20 DIAGNOSIS — F17220 Nicotine dependence, chewing tobacco, uncomplicated: Secondary | ICD-10-CM | POA: Insufficient documentation

## 2018-05-20 DIAGNOSIS — I10 Essential (primary) hypertension: Secondary | ICD-10-CM | POA: Diagnosis not present

## 2018-05-20 HISTORY — PX: COLONOSCOPY WITH PROPOFOL: SHX5780

## 2018-05-20 SURGERY — COLONOSCOPY WITH PROPOFOL
Anesthesia: Monitor Anesthesia Care

## 2018-05-20 MED ORDER — PROPOFOL 10 MG/ML IV BOLUS
INTRAVENOUS | Status: DC | PRN
  Administered 2018-05-20: 20 mg via INTRAVENOUS

## 2018-05-20 MED ORDER — LACTATED RINGERS IV SOLN
INTRAVENOUS | Status: DC
  Administered 2018-05-20: 1000 mL via INTRAVENOUS

## 2018-05-20 MED ORDER — PROPOFOL 10 MG/ML IV BOLUS
INTRAVENOUS | Status: AC
  Filled 2018-05-20: qty 60

## 2018-05-20 MED ORDER — LIDOCAINE 2% (20 MG/ML) 5 ML SYRINGE
INTRAMUSCULAR | Status: DC | PRN
  Administered 2018-05-20: 100 mg via INTRAVENOUS

## 2018-05-20 MED ORDER — SODIUM CHLORIDE 0.9 % IV SOLN: INTRAVENOUS | Status: DC

## 2018-05-20 MED ORDER — PROPOFOL 500 MG/50ML IV EMUL
INTRAVENOUS | Status: DC | PRN
  Administered 2018-05-20: 125 ug/kg/min via INTRAVENOUS

## 2018-05-20 SURGICAL SUPPLY — 21 items

## 2018-05-20 NOTE — Anesthesia Preprocedure Evaluation (Signed)
Anesthesia Evaluation  Patient identified by MRN, date of birth, ID band Patient awake    Reviewed: Allergy & Precautions, NPO status , Patient's Chart, lab work & pertinent test results  Airway Mallampati: II  TM Distance: >3 FB Neck ROM: Full    Dental no notable dental hx.    Pulmonary sleep apnea and Continuous Positive Airway Pressure Ventilation , former smoker,    Pulmonary exam normal breath sounds clear to auscultation       Cardiovascular hypertension, Pt. on medications Normal cardiovascular exam Rhythm:Regular Rate:Normal     Neuro/Psych negative neurological ROS  negative psych ROS   GI/Hepatic negative GI ROS, Neg liver ROS,   Endo/Other  negative endocrine ROSMorbid obesity  Renal/GU negative Renal ROS  negative genitourinary   Musculoskeletal negative musculoskeletal ROS (+)   Abdominal   Peds negative pediatric ROS (+)  Hematology negative hematology ROS (+)   Anesthesia Other Findings   Reproductive/Obstetrics negative OB ROS                             Anesthesia Physical Anesthesia Plan  ASA: III  Anesthesia Plan: MAC   Post-op Pain Management:    Induction:   PONV Risk Score and Plan: 1 and Treatment may vary due to age or medical condition  Airway Management Planned: Simple Face Mask  Additional Equipment:   Intra-op Plan:   Post-operative Plan:   Informed Consent: I have reviewed the patients History and Physical, chart, labs and discussed the procedure including the risks, benefits and alternatives for the proposed anesthesia with the patient or authorized representative who has indicated his/her understanding and acceptance.   Dental advisory given  Plan Discussed with: CRNA  Anesthesia Plan Comments:         Anesthesia Quick Evaluation

## 2018-05-20 NOTE — Discharge Instructions (Signed)

## 2018-05-20 NOTE — H&P (Signed)
HPI: Charles Morales is a 57 year old male with history of hypertension, hyperlipidemia, sleep apnea and adenomatous colon polyp who presents for surveillance colonoscopy.  No complaint.  Was having some painless rectal bleeding with bowel movement intermittently but started fiber supplementation to help with regularity and mild constipation.  With this the painless rectal bleeding has been significantly less and none in the last several weeks.  No abdominal pain.  No other GI, upper GI or hepatobiliary complaint.  Denies chest pain or shortness of breath today.  Tolerated the prep well.  Did Suprep.  Last colonoscopy September 2013 --5 mm adenoma removed from the descending colon, diverticulosis sigmoid and descending, internal hemorrhoids   Past Medical History:  Diagnosis Date  . Allergy   . Arthritis    right knee  . Esophageal reflux   . Fatty liver   . HLD (hyperlipidemia)   . HTN (hypertension)   . Morbid obesity ([place])   . Sleep apnea    USES CPAP   . Tobacco use disorder   . Unspecified sleep apnea     Past Surgical History:  Procedure Laterality Date  . COLONOSCOPY  2013  . KNEE ARTHROSCOPY     left   Outpatients Meds --aspirin, Zyrtec, B12, fiber capsules, lisinopril, hydrochlorothiazide, omeprazole, B6, fatty acids  No Known Allergies  Family History  Problem Relation Age of Onset  . Hypertension Mother   . Heart attack Mother        deceased  . Heart disease Mother   . Heart attack Father 70       deceased  . Heart disease Father   . Colon cancer Neg Hx   . Stomach cancer Neg Hx   . Esophageal cancer Neg Hx   . Rectal cancer Neg Hx     Social History   Tobacco Use  . Smoking status: Former Smoker    Last attempt to quit: 12/31/1982    Years since quitting: 35.4  . Smokeless tobacco: Current User    Types: Snuff  Substance Use Topics  . Alcohol use: Yes    Alcohol/week: 0.0 oz    Comment: OCC BEER  . Drug use: No    ROS: As per history of present  illness, otherwise negative  BP 136/79   Pulse 66   Temp 98.1 F (36.7 C) (Oral)   Resp 20   Ht 5\' 10"  (1.778 m)   Wt (!) 360 lb (163.3 kg)   SpO2 95%   BMI 51.65 kg/m  Gen: awake, alert, NAD HEENT: anicteric, op clear CV: RRR, no mrg Pulm: CTA b/l Abd: soft, obese, NT/ND, +BS throughout Ext: no c/c/e Neuro: nonfocal   RELEVANT LABS AND IMAGING: CBC    Component Value Date/Time   WBC 6.3 05/06/2017 0816   RBC 4.79 05/06/2017 0816   HGB 14.5 05/06/2017 0816   HCT 43.1 05/06/2017 0816   PLT 186.0 05/06/2017 0816   MCV 90.0 05/06/2017 0816   MCHC 33.6 05/06/2017 0816   RDW 15.3 05/06/2017 0816   LYMPHSABS 1.9 05/06/2017 0816   MONOABS 0.6 05/06/2017 0816   EOSABS 0.1 05/06/2017 0816   BASOSABS 0.0 05/06/2017 0816    CMP     Component Value Date/Time   NA 139 05/06/2017 0816   K 3.9 05/06/2017 0816   CL 105 05/06/2017 0816   CO2 27 05/06/2017 0816   GLUCOSE 107 (H) 05/06/2017 0816   BUN 15 05/06/2017 0816   CREATININE 0.86 05/06/2017 0816   CALCIUM 9.2 05/06/2017  0816   PROT 6.9 05/06/2017 0816   ALBUMIN 4.0 05/06/2017 0816   AST 27 05/06/2017 0816   ALT 40 05/06/2017 0816   ALKPHOS 38 (L) 05/06/2017 0816   BILITOT 0.5 05/06/2017 0816   GFRNONAA 86 08/04/2007 1034   GFRAA 104 08/04/2007 1034    ASSESSMENT/PLAN: 57 year old male with history of hypertension, hyperlipidemia, sleep apnea and adenomatous colon polyp who presents for surveillance colonoscopy.  1.  History of adenomatous colon polyps --surveillance colonoscopy today with monitored anesthesia care. The nature of the procedure, as well as the risks, benefits, and alternatives were carefully and thoroughly reviewed with the patient. Ample time for discussion and questions allowed. The patient understood, was satisfied, and agreed to proceed.

## 2018-05-20 NOTE — Transfer of Care (Signed)
Immediate Anesthesia Transfer of Care Note  Patient: Charles Morales  Procedure(s) Performed: COLONOSCOPY WITH PROPOFOL (N/A )  Patient Location: PACU and Endoscopy Unit  Anesthesia Type:MAC  Level of Consciousness: awake and patient cooperative  Airway & Oxygen Therapy: Patient Spontanous Breathing and Patient connected to face mask oxygen  Post-op Assessment: Report given to RN and Post -op Vital signs reviewed and stable  Post vital signs: Reviewed and stable  Last Vitals:  Vitals Value Taken Time  BP    Temp    Pulse 86 05/20/2018 11:32 AM  Resp 15 05/20/2018 11:32 AM  SpO2 100 % 05/20/2018 11:32 AM  Vitals shown include unvalidated device data.  Last Pain:  Vitals:   05/20/18 0941  TempSrc: Oral  PainSc: 0-No pain         Complications: No apparent anesthesia complications

## 2018-05-20 NOTE — Op Note (Signed)
[facility] Patient Name: Charles Morales Procedure Date: 05/20/2018 MRN: [id number] Attending MD: Jerene Bears , MD Date of Birth: 03/31/1961 CSN: [id number] Age: 57 Admit Type: Inpatient Procedure:                Colonoscopy Indications:              Surveillance: Personal history of adenomatous                            polyps on last colonoscopy > 5 years ago, Last                            colonoscopy: September 2013 Providers:                Lajuan Lines. Hilarie Fredrickson, MD, Baird Cancer, RN, Alan Mulder,                            Technician, Dione Booze, CRNA Referring MD:             Wynelle Fanny. Tower Medicines:                Monitored Anesthesia Care Complications:            No immediate complications. Estimated Blood Loss:     Estimated blood loss: none. Procedure:                Pre-Anesthesia Assessment:                           - Prior to the procedure, a History and Physical                            was performed, and patient medications and                            allergies were reviewed. The patient's tolerance of                            previous anesthesia was also reviewed. The risks                            and benefits of the procedure and the sedation                            options and risks were discussed with the patient.                            All questions were answered, and informed consent                            was obtained. Prior Anticoagulants: The patient has                            taken no previous anticoagulant or antiplatelet  agents. ASA Grade Assessment: III - A patient with                            severe systemic disease. After reviewing the risks                            and benefits, the patient was deemed in                            satisfactory condition to undergo the procedure.                           After obtaining informed consent, the colonoscope        was passed under direct vision. Throughout the                            procedure, the patient's blood pressure, pulse, and                            oxygen saturations were monitored continuously. The                            EC-[id number] ([id number]) scope was introduced through                            the anus and advanced to the cecum, identified by                            appendiceal orifice and ileocecal valve. The                            colonoscopy was performed without difficulty. The                            patient tolerated the procedure well. The quality                            of the bowel preparation was good. The ileocecal                            valve, appendiceal orifice, and rectum were                            photographed. Scope In: 11:04:30 AM Scope Out: 11:25:41 AM Scope Withdrawal Time: 0 hours 15 minutes 11 seconds  Total Procedure Duration: 0 hours 21 minutes 11 seconds  Findings:      The digital rectal exam was normal.      Retroflexion in the right colon was performed and unremarkable.      Multiple small and large-mouthed diverticula were found in the sigmoid       colon and descending colon.      The exam was otherwise without abnormality. No colon polyps were found.      Internal hemorrhoids were found during retroflexion. The hemorrhoids  were small. Impression:               - Moderate diverticulosis in the sigmoid colon and                            in the descending colon.                           - The examination was otherwise normal.                           - Small internal hemorrhoids.                           - No specimens collected. Moderate Sedation:      N/A Recommendation:           - Patient has a contact number available for                            emergencies. The signs and symptoms of potential                            delayed complications were discussed with the                             patient. Return to normal activities tomorrow.                            Written discharge instructions were provided to the                            patient.                           - Resume previous diet.                           - Continue present medications.                           - If hemorrhoidal bleeding is a recurrent problem,                            then contact my office for further treatment. In                            office hemorrhoidal banding is a good option for                            symptomatic internal hemorrhoids.                           - Repeat colonoscopy in 10 years for screening                            purposes. Procedure Code(s):        ---  Professional ---                           L5726, Colorectal cancer screening; colonoscopy on                            individual at high risk Diagnosis Code(s):        --- Professional ---                           Z86.010, Personal history of colonic polyps                           K64.8, Other hemorrhoids                           K57.30, Diverticulosis of large intestine without                            perforation or abscess without bleeding CPT copyright 2017 American Medical Association. All rights reserved. The codes documented in this report are preliminary and upon coder review may  be revised to meet current compliance requirements. Jerene Bears, MD 05/20/2018 11:32:25 AM This report has been signed electronically. Number of Addenda: 0

## 2018-05-20 NOTE — Anesthesia Postprocedure Evaluation (Signed)
Anesthesia Post Note  Patient: Charles Morales  Procedure(s) Performed: COLONOSCOPY WITH PROPOFOL (N/A )     Patient location during evaluation: Endoscopy Anesthesia Type: MAC Level of consciousness: awake and alert Pain management: pain level controlled Vital Signs Assessment: post-procedure vital signs reviewed and stable Respiratory status: spontaneous breathing, nonlabored ventilation, respiratory function stable and patient connected to nasal cannula oxygen Cardiovascular status: stable and blood pressure returned to baseline Postop Assessment: no apparent nausea or vomiting Anesthetic complications: no    Last Vitals:  Vitals:   05/20/18 1139 05/20/18 1148  BP: 114/81 127/83  Pulse: 68 64  Resp: 19 13  Temp:    SpO2: 100% 97%    Last Pain:  Vitals:   05/20/18 1148  TempSrc:   PainSc: 3                  Montez Hageman

## 2018-05-20 NOTE — Anesthesia Procedure Notes (Signed)
Procedure Name: MAC Date/Time: 05/20/2018 10:57 AM Performed by: Dione Booze, CRNA Pre-anesthesia Checklist: Patient identified, Emergency Drugs available, Suction available and Patient being monitored Patient Re-evaluated:Patient Re-evaluated prior to induction Oxygen Delivery Method: Simple face mask Placement Confirmation: positive ETCO2

## 2018-05-21 ENCOUNTER — Encounter (HOSPITAL_COMMUNITY): Payer: Self-pay | Admitting: Internal Medicine

## 2018-06-03 DIAGNOSIS — G4733 Obstructive sleep apnea (adult) (pediatric): Secondary | ICD-10-CM | POA: Diagnosis not present

## 2018-06-13 ENCOUNTER — Other Ambulatory Visit: Payer: Self-pay | Admitting: *Deleted

## 2018-06-13 MED ORDER — LISINOPRIL-HYDROCHLOROTHIAZIDE 20-25 MG PO TABS: 1.0000 | ORAL_TABLET | Freq: Every day | ORAL | 0 refills | Status: DC

## 2018-06-13 NOTE — Telephone Encounter (Signed)
No recent or future appts., please advise  

## 2018-06-13 NOTE — Telephone Encounter (Signed)
Please schedule f/u and refill until then  

## 2018-06-13 NOTE — Telephone Encounter (Signed)
Med refilled once and Charles Morales will try to reach out to pt to get appt scheduled

## 2018-06-16 MED ORDER — OMEPRAZOLE 40 MG PO CPDR: DELAYED_RELEASE_CAPSULE | ORAL | 0 refills | Status: DC

## 2018-06-16 NOTE — Addendum Note (Signed)
Addended by: Tammi Sou on: 06/16/2018 04:42 PM   Modules accepted: Orders

## 2018-07-03 DIAGNOSIS — G4733 Obstructive sleep apnea (adult) (pediatric): Secondary | ICD-10-CM | POA: Diagnosis not present

## 2018-07-14 ENCOUNTER — Other Ambulatory Visit: Payer: Self-pay | Admitting: *Deleted

## 2018-07-14 MED ORDER — LISINOPRIL-HYDROCHLOROTHIAZIDE 20-25 MG PO TABS: 1.0000 | ORAL_TABLET | Freq: Every day | ORAL | 1 refills | Status: DC

## 2018-07-24 ENCOUNTER — Other Ambulatory Visit: Payer: Self-pay | Admitting: *Deleted

## 2018-07-24 MED ORDER — OMEPRAZOLE 40 MG PO CPDR: DELAYED_RELEASE_CAPSULE | ORAL | 5 refills | Status: DC

## 2018-08-15 ENCOUNTER — Ambulatory Visit: Payer: 59 | Admitting: Internal Medicine

## 2018-08-15 ENCOUNTER — Encounter: Payer: Self-pay | Admitting: Internal Medicine

## 2018-08-15 VITALS — BP 110/70 | HR 63 | Ht 70.0 in | Wt 356.0 lb

## 2018-08-15 DIAGNOSIS — G4733 Obstructive sleep apnea (adult) (pediatric): Secondary | ICD-10-CM | POA: Diagnosis not present

## 2018-08-15 NOTE — Patient Instructions (Signed)
Continue CPAP as prescribed Recommend weight loss

## 2018-08-15 NOTE — Progress Notes (Signed)
Name: Charles Morales MRN: [id number] DOB: 1961/09/16     CONSULTATION DATE: 07/30/17 REFERRING MD : Glori Bickers   CHIEF COMPLAINT: I have sleep apnea  STUDIES:  Chest x-ray 07/16/2007 images reviewed 07/30/2017 Interpretation no acute findings no focal opacities no pneumonia   Previous HISTORY OF PRESENT ILLNESS: 57 year old pleasant white male seen today for a diagnosis of obstructive sleep apnea Patient was diagnosed with sleep apnea at [facility] in 2002 Patient has been using and benefiting from CPAP therapy at 10 cm of water pressure Patient uses a nasal mask but sometimes he says his mouth is open during his usage If patient is not using his CPAP he feels very fatigued has extreme excessive daytime sleepiness and very lethargic Patient weighs 360 pounds 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, 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 Patient does not smoke or does not drink alcohol   HPI Patient does have reflux but is controlled with his medicines he also has allergic rhinitis but is controlled with antihistamines  Patient is not showing signs of congestive heart failure at this time Signs of infection at this time  Patient denies any chest pain or palpitations No shortness of breath no dyspnea on exertion  Compliance report 100% compliance AHI 4  Patient benefiting  and using CPAP daily  PAST MEDICAL HISTORY :   has a past medical history of Allergy, Arthritis, Esophageal reflux, Fatty liver, HLD (hyperlipidemia), HTN (hypertension), Morbid obesity ([place]), Sleep apnea, Tobacco use disorder, and Unspecified sleep apnea.  has a past surgical history that includes Knee arthroscopy; Colonoscopy (2013); and Colonoscopy with propofol (N/A, 05/20/2018). Prior to Admission medications   Medication Sig Start Date End Date Taking? Authorizing Provider  ALPRAZolam Duanne Moron) 0.5 MG tablet TAKE 1 TABLET BY MOUTH EVERY DAY AS NEEDED FOR ANXIETY 02/26/17  Yes Tower, Wynelle Fanny, MD  aspirin 81 MG tablet  Take 81 mg by mouth daily.     Yes [provider]  cetirizine (ZYRTEC) 10 MG tablet Take 10 mg by mouth daily.   Yes [provider]  Cinnamon 500 MG capsule Take 500 mg by mouth daily.     Yes [provider]  Coenzyme Q10 (CO Q 10) 100 MG CAPS Take 1 capsule by mouth daily.     Yes [provider]  Cyanocobalamin (B-12 PO) Take 1 capsule by mouth daily.   Yes [provider]  fish oil-omega-3 fatty acids 1000 MG capsule Take 2 g by mouth 2 (two) times daily.     Yes [provider]  glucosamine-chondroitin 500-400 MG tablet Take 1 tablet by mouth daily.     Yes [provider]  lisinopril-hydrochlorothiazide (PRINZIDE,ZESTORETIC) 20-25 MG tablet Take 1 tablet by mouth daily. 05/20/17  Yes Tower, Wynelle Fanny, MD  NON FORMULARY CPAP at night as directed    Yes [provider]  omeprazole (PRILOSEC) 40 MG capsule TAKE 1 CAPSULE (40 MG TOTAL) BY MOUTH DAILY. 05/20/17  Yes Tower, Wynelle Fanny, MD  pyridoxine (B-6) 200 MG tablet Take 200 mg by mouth daily.     Yes [provider]  Red Yeast Rice 600 MG CAPS Take 1 capsule by mouth daily.     Yes [provider]  Turmeric 500 MG CAPS Take 2 capsules by mouth daily.   Yes [provider]   No Known Allergies  FAMILY HISTORY:  family history includes Heart attack in his mother; Heart attack (age of onset: 74) in his father; Heart disease  in his father and mother; Hypertension in his mother. SOCIAL HISTORY:  reports that he quit smoking about 35 years ago. His smokeless tobacco use includes snuff. He reports that he drinks alcohol. He reports that he does not use drugs.  REVIEW OF SYSTEMS:   Constitutional: Negative for fever, chills, weight loss, malaise/fatigue and diaphoresis.  HENT: Negative for hearing loss, ear pain, nosebleeds, congestion, sore throat, neck pain, tinnitus and ear discharge.   Eyes: Negative for blurred vision, double vision, photophobia,  pain, discharge and redness.  Respiratory: Negative for cough, hemoptysis, sputum production, shortness of breath, wheezing and stridor.   Cardiovascular: Negative for chest pain, palpitations, orthopnea, claudication, leg swelling and PND.  ALL OTHER ROS ARE NEGATIVE  BP 110/70 (BP Location: Left Arm, Cuff Size: Normal)   Pulse 63   Ht 5\' 10"  (1.778 m)   Wt (!) 356 lb (161.5 kg)   SpO2 96%   BMI 51.08 kg/m     Physical Examination:   GENERAL:NAD, no fevers, chills, no weakness no fatigue HEAD: Normocephalic, atraumatic.  EYES: Pupils equal, round, reactive to light. Extraocular muscles intact. No scleral icterus.  MOUTH: Moist mucosal membrane.   EAR, NOSE, THROAT: Clear without exudates. No external lesions.  NECK: Supple. No thyromegaly. No nodules. No JVD.  PULMONARY:CTA B/L no wheezes, no crackles, no rhonchi CARDIOVASCULAR: S1 and S2. Regular rate and rhythm. No murmurs, rubs, or gallops. No edema.   ASSESSMENT / PLAN: 57 year old white male seen today having been diagnosed with sleep apnea in 2002  Patient doing well with therapy and AHI down to 4   At this time patient is morbidly obese and in the deconditioned state and I have explained to him the benefits of losing weight and exercising Patient will plan to see gastric bypass surgeon  #1 diagnosis of sleep apnea Continue CPAP as prescribed   #2 Obesity -recommend significant weight loss -recommend changing diet  #3 Deconditioned state -Recommend increased daily activity and exercise    Patient satisfied with Plan of action and management. All questions answered Follow up in 1 year   Genea Rheaume Patricia Pesa, M.D.  [facility]  Medical Director [place] [facility] Cardio-Pulmonary Department

## 2018-08-17 ENCOUNTER — Telehealth: Payer: Self-pay | Admitting: Family Medicine

## 2018-08-17 DIAGNOSIS — I10 Essential (primary) hypertension: Secondary | ICD-10-CM

## 2018-08-17 DIAGNOSIS — R739 Hyperglycemia, unspecified: Secondary | ICD-10-CM

## 2018-08-17 DIAGNOSIS — Z125 Encounter for screening for malignant neoplasm of prostate: Secondary | ICD-10-CM

## 2018-08-17 DIAGNOSIS — Z0000 Encounter for general adult medical examination without abnormal findings: Secondary | ICD-10-CM

## 2018-08-17 DIAGNOSIS — E7800 Pure hypercholesterolemia, unspecified: Secondary | ICD-10-CM

## 2018-08-17 NOTE — Telephone Encounter (Signed)
-----   Message from Lendon Collar, RT sent at 08/12/2018  9:19 AM EDT ----- Regarding: Lab orders for Monday 08/18/18 Please enter CPE lab orders for 08/18/18. Thanks-Lauren

## 2018-08-18 ENCOUNTER — Other Ambulatory Visit (INDEPENDENT_AMBULATORY_CARE_PROVIDER_SITE_OTHER): Payer: 59

## 2018-08-18 DIAGNOSIS — E7800 Pure hypercholesterolemia, unspecified: Secondary | ICD-10-CM

## 2018-08-18 DIAGNOSIS — R739 Hyperglycemia, unspecified: Secondary | ICD-10-CM

## 2018-08-18 DIAGNOSIS — I10 Essential (primary) hypertension: Secondary | ICD-10-CM

## 2018-08-18 DIAGNOSIS — Z125 Encounter for screening for malignant neoplasm of prostate: Secondary | ICD-10-CM

## 2018-08-18 LAB — CBC WITH DIFFERENTIAL/PLATELET
Basophils Absolute: 0 10*3/uL (ref 0.0–0.1)
Basophils Relative: 0.3 % (ref 0.0–3.0)
EOS ABS: 0.2 10*3/uL (ref 0.0–0.7)
Eosinophils Relative: 2.3 % (ref 0.0–5.0)
HCT: 43.6 % (ref 39.0–52.0)
Hemoglobin: 14.5 g/dL (ref 13.0–17.0)
LYMPHS ABS: 2 10*3/uL (ref 0.7–4.0)
Lymphocytes Relative: 27.9 % (ref 12.0–46.0)
MCHC: 33.2 g/dL (ref 30.0–36.0)
MCV: 90.5 fl (ref 78.0–100.0)
Monocytes Absolute: 0.8 10*3/uL (ref 0.1–1.0)
Monocytes Relative: 10.6 % (ref 3.0–12.0)
NEUTROS ABS: 4.3 10*3/uL (ref 1.4–7.7)
NEUTROS PCT: 58.9 % (ref 43.0–77.0)
PLATELETS: 168 10*3/uL (ref 150.0–400.0)
RBC: 4.82 Mil/uL (ref 4.22–5.81)
RDW: 15.2 % (ref 11.5–15.5)
WBC: 7.3 10*3/uL (ref 4.0–10.5)

## 2018-08-18 LAB — COMPREHENSIVE METABOLIC PANEL
ALT: 23 U/L (ref 0–53)
AST: 17 U/L (ref 0–37)
Albumin: 3.8 g/dL (ref 3.5–5.2)
Alkaline Phosphatase: 42 U/L (ref 39–117)
BILIRUBIN TOTAL: 0.6 mg/dL (ref 0.2–1.2)
BUN: 19 mg/dL (ref 6–23)
CO2: 29 meq/L (ref 19–32)
CREATININE: 1.17 mg/dL (ref 0.40–1.50)
Calcium: 9.2 mg/dL (ref 8.4–10.5)
Chloride: 105 mEq/L (ref 96–112)
GFR: 68.54 mL/min (ref >60.00)
GLUCOSE: 99 mg/dL (ref 70–99)
Potassium: 3.9 mEq/L (ref 3.5–5.1)
Sodium: 141 mEq/L (ref 135–145)
Total Protein: 6.4 g/dL (ref 6.0–8.3)

## 2018-08-18 LAB — LIPID PANEL
Cholesterol: 132 mg/dL (ref 0–200)
HDL: 33.3 mg/dL — AB (ref >39.00)
LDL Cholesterol: 80 mg/dL (ref 0–99)
NONHDL: 99.13
Total CHOL/HDL Ratio: 4
Triglycerides: 95 mg/dL (ref 0.0–149.0)
VLDL: 19 mg/dL (ref 0.0–40.0)

## 2018-08-18 LAB — TSH: TSH: 0.96 u[IU]/mL (ref 0.35–4.50)

## 2018-08-18 LAB — PSA: PSA: 1.67 ng/mL (ref 0.10–4.00)

## 2018-08-18 LAB — HEMOGLOBIN A1C: HEMOGLOBIN A1C: 5.7 % (ref 4.6–6.5)

## 2018-08-26 ENCOUNTER — Encounter (INDEPENDENT_AMBULATORY_CARE_PROVIDER_SITE_OTHER): Payer: Self-pay

## 2018-08-26 ENCOUNTER — Ambulatory Visit (INDEPENDENT_AMBULATORY_CARE_PROVIDER_SITE_OTHER): Payer: 59 | Admitting: Family Medicine

## 2018-08-26 ENCOUNTER — Encounter: Payer: Self-pay | Admitting: Family Medicine

## 2018-08-26 VITALS — BP 125/82 | HR 73 | Temp 97.6°F | Ht 70.0 in | Wt 355.8 lb

## 2018-08-26 DIAGNOSIS — Z0000 Encounter for general adult medical examination without abnormal findings: Secondary | ICD-10-CM | POA: Diagnosis not present

## 2018-08-26 DIAGNOSIS — Z125 Encounter for screening for malignant neoplasm of prostate: Secondary | ICD-10-CM

## 2018-08-26 DIAGNOSIS — I10 Essential (primary) hypertension: Secondary | ICD-10-CM | POA: Diagnosis not present

## 2018-08-26 DIAGNOSIS — E7800 Pure hypercholesterolemia, unspecified: Secondary | ICD-10-CM | POA: Diagnosis not present

## 2018-08-26 DIAGNOSIS — R7303 Prediabetes: Secondary | ICD-10-CM

## 2018-08-26 MED ORDER — OMEPRAZOLE 40 MG PO CPDR: DELAYED_RELEASE_CAPSULE | ORAL | 11 refills | Status: DC

## 2018-08-26 MED ORDER — LISINOPRIL-HYDROCHLOROTHIAZIDE 20-25 MG PO TABS: 1.0000 | ORAL_TABLET | Freq: Every day | ORAL | 11 refills | Status: DC

## 2018-08-26 NOTE — Assessment & Plan Note (Signed)
bp in fair control at this time  BP Readings from Last 1 Encounters:  08/26/18 125/82   No changes needed Most recent labs reviewed  Disc lifstyle change with low sodium diet and exercise  Refilled lisinopril hct Disc goal of wt loss

## 2018-08-26 NOTE — Assessment & Plan Note (Signed)
Lab Results  Component Value Date   PSA 1.67 08/18/2018   No symptoms or family hx Will continue to follow

## 2018-08-26 NOTE — Assessment & Plan Note (Signed)
Disc goals for lipids and reasons to control them Rev last labs with pt Rev low sat fat diet in detail LDL is 80 HDL still low at 33.3 -disc exercise/fish oil to help this

## 2018-08-26 NOTE — Assessment & Plan Note (Signed)
Reviewed health habits including diet and exercise and skin cancer prevention Reviewed appropriate screening tests for age  Also reviewed health mt list, fam hx and immunization status , as well as social and family history   See HPI Labs reviewed  Disc shingrix vaccine Will get flu shot at work  Disc need for wt loss and ref for gen surg done

## 2018-08-26 NOTE — Assessment & Plan Note (Signed)
Pt has not had long lasting success with wt loss plans and has bmi of 51 He is interested in pursuing bariatric surgery (with wife's surgeon)  Low HDL and HTN -co morbidities Also knee OA affecting mobility and OSA Ref done  Discussed how this problem influences overall health and the risks it imposes  Reviewed plan for weight loss with lower calorie diet (via better food choices and also portion control or program like weight watchers) and exercise building up to or more than 30 minutes 5 days per week including some aerobic activity

## 2018-08-26 NOTE — Progress Notes (Signed)
Subjective:    Patient ID: Charles Morales, male    DOB: May 04, 1961, 57 y.o.   MRN: [id number]  HPI  Here for health maintenance exam and to review chronic medical problems    Had vacation this summer - nice long break at [facility] Readings from Last 3 Encounters:  08/15/18 (!) 356 lb (161.5 kg)  05/20/18 (!) 360 lb (163.3 kg)  03/05/18 (!) 360 lb (163.3 kg)  51.05 kg/m     Taking care of himself the best he can  Ate too much on vacation  Diet - he changed this and lost 25 lb -gained it back (he fell off the wagon)  Fast food is a weakness  Not an emotional eater  Exercise -limited by knee problems   Still getting knee injections  Cannot get knee replacement in the future until he loses weight    Sister was diagnosed with breast cancer - worried quite a bit about her  She had surgery today   Wife had the gastric sleeve procedure ([facility])  Interested in this as well (he went to her seminar as well)   Flu shot -will get at work in the fall   Tetanus shot 5/13  Colonoscopy 5/19 - mild diverticulosis  Recall is 10 y / no polyps   Zoster status - interested   Prostate health  Lab Results  Component Value Date   PSA 1.67 08/18/2018   no prostate problems  Nocturia- only occasionally -perhaps once in 2 weeks    bp is up a bit today- very stressful day/ up at 4:30 and sister had sugery  No cp or palpitations or headaches or edema  No side effects to medicines  BP Readings from Last 3 Encounters:  08/26/18 140/88  08/15/18 110/70  05/20/18 127/83     Elevated glucose  Lab Results  Component Value Date   HGBA1C 5.7 08/18/2018  stable from last check   Hyperlipidemia Lab Results  Component Value Date   CHOL 132 08/18/2018   CHOL 136 05/06/2017   CHOL 143 10/26/2016   Lab Results  Component Value Date   HDL 33.30 (L) 08/18/2018   HDL 35.60 (L) 05/06/2017   HDL 35.50 (L) 10/26/2016   Lab Results  Component Value Date   LDLCALC 80  08/18/2018   LDLCALC 88 05/06/2017   LDLCALC 93 10/26/2016   Lab Results  Component Value Date   TRIG 95.0 08/18/2018   TRIG 58.0 05/06/2017   TRIG 71.0 10/26/2016   Lab Results  Component Value Date   CHOLHDL 4 08/18/2018   CHOLHDL 4 05/06/2017   CHOLHDL 4 10/26/2016   No results found for: LDLDIRECT Red yeast rice-stopped it  Still takes co Q 10  Also fish oil  Other labs  Lab Results  Component Value Date   CREATININE 1.17 08/18/2018   BUN 19 08/18/2018   NA 141 08/18/2018   K 3.9 08/18/2018   CL 105 08/18/2018   CO2 29 08/18/2018   Lab Results  Component Value Date   ALT 23 08/18/2018   AST 17 08/18/2018   ALKPHOS 42 08/18/2018   BILITOT 0.6 08/18/2018   Lab Results  Component Value Date   WBC 7.3 08/18/2018   HGB 14.5 08/18/2018   HCT 43.6 08/18/2018   MCV 90.5 08/18/2018   PLT 168.0 08/18/2018    Lab Results  Component Value Date   TSH 0.96 08/18/2018      Patient Active Problem  List   Diagnosis Date Noted  . Prostate cancer screening 08/17/2018  . History of colonic polyps   . Prediabetes 04/01/2016  . Left ankle pain 10/14/2015  . PVC's (premature ventricular contractions) 10/22/2013  . Colon cancer screening 05/23/2012  . Routine general medical examination at a health care facility 05/20/2012  . Knee pain, right 06/08/2011  . G E R D 08/04/2007  . Hyperlipidemia 07/22/2007  . Morbid obesity ([place]) 07/22/2007  . SMOKELESS TOBACCO ABUSE 07/22/2007  . Essential hypertension 07/22/2007  . Sleep apnea 07/22/2007   Past Medical History:  Diagnosis Date  . Allergy   . Arthritis    right knee  . Esophageal reflux   . Fatty liver   . HLD (hyperlipidemia)   . HTN (hypertension)   . Morbid obesity ([place])   . Sleep apnea    USES CPAP   . Tobacco use disorder   . Unspecified sleep apnea    Past Surgical History:  Procedure Laterality Date  . COLONOSCOPY  2013  . COLONOSCOPY WITH PROPOFOL N/A 05/20/2018   Procedure: COLONOSCOPY WITH  PROPOFOL;  Surgeon: Jerene Bears, MD;  Location: [facility];  Service: Gastroenterology;  Laterality: N/A;  . KNEE ARTHROSCOPY     left   Social History   Tobacco Use  . Smoking status: Former Smoker    Last attempt to quit: 12/31/1982    Years since quitting: 35.6  . Smokeless tobacco: Current User    Types: Snuff  Substance Use Topics  . Alcohol use: Yes    Alcohol/week: 0.0 standard drinks    Comment: OCC BEER  . Drug use: No   Family History  Problem Relation Age of Onset  . Hypertension Mother   . Heart attack Mother        deceased  . Heart disease Mother   . Heart attack Father 78       deceased  . Heart disease Father   . Breast cancer Sister   . Colon cancer Neg Hx   . Stomach cancer Neg Hx   . Esophageal cancer Neg Hx   . Rectal cancer Neg Hx    No Known Allergies Current Outpatient Medications on File Prior to Visit  Medication Sig Dispense Refill  . Acetaminophen (TYLENOL ARTHRITIS PAIN PO) Take 650 mg by mouth 2 (two) times daily.    Marland Kitchen aspirin 325 MG tablet Take 325 mg by mouth daily.    . cetirizine (ZYRTEC) 10 MG tablet Take 10 mg by mouth daily.    . Cyanocobalamin (B-12 PO) Take 2 tablets by mouth daily. 2000 mcg daily    . FIBER PO Take 2 tablets by mouth daily.     Marland Kitchen GLUCOSAMINE-CHONDROITIN PO Take 2 tablets by mouth daily. 1500    . NON FORMULARY CPAP at night as directed     . Omega-3 Fatty Acids (FISH OIL) 1200 MG CPDR Take 2 capsules by mouth 2 (two) times daily.    Marland Kitchen pyridoxine (B-6) 100 MG tablet Take 100 mg by mouth daily.      No current facility-administered medications on file prior to visit.     Review of Systems  Constitutional: Positive for fatigue. Negative for activity change, appetite change, fever and unexpected weight change.  HENT: Negative for congestion, rhinorrhea, sore throat and trouble swallowing.   Eyes: Negative for pain, redness, itching and visual disturbance.  Respiratory: Negative for cough, chest tightness,  shortness of breath and wheezing.   Cardiovascular: Negative for chest pain  and palpitations.  Gastrointestinal: Negative for abdominal pain, blood in stool, constipation, diarrhea and nausea.  Endocrine: Negative for cold intolerance, heat intolerance, polydipsia and polyuria.  Genitourinary: Negative for difficulty urinating, dysuria, frequency and urgency.  Musculoskeletal: Positive for arthralgias. Negative for joint swelling and myalgias.  Skin: Negative for pallor and rash.  Neurological: Negative for dizziness, tremors, weakness, numbness and headaches.  Hematological: Negative for adenopathy. Does not bruise/bleed easily.  Psychiatric/Behavioral: Negative for decreased concentration and dysphoric mood. The patient is not nervous/anxious.        Objective:   Physical Exam  Constitutional: He appears well-developed and well-nourished. No distress.  Morbidly obese and well appearing   HENT:  Head: Normocephalic and atraumatic.  Right Ear: External ear normal.  Left Ear: External ear normal.  Nose: Nose normal.  Mouth/Throat: Oropharynx is clear and moist.  Eyes: Pupils are equal, round, and reactive to light. Conjunctivae and EOM are normal. Right eye exhibits no discharge. Left eye exhibits no discharge. No scleral icterus.  Neck: Normal range of motion. Neck supple. No JVD present. Carotid bruit is not present. No thyromegaly present.  Cardiovascular: Normal rate, regular rhythm, normal heart sounds and intact distal pulses. Exam reveals no gallop.  Pulmonary/Chest: Effort normal and breath sounds normal. No stridor. No respiratory distress. He has no wheezes. He exhibits no tenderness.  Abdominal: Soft. Bowel sounds are normal. He exhibits no distension, no abdominal bruit and no mass. There is no tenderness.  Musculoskeletal: He exhibits no edema or tenderness.  Lymphadenopathy:    He has no cervical adenopathy.  Neurological: He is alert. He has normal reflexes. He displays  normal reflexes. No cranial nerve deficit. He exhibits normal muscle tone. Coordination normal.  Skin: Skin is warm and dry. No rash noted. No erythema. No pallor.  Solar lentigines diffusely   Psychiatric: He has a normal mood and affect.  Pleasant           Assessment & Plan:   Problem List Items Addressed This Visit      Cardiovascular and Mediastinum   Essential hypertension    bp in fair control at this time  BP Readings from Last 1 Encounters:  08/26/18 125/82   No changes needed Most recent labs reviewed  Disc lifstyle change with low sodium diet and exercise  Refilled lisinopril hct Disc goal of wt loss       Relevant Medications   lisinopril-hydrochlorothiazide (PRINZIDE,ZESTORETIC) 20-25 MG tablet     Other   Hyperlipidemia    Disc goals for lipids and reasons to control them Rev last labs with pt Rev low sat fat diet in detail LDL is 80 HDL still low at 33.3 -disc exercise/fish oil to help this        Relevant Medications   lisinopril-hydrochlorothiazide (PRINZIDE,ZESTORETIC) 20-25 MG tablet   Morbid obesity (HCC)    Pt has not had long lasting success with wt loss plans and has bmi of 51 He is interested in pursuing bariatric surgery (with wife's surgeon)  Low HDL and HTN -co morbidities Also knee OA affecting mobility and OSA Ref done  Discussed how this problem influences overall health and the risks it imposes  Reviewed plan for weight loss with lower calorie diet (via better food choices and also portion control or program like weight watchers) and exercise building up to or more than 30 minutes 5 days per week including some aerobic activity         Relevant Orders   Ambulatory referral  to General Surgery   Prediabetes    Lab Results  Component Value Date   HGBA1C 5.7 08/18/2018   This is stable disc imp of low glycemic diet and wt loss to prevent DM2       Prostate cancer screening    Lab Results  Component Value Date   PSA 1.67  08/18/2018   No symptoms or family hx Will continue to follow      Routine general medical examination at a health care facility - Primary    Reviewed health habits including diet and exercise and skin cancer prevention Reviewed appropriate screening tests for age  Also reviewed health mt list, fam hx and immunization status , as well as social and family history   See HPI Labs reviewed  Disc shingrix vaccine Will get flu shot at work  Disc need for wt loss and ref for gen surg done

## 2018-08-26 NOTE — Patient Instructions (Addendum)
Don't forget to get your flu shot in the fall   If you are interested in the new shingles vaccine (Shingrix) - call your local pharmacy to check on coverage and availability  If affordable -get on a wait list at your pharmacy   Take care of yourself   I did a general surgery referral to consider for bariatric surgery

## 2018-08-26 NOTE — Assessment & Plan Note (Signed)
Lab Results  Component Value Date   HGBA1C 5.7 08/18/2018   This is stable disc imp of low glycemic diet and wt loss to prevent DM2

## 2018-09-02 ENCOUNTER — Inpatient Hospital Stay: Payer: 59 | Attending: Oncology

## 2018-09-02 ENCOUNTER — Ambulatory Visit (HOSPITAL_BASED_OUTPATIENT_CLINIC_OR_DEPARTMENT_OTHER): Payer: 59 | Admitting: Genetics

## 2018-09-02 ENCOUNTER — Encounter: Payer: Self-pay | Admitting: Genetics

## 2018-09-02 DIAGNOSIS — Z803 Family history of malignant neoplasm of breast: Secondary | ICD-10-CM | POA: Diagnosis not present

## 2018-09-02 DIAGNOSIS — Z800 Family history of malignant neoplasm of digestive organs: Secondary | ICD-10-CM | POA: Diagnosis not present

## 2018-09-02 DIAGNOSIS — Z8481 Family history of carrier of genetic disease: Secondary | ICD-10-CM

## 2018-09-02 NOTE — Progress Notes (Signed)
REFERRING PROVIDER: Self/sister  PRIMARY PROVIDER:  Abner Greenspan, MD  PRIMARY REASON FOR VISIT:  1. Family history of breast cancer   2. Family history of pancreatic cancer   3. Family history of genetic disease carrier     HISTORY OF PRESENT ILLNESS:   Mr. Attar, a 57 y.o. male, was seen for a [facility] cancer genetics consultation at the request of Dr. 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  Mr. Arnette presents to clinic today to discuss the possibility of a hereditary predisposition to cancer, genetic testing, and to further clarify his future cancer risks, as well as potential cancer risks for family members.   Mr. Thau is a 57 y.o. male with no personal history of cancer.    Personal history of some colon polyps, most recent colonoscopy 05/20/2018 no polyps identified.   Past Medical History:  Diagnosis Date  . Allergy   . Arthritis    right knee  . Esophageal reflux   . Family history of breast cancer   . Family history of genetic disease carrier    sister NBN pathogenic variant  . Family history of pancreatic cancer   . Fatty liver   . HLD (hyperlipidemia)   . HTN (hypertension)   . Morbid obesity ([place])   . Sleep apnea    USES CPAP   . Tobacco use disorder   . Unspecified sleep apnea     Past Surgical History:  Procedure Laterality Date  . COLONOSCOPY  2013  . COLONOSCOPY WITH PROPOFOL N/A 05/20/2018   Procedure: COLONOSCOPY WITH PROPOFOL;  Surgeon: Jerene Bears, MD;  Location: [facility];  Service: Gastroenterology;  Laterality: N/A;  . KNEE ARTHROSCOPY     left    Social History   Socioeconomic History  . Marital status: Married    Spouse name: Not on file  . Number of children: 0  . Years of education: Not on file  . Highest education level: Not on file  Occupational History  . 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 Financial  resource strain: Not on file  . Food insecurity:    Worry: Not on file    Inability: Not on file  . Transportation needs:    Medical: Not on file    Non-medical: Not on file  Tobacco Use  . Smoking status: Former Smoker    Last attempt to quit: 12/31/1982    Years since quitting: 35.6  . Smokeless tobacco: Current User    Types: Snuff  Substance and Sexual Activity  . Alcohol use: Yes    Alcohol/week: 0.0 standard drinks    Comment: OCC BEER  . Drug use: No  . Sexual activity: Not on file  Lifestyle  . Physical activity:    Days per week: Not on file    Minutes per session: Not on file  . Stress: Not on file  Relationships  . Social connections:    Talks on phone: Not on file    Gets together: Not on file    Attends religious service: Not on file    Active member of club or organization: Not on file    Attends meetings of clubs or organizations: Not on file    Relationship status: Not on file  Other Topics Concern  . 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  Significant diagnoses are listed below: Family History  Problem Relation Age of Onset  . Hypertension Mother   . Heart attack Mother        deceased  . Heart disease Mother   . Kidney disease Mother   . Heart attack Father 15       deceased  . Heart disease Father   . Breast cancer Sister 59       NBN +  . Lung cancer Brother   . Kidney disease Maternal Grandfather   . Colon cancer Neg Hx   . Stomach cancer Neg Hx   . Esophageal cancer Neg Hx   . Rectal cancer Neg Hx     Mr. Dains has no children.  He has a sister who was recently diagnosed with triple negative breast cancer at 50.  She underwent genetic testing that revealed a NBN pathogenic variant c.2117C>G (p.Ser706*).  Her testing also revealed a variant of uncertain significance in the gene BRCA1 c.77T>C (p.Ile26Thr). This sister has 2 children. Mr.  Imperato also has a paternal half-brother who had lung cancer and has 1 daughter.   Mr. Fury father: died at 69 Paternal Aunts/Uncles: 1 paternal aunt died in her 72's- no history of cancer Paternal cousins: no history of cancer Paternal grandfather: unk Paternal grandmother:died in her 80's/90's cause unk  Mr. Colden mother: died at 1 of kidney disease- she had a hysterectomy Maternal Aunts/Uncles: 6 maternal aunts/uncles with no history of cancer. Maternal cousins: 1 maternal cousin died of pancreatic cancer >50 Maternal grandfather: died of kidney disease at 25 Maternal grandmother:no history of cancer  Patient's maternal ancestors are of Caucasian descent, and paternal ancestors are of Caucasian descent. There is no reported Ashkenazi Jewish ancestry. There is no known consanguinity.  GENETIC COUNSELING ASSESSMENT: SAMER DUTTON is a 57 y.o. male with a family history of a pathogenic NBN variant.   NBN gene:  The normal function of the NBN gene is to help protect our bodies from cancer; the NBN works with other genes in a complex and is important in the repair of DNA that becomes damaged during normal DNA replication. We all inherit two copies of the NBN gene, one from each parent. If there is a specific change (mutation) in the NBN gene that stops the gene from working properly, this may inhibit the cell's ability to correct mistakes in the DNA, thereby permitting more mutations to accumulate, which can increase individual's risk to develop cancer in their lifetime.   Peer-reviewed research suggests that women who have inherited a mutation in the NBN gene have a moderately increased lifetime risk of developing breast cancer over that of a woman in the general population.  Current studies suggest there is an approximately 2.7 fold increased risk.  (most of the data on this gene comes from studies of the slavic founder mutation [phone], and it is unknown if other variants confer  the same risks). The risk for a 2nd primary breast cancer may be elevated, but is currently not well defined.   It has also been suggested that mutations in NBN may also be associated with prostate and other cancers, however these risks are not well defined.   Men who have inherited an NBN mutation may be at a slightly increased risk for prostate cancer, but data is not conclusive and there are no current recommendations for increased prostate cancer screening over  that of the general population.  It is currently unknown how this mutation affects male breast cancer risk.  Further research about the NBN gene is expected to be available in the coming years, so recommendations may evolve.   Recommendations for Cancer Screening:  Current national guidelines Naval architect (NCCN) guidelines) suggest the following screening for breast surveillance:   Annual mammogram and also consider annual breast MRI with contrast at age 61  The evidence is insufficient to recommend a risk reducing mastectomy, unless the risk for breast cancer based on family history warrants discussion.   Additionally, there is no evidence to suggest an increased risk for ovarian cancer.  We discussed the option to have genetic testing for this NBN pathogenic variant.  At this time there are not management recommendations for men (although we did discuss monthly self breast exams) with NBN therefore the results of this testing are unlikely to change his cancer screening plan.  However, some people find that knowing their mutation status can be helpful in managing their health and being aware of their risks. We discussed that the laboratory offers free family variant testing for the single variants previously found in his sister.  We discussed the difference between panel testing and family variant testing.  Mr. Highfill elected to proceed with family variant testing and understands that single site testing will not  analyze a a full panel of genes and there is still a possibility there are other genetic findings in the family aside from the known NBN mutation that we would not be testing for.   We discussed that some people do not want to undergo genetic testing due to fear of genetic discrimination.  A federal law called the Genetic Information Non-Discrimination Act (GINA) of 2008 helps protect individuals against genetic discrimination based on their genetic test results.  It impacts both health insurance and employment.  For health insurance, it protects against increased premiums, being kicked off insurance or being forced to take a test in order to be insured.  For employment it protects against hiring, firing and promoting decisions based on genetic test results.  Health status due to a cancer diagnosis is not protected under GINA.  This law does not protect life insurance, disability insurance, or other types of insurance.   PLAN: After considering the risks, benefits, and limitations, Mr. Lichty  provided informed consent to pursue genetic testing and the blood sample was sent to [facility] for analysis of the Family Variant testing (NBN pathogenic variant and BRCA1 VUS). Results should be available within approximately 2-3 weeks' time, at which point they will be disclosed by telephone to Mr. Zapien, as will any additional recommendations warranted by these results. Mr. Messler will receive a summary of his genetic counseling visit and a copy of his results once available. This information will also be available in Epic. We encouraged Mr. Helms to remain in contact with cancer genetics annually so that we can continuously update the family history and inform him of any changes in cancer genetics and testing that may be of benefit for his family. Mr. Rathe questions were answered to his satisfaction today. Our contact information was provided should additional questions or concerns arise.  Based on  Mr. Cambre family history, we recommended all of his relatives also have genetic testing.  Mr. Collister will let us know if we can be of any assistance in coordinating genetic counseling and/or testing for this family member.   Lastly, we encouraged Mr. Smoak  to remain in contact with cancer genetics annually so that we can continuously update the family history and inform him of any changes in cancer genetics and testing that may be of benefit for this family.   Mr.  Murguia questions were answered to his satisfaction today. Our contact information was provided should additional questions or concerns arise. Thank you for the referral and allowing [place] to share in the care of your patient.   Tana Felts, MS, [facility] Certified Genetic Counselor [email or web address] phone: [phone]  The patient was seen for a total of 20 minutes in face-to-face genetic counseling.  The patient was accompanied today by his sister Baron Sane, wife, and brother in-law. This patient was discussed with Drs. Magrinat, Lindi Adie and/or Burr Medico who agrees with the above.

## 2018-09-15 ENCOUNTER — Encounter: Payer: Self-pay | Admitting: Genetics

## 2018-09-15 ENCOUNTER — Telehealth: Payer: Self-pay | Admitting: Genetics

## 2018-09-15 ENCOUNTER — Ambulatory Visit: Payer: Self-pay | Admitting: Genetics

## 2018-09-15 DIAGNOSIS — Z1379 Encounter for other screening for genetic and chromosomal anomalies: Secondary | ICD-10-CM | POA: Insufficient documentation

## 2018-09-15 DIAGNOSIS — G4733 Obstructive sleep apnea (adult) (pediatric): Secondary | ICD-10-CM | POA: Diagnosis not present

## 2018-09-15 NOTE — Progress Notes (Signed)
HPI:  [name] was previously seen in the [facility] on 09/02/2018 due to a family history of an NBN pathogenic variant being identified in his sister. . Please refer to our prior cancer genetics clinic note for more information regarding [name] medical, social and family histories, and our assessment and recommendations, at the time. [name] recent genetic test results were disclosed to him, as well as recommendations warranted by these results. These results and recommendations are discussed in more detail below.  CANCER HISTORY:   No history exists.    FAMILY HISTORY:  We obtained a detailed, 4-generation family history.  Significant diagnoses are listed below: Family History  Problem Relation Age of Onset  . Hypertension Mother   . Heart attack Mother        deceased  . Heart disease Mother   . Kidney disease Mother   . Heart attack Father 8       deceased  . Heart disease Father   . Breast cancer Sister 5       NBN +  . Lung cancer Brother   . Kidney disease Maternal Grandfather   . Colon cancer Neg Hx   . Stomach cancer Neg Hx   . Esophageal cancer Neg Hx   . Rectal cancer Neg Hx     [name] has no children.  He has a sister who was recently diagnosed with triple negative breast cancer at 86.  She underwent genetic testing that revealed a NBN pathogenic variant c.2117C>G (p.Ser706*).  Her testing also revealed a variant of uncertain significance in the gene BRCA1 c.77T>C (p.Ile26Thr). This sister has 2 children. [name] also has a paternal half-brother who had lung cancer and has 1 daughter.   [name] father: died at 62 Paternal Aunts/Uncles: 1 paternal aunt died in her 44's- no history of cancer Paternal cousins: no history of cancer Paternal grandfather: unk Paternal grandmother:died in her 50's/90's cause unk  [name] mother: died at 66 of kidney disease- she had a hysterectomy Maternal Aunts/Uncles: 6 maternal  aunts/uncles with no history of cancer. Maternal cousins: 1 maternal cousin died of pancreatic cancer >50 Maternal grandfather: died of kidney disease at 61 Maternal grandmother:no history of cancer  Patient's maternal ancestors are of Caucasian descent, and paternal ancestors are of Caucasian descent. There is no reported Ashkenazi Jewish ancestry. There is no known consanguinity.  GENETIC TEST RESULTS: Genetic testing was performed by [facility].  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   The genetic testing showed no pathogenic mutations.  The test report will be scanned into EPIC and will be located under the Molecular Pathology section of the Results Review tab. A portion of the result report is included below for reference.     ADDITIONAL GENETIC TESTING: [name] had family variant testing only.  We did not analyze any other cancer risk genes.  We discussed with [name] that there are other genes that are associated with increased cancer risk that can be analyzed. The laboratories that offer this testing look at these additional genes via a hereditary cancer gene panel. Should [name] wish to pursue additional genetic testing, we are happy to discuss and coordinate this testing, at any time.    CANCER SCREENING RECOMMENDATIONS: [name] does not carry the familial NBN pathogenic variant or the  familial BRCA1 VUS.   While his results are negative for the familial variants, a full panel of cancer genes was not analyzed.  This does not definitively rule out a hereditary predisposition to cancer in [name]. It is still possible that there could be genetic mutations in genes not tested or that are undetectable by current technology/knowledge.  Therefore, it is recommended he continue to follow  the cancer management and screening guidelines provided by his oncology and primary healthcare provider. An individual's cancer risk is not determined by genetic test results alone.  Overall cancer risk assessment includes additional factors such as personal medical history, family history, etc.  These should be used to make a personalized plan for cancer prevention and surveillance.    RECOMMENDATIONS FOR FAMILY MEMBERS:  Relatives should also have genetic testing for the familial NBN mutation as well as due to the family history of pancreatic cancer.  (at this time we do not know if the NBN mutation is maternally or paternally inherited, therefore relatives on both sides of the family are recommended to test)   [name] will let us know if we can be of any assistance in coordinating genetic counseling and/or testing for these family members.   FOLLOW-UP: Lastly, we discussed with [name] that cancer genetics is a rapidly advancing field and it is possible that new genetic tests will be appropriate for him and/or his family members in the future. We encouraged him to remain in contact with cancer genetics on an annual basis so we can update his personal and family histories and let him know of advances in cancer genetics that may benefit this family.   Our contact number was provided. [name] questions were answered to his satisfaction, and he knows he is welcome to call us at anytime with additional questions or concerns.   Charles Luz, MS, [facility] Certified Genetic Counselor [email or web address]

## 2018-09-15 NOTE — Telephone Encounter (Signed)
Left message for patient informing about negative genetic test results.  He does not carry his sister's NBN mutation.  Asked patient to call back to discuss further and if any questions.   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

## 2018-10-20 ENCOUNTER — Encounter: Payer: Self-pay | Admitting: Family Medicine

## 2018-10-20 MED ORDER — ALPRAZOLAM 0.5 MG PO TABS: 0.5000 mg | ORAL_TABLET | Freq: Every day | ORAL | 0 refills | Status: DC | PRN

## 2018-10-20 NOTE — Telephone Encounter (Signed)
Also received fax refill request for xanax, pharmacy said last filled in July 2018, CPE was done on 08/26/2018

## 2018-10-20 NOTE — Telephone Encounter (Signed)
Sent mychart message letting pt know Dr. Tower's comments  

## 2018-10-20 NOTE — Telephone Encounter (Signed)
Please tell him to use with caution of sedation (not while working or driving) Short term use only  Can be habit forming  Hope it helps

## 2018-12-03 DIAGNOSIS — Z23 Encounter for immunization: Secondary | ICD-10-CM | POA: Diagnosis not present

## 2018-12-23 DIAGNOSIS — G4733 Obstructive sleep apnea (adult) (pediatric): Secondary | ICD-10-CM | POA: Diagnosis not present

## 2019-02-19 DIAGNOSIS — Z23 Encounter for immunization: Secondary | ICD-10-CM | POA: Diagnosis not present

## 2019-04-22 DIAGNOSIS — G4733 Obstructive sleep apnea (adult) (pediatric): Secondary | ICD-10-CM | POA: Diagnosis not present

## 2019-07-21 ENCOUNTER — Telehealth: Payer: Self-pay

## 2019-07-21 NOTE — Telephone Encounter (Signed)
Received a request from Mychart to schedule f/u apt with Dr. Mortimer Fries, [facility] for patient to call back to schedule.

## 2019-08-12 ENCOUNTER — Ambulatory Visit (INDEPENDENT_AMBULATORY_CARE_PROVIDER_SITE_OTHER): Payer: 59 | Admitting: Internal Medicine

## 2019-08-12 ENCOUNTER — Encounter: Payer: Self-pay | Admitting: Internal Medicine

## 2019-08-12 ENCOUNTER — Other Ambulatory Visit: Payer: Self-pay

## 2019-08-12 DIAGNOSIS — G4733 Obstructive sleep apnea (adult) (pediatric): Secondary | ICD-10-CM | POA: Diagnosis not present

## 2019-08-12 NOTE — Progress Notes (Signed)
Name: JARREN PARA MRN: [id number] DOB: Aug 29, 1961     I connected with the patient by video/telephone enabled telemedicine visit and verified that I am speaking with the correct person using two identifiers.    I discussed the limitations, risks, security and privacy concerns of performing an evaluation and management service by telemedicine and the availability of in-person appointments. I also discussed with the patient that there may be a patient responsible charge related to this service. The patient expressed understanding and agreed to proceed.  PATIENT AGREES AND CONFIRMS -YES   Other persons participating in the visit and their role in the encounter: Patient, nursing   Patient's location: Home Provider's location: Clinic   I discussed the limitations, risks, security and privacy concerns of performing an evaluation and management service by telephone and the availability of in person appointments. I also discussed with the patient that there may be a patient responsible charge related to this service. The patient expressed understanding and agreed to proceed.  This visit type was conducted due to national recommendations for restrictions regarding the COVID-19 Pandemic (e.g. social distancing).  This format is felt to be most appropriate for this patient at this time.  All issues noted in this document were discussed and addressed.        CONSULTATION DATE: 07/30/17 REFERRING MD : Glori Bickers   CHIEF COMPLAINT: follow up OSA  STUDIES:  Chest x-ray 07/16/2007 images reviewed 07/30/2017 Interpretation no acute findings no focal opacities no pneumonia   Previous HISTORY OF PRESENT ILLNESS: 58 year old pleasant white male seen today for a diagnosis of obstructive sleep apnea Patient was diagnosed with sleep apnea at [facility] in 2002 Patient has been using and benefiting from CPAP therapy at 10 cm of water pressure Patient uses a nasal mask but sometimes he says his mouth is  open during his usage If patient is not using his CPAP he feels very fatigued has extreme excessive daytime sleepiness and very lethargic Patient weighs 360 pounds 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, 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 Patient does not smoke or does not drink alcohol   HPI  Doing well with CPAP Switched masks -previous mask was full face mask Lots of leaks  Now using   +GERD controlled with meds No symptoms of CHF No SOB and DOE   No evidence of infection No COVID symptoms  Patient using and benefiting from CPAP daily  Compliance report 100% compliance Days and >4 hrs AUTOCPAP 12-18 cm h20 AHI down to 4.4   PAST MEDICAL HISTORY :   has a past medical history of Allergy, Arthritis, Esophageal reflux, Family history of breast cancer, Family history of genetic disease carrier, Family history of pancreatic cancer, Fatty liver, HLD (hyperlipidemia), HTN (hypertension), Morbid obesity ([place]), Sleep apnea, Tobacco use disorder, and Unspecified sleep apnea.  has a past surgical history that includes Knee arthroscopy; Colonoscopy (2013); and Colonoscopy with propofol (N/A, 05/20/2018). Prior to Admission medications   Medication Sig Start Date End Date Taking? Authorizing Provider  ALPRAZolam Duanne Moron) 0.5 MG tablet TAKE 1 TABLET BY MOUTH EVERY DAY AS NEEDED FOR ANXIETY 02/26/17  Yes Tower, Wynelle Fanny, MD  aspirin 81 MG tablet Take 81 mg by mouth daily.     Yes [provider]  cetirizine (ZYRTEC) 10 MG tablet Take 10 mg by mouth daily.   Yes [provider]  Cinnamon 500 MG capsule Take 500 mg by mouth daily.     Yes [provider]  Coenzyme Q10 (CO Q 10) 100 MG CAPS Take 1 capsule by mouth daily.     Yes [provider]  Cyanocobalamin (B-12 PO) Take 1 capsule by mouth daily.   Yes [provider]  fish oil-omega-3 fatty acids 1000 MG capsule Take 2 g by mouth 2 (two) times daily.     Yes [provider]   glucosamine-chondroitin 500-400 MG tablet Take 1 tablet by mouth daily.     Yes [provider]  lisinopril-hydrochlorothiazide (PRINZIDE,ZESTORETIC) 20-25 MG tablet Take 1 tablet by mouth daily. 05/20/17  Yes Tower, Wynelle Fanny, MD  NON FORMULARY CPAP at night as directed    Yes [provider]  omeprazole (PRILOSEC) 40 MG capsule TAKE 1 CAPSULE (40 MG TOTAL) BY MOUTH DAILY. 05/20/17  Yes Tower, Wynelle Fanny, MD  pyridoxine (B-6) 200 MG tablet Take 200 mg by mouth daily.     Yes [provider]  Red Yeast Rice 600 MG CAPS Take 1 capsule by mouth daily.     Yes [provider]  Turmeric 500 MG CAPS Take 2 capsules by mouth daily.   Yes [provider]   No Known Allergies  FAMILY HISTORY:  family history includes Breast cancer (age of onset: 41) in his sister; Heart attack in his mother; Heart attack (age of onset: 61) in his father; Heart disease in his father and mother; Hypertension in his mother; Kidney disease in his maternal grandfather and mother; Lung cancer in his brother. SOCIAL HISTORY:  reports that he quit smoking about 36 years ago. His smokeless tobacco use includes snuff. He reports current alcohol use. He reports that he does not use drugs.   Review of Systems:  Gen:  Denies  fever, sweats, chills weight loss  HEENT: Denies blurred vision, double vision, ear pain, eye pain, hearing loss, nose bleeds, sore throat Cardiac:  No dizziness, chest pain or heaviness, chest tightness,edema, No JVD Resp:   No cough, -sputum production, -shortness of breath,-wheezing, -hemoptysis,  Gi: Denies swallowing difficulty, stomach pain, nausea or vomiting, diarrhea, constipation, bowel incontinence Gu:  Denies bladder incontinence, burning urine Ext:   Denies Joint pain, stiffness or swelling Skin: Denies  skin rash, easy bruising or bleeding or hives Endoc:  Denies polyuria, polydipsia , polyphagia or weight change Psych:   Denies depression, insomnia or  hallucinations  Other:  All other systems negative   ASSESSMENT / PLAN: 58 yo WM with underlying OSA and morbid obesity doing well with CPAP therapy, patient using and benefiting from therapy No fatigue or excessive daytime sleepiness AHI down to 4.4  OSA Continue CPAP as prescribed  Obesity -recommend significant weight loss -recommend changing diet Consider gastric  bypass assessment  Deconditioned state -Recommend increased daily activity and exercise   COVID-19 EDUCATION: The signs and symptoms of COVID-19 were discussed with the patient and how to seek care for testing.  The importance of social distancing was discussed today. Hand Washing Techniques and avoid touching face was advised.  MEDICATION ADJUSTMENTS/LABS AND TESTS ORDERED: Continue CPAP as prescribed   Patient satisfied with Plan of action and management. All questions answered  Follow up in 1 year  Total time spent 24 mins  Tailer Volkert Patricia Pesa, M.D.  [facility]  Medical Director [place] Director [facility] Cardio-Pulmonary Department

## 2019-08-12 NOTE — Patient Instructions (Signed)
Continue CPAP as prescibed  Recommend Weight loss

## 2019-09-15 ENCOUNTER — Other Ambulatory Visit: Payer: Self-pay | Admitting: *Deleted

## 2019-09-15 NOTE — Telephone Encounter (Signed)
Please schedule PE and refill until then  

## 2019-09-15 NOTE — Telephone Encounter (Signed)
No recent or future appts., please advise  

## 2019-09-16 MED ORDER — OMEPRAZOLE 40 MG PO CPDR: DELAYED_RELEASE_CAPSULE | ORAL | 0 refills | Status: DC

## 2019-09-16 MED ORDER — LISINOPRIL-HYDROCHLOROTHIAZIDE 20-25 MG PO TABS: 1.0000 | ORAL_TABLET | Freq: Every day | ORAL | 0 refills | Status: DC

## 2019-09-16 NOTE — Telephone Encounter (Signed)
Med refilled once and Charles Morales will reach out to pt to try and get appt scheduled  

## 2019-09-16 NOTE — Telephone Encounter (Signed)
I left a detailed message on patient's voice mail to let him know medications were refilled and to call back to schedule cpx, when able.

## 2019-10-10 ENCOUNTER — Other Ambulatory Visit: Payer: Self-pay | Admitting: Family Medicine

## 2019-10-22 ENCOUNTER — Telehealth: Payer: Self-pay | Admitting: Family Medicine

## 2019-10-22 DIAGNOSIS — Z0000 Encounter for general adult medical examination without abnormal findings: Secondary | ICD-10-CM

## 2019-10-22 DIAGNOSIS — R7303 Prediabetes: Secondary | ICD-10-CM

## 2019-10-22 DIAGNOSIS — I10 Essential (primary) hypertension: Secondary | ICD-10-CM

## 2019-10-22 DIAGNOSIS — Z125 Encounter for screening for malignant neoplasm of prostate: Secondary | ICD-10-CM

## 2019-10-22 DIAGNOSIS — E7800 Pure hypercholesterolemia, unspecified: Secondary | ICD-10-CM

## 2019-10-22 NOTE — Telephone Encounter (Signed)
-----   Message from Ellamae Sia sent at 10/13/2019  3:15 PM EDT ----- Regarding: Lab orders for Friday, 10.23.20 Patient is scheduled for CPX labs, please order future labs, Thanks , Karna Christmas

## 2019-10-23 ENCOUNTER — Other Ambulatory Visit (INDEPENDENT_AMBULATORY_CARE_PROVIDER_SITE_OTHER): Payer: 59

## 2019-10-23 ENCOUNTER — Telehealth: Payer: Self-pay

## 2019-10-23 DIAGNOSIS — R7303 Prediabetes: Secondary | ICD-10-CM

## 2019-10-23 DIAGNOSIS — I10 Essential (primary) hypertension: Secondary | ICD-10-CM | POA: Diagnosis not present

## 2019-10-23 DIAGNOSIS — E7800 Pure hypercholesterolemia, unspecified: Secondary | ICD-10-CM | POA: Diagnosis not present

## 2019-10-23 DIAGNOSIS — Z125 Encounter for screening for malignant neoplasm of prostate: Secondary | ICD-10-CM

## 2019-10-23 LAB — COMPREHENSIVE METABOLIC PANEL
ALT: 34 U/L (ref 0–53)
AST: 23 U/L (ref 0–37)
Albumin: 4.1 g/dL (ref 3.5–5.2)
Alkaline Phosphatase: 50 U/L (ref 39–117)
BUN: 17 mg/dL (ref 6–23)
CO2: 29 mEq/L (ref 19–32)
Calcium: 9.3 mg/dL (ref 8.4–10.5)
Chloride: 104 mEq/L (ref 96–112)
Creatinine, Ser: 0.99 mg/dL (ref 0.40–1.50)
GFR: 77.87 mL/min (ref >60.00)
Glucose, Bld: 99 mg/dL (ref 70–99)
Potassium: 4.2 mEq/L (ref 3.5–5.1)
Sodium: 140 mEq/L (ref 135–145)
Total Bilirubin: 1 mg/dL (ref 0.2–1.2)
Total Protein: 6.8 g/dL (ref 6.0–8.3)

## 2019-10-23 LAB — LIPID PANEL
Cholesterol: 155 mg/dL (ref 0–200)
HDL: 32.7 mg/dL — ABNORMAL LOW (ref >39.00)
LDL Cholesterol: 104 mg/dL — ABNORMAL HIGH (ref 0–99)
NonHDL: 122.08
Total CHOL/HDL Ratio: 5
Triglycerides: 89 mg/dL (ref 0.0–149.0)
VLDL: 17.8 mg/dL (ref 0.0–40.0)

## 2019-10-23 LAB — CBC WITH DIFFERENTIAL/PLATELET
Basophils Absolute: 0 10*3/uL (ref 0.0–0.1)
Basophils Relative: 0.6 % (ref 0.0–3.0)
Eosinophils Absolute: 0.1 10*3/uL (ref 0.0–0.7)
Eosinophils Relative: 2.1 % (ref 0.0–5.0)
HCT: 44.5 % (ref 39.0–52.0)
Hemoglobin: 14.8 g/dL (ref 13.0–17.0)
Lymphocytes Relative: 30 % (ref 12.0–46.0)
Lymphs Abs: 1.9 10*3/uL (ref 0.7–4.0)
MCHC: 33.2 g/dL (ref 30.0–36.0)
MCV: 88.3 fl (ref 78.0–100.0)
Monocytes Absolute: 0.6 10*3/uL (ref 0.1–1.0)
Monocytes Relative: 9.6 % (ref 3.0–12.0)
Neutro Abs: 3.7 10*3/uL (ref 1.4–7.7)
Neutrophils Relative %: 57.7 % (ref 43.0–77.0)
Platelets: 177 10*3/uL (ref 150.0–400.0)
RBC: 5.04 Mil/uL (ref 4.22–5.81)
RDW: 14.9 % (ref 11.5–15.5)
WBC: 6.5 10*3/uL (ref 4.0–10.5)

## 2019-10-23 LAB — PSA: PSA: 1.36 ng/mL (ref 0.10–4.00)

## 2019-10-23 LAB — TSH: TSH: 1.14 u[IU]/mL (ref 0.35–4.50)

## 2019-10-23 LAB — HEMOGLOBIN A1C: Hgb A1c MFr Bld: 5.8 % (ref 4.6–6.5)

## 2019-10-23 NOTE — Telephone Encounter (Signed)
Per lab tab pt has already had labs drawn.

## 2019-10-23 NOTE — Telephone Encounter (Signed)
[facility] Night - Client Nonclinical Telephone Record AccessNurse Client [facility] Night - Client Client Site [place] - Night Contact Type Call Who Is Calling Patient / Member / Family / Caregiver Caller Name Jeffory Shafiq Caller Phone Number [phone] Patient Name Charles Morales Patient DOB September 14, 1961 Call Type Message Only Information Provided Reason for Call Request for General Office Information Initial Comment Caller states he needs the office to call him, he's there for his 8:00 AM appointment, needs to know where to go. Additional Comment Call Closed By: Baruch Goldmann Transaction Date/Time: 10/23/2019 7:48:25 AM (ET)

## 2019-10-30 ENCOUNTER — Encounter: Payer: 59 | Admitting: Family Medicine

## 2019-11-06 ENCOUNTER — Other Ambulatory Visit: Payer: Self-pay | Admitting: Family Medicine

## 2019-11-13 ENCOUNTER — Encounter: Payer: Self-pay | Admitting: Family Medicine

## 2019-11-13 ENCOUNTER — Ambulatory Visit (INDEPENDENT_AMBULATORY_CARE_PROVIDER_SITE_OTHER): Payer: 59 | Admitting: Family Medicine

## 2019-11-13 ENCOUNTER — Other Ambulatory Visit: Payer: Self-pay

## 2019-11-13 VITALS — BP 108/70 | HR 65 | Temp 97.7°F | Ht 69.5 in | Wt 374.0 lb

## 2019-11-13 DIAGNOSIS — E7800 Pure hypercholesterolemia, unspecified: Secondary | ICD-10-CM | POA: Diagnosis not present

## 2019-11-13 DIAGNOSIS — Z23 Encounter for immunization: Secondary | ICD-10-CM | POA: Diagnosis not present

## 2019-11-13 DIAGNOSIS — R7303 Prediabetes: Secondary | ICD-10-CM

## 2019-11-13 DIAGNOSIS — Z125 Encounter for screening for malignant neoplasm of prostate: Secondary | ICD-10-CM

## 2019-11-13 DIAGNOSIS — Z0000 Encounter for general adult medical examination without abnormal findings: Secondary | ICD-10-CM

## 2019-11-13 DIAGNOSIS — F17200 Nicotine dependence, unspecified, uncomplicated: Secondary | ICD-10-CM

## 2019-11-13 DIAGNOSIS — I10 Essential (primary) hypertension: Secondary | ICD-10-CM | POA: Diagnosis not present

## 2019-11-13 MED ORDER — OMEPRAZOLE 40 MG PO CPDR: DELAYED_RELEASE_CAPSULE | ORAL | 3 refills | Status: DC

## 2019-11-13 MED ORDER — LISINOPRIL-HYDROCHLOROTHIAZIDE 20-25 MG PO TABS: 1.0000 | ORAL_TABLET | Freq: Every day | ORAL | 3 refills | Status: DC

## 2019-11-13 NOTE — Assessment & Plan Note (Signed)
Disc goals for lipids and reasons to control them Rev last labs with pt Rev low sat fat diet in detail LDL is now above 100 (usually around 80)  Strong fam h/o vasc dz  Enc strongly better diet-he plans on it  Low threshold for statin if needed

## 2019-11-13 NOTE — Assessment & Plan Note (Addendum)
Reviewed health habits including diet and exercise and skin cancer prevention Reviewed appropriate screening tests for age  Also reviewed health mt list, fam hx and immunization status , as well as social and family history   See HPI Labs rev Flu shot today  Had shingrix vaccines  Long disc re: need for wt loss  Also exercise-upper body as able  Would benefit from better self care

## 2019-11-13 NOTE — Assessment & Plan Note (Signed)
Worse with bmi of 54.4  Pt blames convenience eating and care of wife with cancer/long work hours and stressors  Plans on consult for bariatric surgery Suggested in meantime return to myfitnesspal (lost 70 lb with it in the past)  Or wt watchers on line  Disc plan for chair exercise  Pt understands risks of this wt  Also making knee arthritis much worse

## 2019-11-13 NOTE — Assessment & Plan Note (Signed)
Lab Results  Component Value Date   PSA 1.36 10/23/2019   PSA 1.67 08/18/2018    No fam hx No symptoms

## 2019-11-13 NOTE — Assessment & Plan Note (Signed)
Snuff use Counseled on cessation  He sees dentist regularly

## 2019-11-13 NOTE — Assessment & Plan Note (Signed)
Lab Results  Component Value Date   HGBA1C 5.8 10/23/2019   Stable disc imp of low glycemic diet and wt loss to prevent DM2

## 2019-11-13 NOTE — Progress Notes (Signed)
Subjective:    Patient ID: Charles Morales, male    DOB: 11/29/1961, 58 y.o.   MRN: [id number]  HPI Here for health maintenance exam and to review chronic medical problems   Has been doing well overall  Working a lot -going ok   Sister had breast cancer - did well with tx  Wife had gyn cancer and was treated - cancer came back (being tx again) -but prognosis is pretty good  Tough year overall He stays positive about it overall   He has xanax to take for emergencies -keeps it on hand   Lost 70 lb in the past with myfitness pal  Some walking also  Then got off track    Wt Readings from Last 3 Encounters:  11/13/19 (!) 374 lb (169.6 kg)  08/26/18 (!) 355 lb 12.8 oz (161.4 kg)  08/15/18 (!) 356 lb (161.5 kg)  needs to take better care of himself - has not been able  Trying to eat right  No sugar drinks  54.44 kg/m   Gained significant weight  Knees are really bothering him- went back to orthopedics and had some injection (synvisc)  It did not work as well as last time   Was interested in bariatric surgery   (in fact wife had it in the past so he knows a bit about it)  Has not done a consult yet- has to get his wife through her treatment  He plans on doing this at the first of the year   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  He has to eat a different times than others   Does avoid fast food  Eating too much chicken that is fried  Cannot be very active due to knee pain   He is interested in dietician or the healthy [facility] wellness clinic   Takes tylenol  meloxicam  (avoids with other nsaids)    Had shingrix vaccines   Flu vaccine-given today   Tdap 5/13  Colonoscopy 5/19 with 5 yr recall   bp is stable today  No cp or palpitations or headaches or edema  No side effects to medicines  BP Readings from Last 3 Encounters:  11/13/19 108/70  08/26/18 125/82  08/15/18 110/70     Pulse Readings from Last 3 Encounters:  11/13/19 65  08/26/18 73   08/15/18 63     Much cancer in family  Pt had genetic testing = neg for NBN variant that his sister had (recent breast cancer)    Hyperlipidemia  Lab Results  Component Value Date   CHOL 155 10/23/2019   CHOL 132 08/18/2018   CHOL 136 05/06/2017   Lab Results  Component Value Date   HDL 32.70 (L) 10/23/2019   HDL 33.30 (L) 08/18/2018   HDL 35.60 (L) 05/06/2017   Lab Results  Component Value Date   LDLCALC 104 (H) 10/23/2019   [place] 80 08/18/2018   LDLCALC 88 05/06/2017   Lab Results  Component Value Date   TRIG 89.0 10/23/2019   TRIG 95.0 08/18/2018   TRIG 58.0 05/06/2017   Lab Results  Component Value Date   CHOLHDL 5 10/23/2019   CHOLHDL 4 08/18/2018   CHOLHDL 4 05/06/2017   No results found for: LDLDIRECT  Fried chicken products  Red meat- twice per week   Prediabetes Lab Results  Component Value Date   HGBA1C 5.8 10/23/2019  up from 5.7  Prostate cancer tx Lab Results  Component Value Date  PSA 1.36 10/23/2019   PSA 1.67 08/18/2018  no family h/o prostate cancer  No personal symptoms   Lab Results  Component Value Date   WBC 6.5 10/23/2019   HGB 14.8 10/23/2019   HCT 44.5 10/23/2019   MCV 88.3 10/23/2019   PLT 177.0 10/23/2019   Lab Results  Component Value Date   TSH 1.14 10/23/2019    Lab Results  Component Value Date   CREATININE 0.99 10/23/2019   BUN 17 10/23/2019   NA 140 10/23/2019   K 4.2 10/23/2019   CL 104 10/23/2019   CO2 29 10/23/2019   Lab Results  Component Value Date   ALT 34 10/23/2019   AST 23 10/23/2019   ALKPHOS 50 10/23/2019   BILITOT 1.0 10/23/2019      Patient Active Problem List   Diagnosis Date Noted  . Genetic testing 09/15/2018  . Family history of genetic disease carrier 09/02/2018  . Family history of breast cancer   . Family history of pancreatic cancer   . Prostate cancer screening 08/17/2018  . History of colonic polyps   . Prediabetes 04/01/2016  . Left ankle pain 10/14/2015  . PVC's  (premature ventricular contractions) 10/22/2013  . Colon cancer screening 05/23/2012  . Routine general medical examination at a health care facility 05/20/2012  . Knee pain, right 06/08/2011  . G E R D 08/04/2007  . Hyperlipidemia 07/22/2007  . Morbid obesity ([place]) 07/22/2007  . SMOKELESS TOBACCO ABUSE 07/22/2007  . Essential hypertension 07/22/2007  . Sleep apnea 07/22/2007   Past Medical History:  Diagnosis Date  . Allergy   . Arthritis    right knee  . Esophageal reflux   . Family history of breast cancer   . Family history of genetic disease carrier    sister NBN pathogenic variant  . Family history of pancreatic cancer   . Fatty liver   . HLD (hyperlipidemia)   . HTN (hypertension)   . Morbid obesity ([place])   . Sleep apnea    USES CPAP   . Tobacco use disorder   . Unspecified sleep apnea    Past Surgical History:  Procedure Laterality Date  . COLONOSCOPY  2013  . COLONOSCOPY WITH PROPOFOL N/A 05/20/2018   Procedure: COLONOSCOPY WITH PROPOFOL;  Surgeon: Jerene Bears, MD;  Location: [facility];  Service: Gastroenterology;  Laterality: N/A;  . KNEE ARTHROSCOPY     left   Social History   Tobacco Use  . Smoking status: Former Smoker    Quit date: 12/31/1982    Years since quitting: 36.8  . Smokeless tobacco: Current User    Types: Snuff  Substance Use Topics  . Alcohol use: Yes    Alcohol/week: 0.0 standard drinks    Comment: OCC BEER  . Drug use: No   Family History  Problem Relation Age of Onset  . Hypertension Mother   . Heart attack Mother        deceased  . Heart disease Mother   . Kidney disease Mother   . Heart attack Father 26       deceased  . Heart disease Father   . Breast cancer Sister 61       NBN +  . Lung cancer Brother   . Kidney disease Maternal Grandfather   . Colon cancer Neg Hx   . Stomach cancer Neg Hx   . Esophageal cancer Neg Hx   . Rectal cancer Neg Hx    No Known Allergies Current Outpatient  Medications on File Prior to  Visit  Medication Sig Dispense Refill  . Acetaminophen (TYLENOL ARTHRITIS PAIN PO) Take 650 mg by mouth 2 (two) times daily.    Marland Kitchen ALPRAZolam (XANAX) 0.5 MG tablet Take 1 tablet (0.5 mg total) by mouth daily as needed for anxiety. 30 tablet 0  . aspirin 325 MG tablet Take 325 mg by mouth daily.    . cetirizine (ZYRTEC) 10 MG tablet Take 10 mg by mouth daily.    . Cyanocobalamin (B-12 PO) Take 2 tablets by mouth daily. 2000 mcg daily    . FIBER PO Take 2 tablets by mouth daily.     Marland Kitchen GLUCOSAMINE-CHONDROITIN PO Take 2 tablets by mouth daily. 1500    . meloxicam (MOBIC) 15 MG tablet Take 15 mg by mouth daily.    . NON FORMULARY CPAP at night as directed     . Omega-3 Fatty Acids (FISH OIL) 1200 MG CPDR Take 2 capsules by mouth 2 (two) times daily.    Marland Kitchen pyridoxine (B-6) 100 MG tablet Take 100 mg by mouth daily.      No current facility-administered medications on file prior to visit.      Review of Systems  Constitutional: Negative for activity change, appetite change, fatigue, fever and unexpected weight change.  HENT: Negative for congestion, rhinorrhea, sore throat and trouble swallowing.   Eyes: Negative for pain, redness, itching and visual disturbance.  Respiratory: Negative for cough, chest tightness, shortness of breath and wheezing.   Cardiovascular: Negative for chest pain and palpitations.  Gastrointestinal: Negative for abdominal pain, blood in stool, constipation, diarrhea and nausea.  Endocrine: Negative for cold intolerance, heat intolerance, polydipsia and polyuria.  Genitourinary: Negative for difficulty urinating, dysuria, frequency and urgency.  Musculoskeletal: Positive for arthralgias. Negative for joint swelling and myalgias.  Skin: Negative for pallor and rash.  Neurological: Negative for dizziness, tremors, weakness, numbness and headaches.  Hematological: Negative for adenopathy. Does not bruise/bleed easily.  Psychiatric/Behavioral: Negative for decreased  concentration and dysphoric mood. The patient is not nervous/anxious.        Stressors       Objective:   Physical Exam Constitutional:      General: He is not in acute distress.    Appearance: Normal appearance. He is well-developed. He is obese. He is not ill-appearing or diaphoretic.  HENT:     Head: Normocephalic and atraumatic.     Right Ear: Tympanic membrane, ear canal and external ear normal.     Left Ear: Tympanic membrane, ear canal and external ear normal.     Nose: Nose normal. No congestion.     Mouth/Throat:     Mouth: Mucous membranes are moist.     Pharynx: Oropharynx is clear. No posterior oropharyngeal erythema.  Eyes:     General: No scleral icterus.       Right eye: No discharge.        Left eye: No discharge.     Conjunctiva/sclera: Conjunctivae normal.     Pupils: Pupils are equal, round, and reactive to light.  Neck:     Musculoskeletal: Normal range of motion and neck supple. No neck rigidity or muscular tenderness.     Thyroid: No thyromegaly.     Vascular: No carotid bruit or JVD.  Cardiovascular:     Rate and Rhythm: Normal rate and regular rhythm.     Pulses: Normal pulses.     Heart sounds: Normal heart sounds. No gallop.   Pulmonary:  Effort: Pulmonary effort is normal. No respiratory distress.     Breath sounds: Normal breath sounds. No wheezing or rales.     Comments: Good air exch Chest:     Chest wall: No tenderness.  Abdominal:     General: Bowel sounds are normal. There is no distension or abdominal bruit.     Palpations: Abdomen is soft. There is no mass.     Tenderness: There is no abdominal tenderness.     Hernia: No hernia is present.  Musculoskeletal:        General: No tenderness.     Right lower leg: No edema.     Left lower leg: No edema.     Comments: Limited rom knees  Gait is labored due to this and morbid obesity  Lymphadenopathy:     Cervical: No cervical adenopathy.  Skin:    General: Skin is warm and dry.      Coloration: Skin is not pale.     Findings: No erythema or rash.     Comments: Fair  Some areas of ruddy complexion Skin tags on neck  Neurological:     Mental Status: He is alert.     Cranial Nerves: No cranial nerve deficit.     Motor: No abnormal muscle tone.     Coordination: Coordination normal.     Gait: Gait normal.     Deep Tendon Reflexes: Reflexes are normal and symmetric. Reflexes normal.  Psychiatric:        Mood and Affect: Mood normal.        Cognition and Memory: Cognition and memory normal.           Assessment & Plan:   Problem List Items Addressed This Visit      Cardiovascular and Mediastinum   Essential hypertension    bp in fair control at this time  BP Readings from Last 1 Encounters:  11/13/19 108/70   No changes needed Most recent labs reviewed  Disc lifstyle change with low sodium diet and exercise        Relevant Medications   lisinopril-hydrochlorothiazide (ZESTORETIC) 20-25 MG tablet     Other   Hyperlipidemia    Disc goals for lipids and reasons to control them Rev last labs with pt Rev low sat fat diet in detail LDL is now above 100 (usually around 80)  Strong fam h/o vasc dz  Enc strongly better diet-he plans on it  Low threshold for statin if needed       Relevant Medications   lisinopril-hydrochlorothiazide (ZESTORETIC) 20-25 MG tablet   Morbid obesity ([facility])    Worse with bmi of 54.4  Pt blames convenience eating and care of wife with cancer/long work hours and stressors  Plans on consult for bariatric surgery Suggested in meantime return to myfitnesspal (lost 70 lb with it in the past)  Or wt watchers on line  Disc plan for chair exercise  Pt understands risks of this wt  Also making knee arthritis much worse      SMOKELESS TOBACCO ABUSE    Snuff use Counseled on cessation  He sees dentist regularly       Routine general medical examination at a health care facility - Primary    Reviewed health habits including  diet and exercise and skin cancer prevention Reviewed appropriate screening tests for age  Also reviewed health mt list, fam hx and immunization status , as well as social and family history   See HPI Labs rev  Flu shot today  Had shingrix vaccines  Long disc re: need for wt loss  Also exercise-upper body as able  Would benefit from better self care       Prediabetes    Lab Results  Component Value Date   HGBA1C 5.8 10/23/2019   Stable disc imp of low glycemic diet and wt loss to prevent DM2       Prostate cancer screening    Lab Results  Component Value Date   PSA 1.36 10/23/2019   PSA 1.67 08/18/2018    No fam hx No symptoms        Other Visit Diagnoses    Need for influenza vaccination       Relevant Orders   Flu Vaccine QUAD 6+ mos PF IM (Fluarix Quad PF) (Completed)

## 2019-11-13 NOTE — Patient Instructions (Addendum)
Look for chair exercise program -on line or dvd  Start doing it every day   For cholesterol Avoid red meat/ fried foods/ egg yolks/ fatty breakfast meats/ butter, cheese and high fat dairy/ and shellfish    Try to make effort to drink water   Consider going back to myfitness pal  Or weight watchers on line   Flu shot today

## 2019-11-13 NOTE — Assessment & Plan Note (Signed)
bp in fair control at this time  BP Readings from Last 1 Encounters:  11/13/19 108/70   No changes needed Most recent labs reviewed  Disc lifstyle change with low sodium diet and exercise

## 2020-02-21 ENCOUNTER — Other Ambulatory Visit: Payer: Self-pay | Admitting: Family Medicine

## 2020-02-24 ENCOUNTER — Telehealth: Payer: Self-pay | Admitting: *Deleted

## 2020-02-24 ENCOUNTER — Other Ambulatory Visit: Payer: Self-pay | Admitting: Family Medicine

## 2020-02-24 NOTE — Telephone Encounter (Signed)
Patient's wife called stating that she has been trying to get a refill on her husband's Lisinopril-HCTZ for the last 3 days. Sharyn Lull wanted to know why these were not refilled on 11/13/19 when he was in the office. Sharyn Lull stated that the pharmacy told her husband that he would need to contact the office. Advised Mrs. Hankes that the medication was refilled on that date 11/13/19 #90/3 and records show that the pharmacy received it. Patient's wife stated that the pharmacy told her that they did received the Omeprazole that date.  Vicenta Dunning they both were sent at the same time. Offered to call the pharmacy and advised them of this, but patient's wife stated that she will contact them and give them this information. Sharyn Lull stated that she will have the pharmacist call here if they have any questions.Marland Kitchen

## 2020-09-15 ENCOUNTER — Other Ambulatory Visit: Payer: Self-pay | Admitting: Family Medicine

## 2020-09-15 MED ORDER — ALPRAZOLAM 0.5 MG PO TABS: 0.5000 mg | ORAL_TABLET | Freq: Every day | ORAL | 0 refills | Status: DC | PRN

## 2020-09-15 NOTE — Telephone Encounter (Signed)
See prev note.   Name of Medication: Xanax Name of Pharmacy: [facility] [address] or Written Date and Quantity: 10/20/18 #30 with 0 refills (only given once almost 85yrs ago) Last Office Visit and Type: 11/13/19 Next Office Visit and Type:CPE 11/17/20

## 2020-09-15 NOTE — Telephone Encounter (Signed)
Pt schedule cpx 11/18 and needs refill on  Alprazolam  Pt is out of meds [facility] [address]

## 2020-11-17 ENCOUNTER — Encounter: Payer: Self-pay | Admitting: Family Medicine

## 2020-11-17 ENCOUNTER — Other Ambulatory Visit: Payer: Self-pay

## 2020-11-17 ENCOUNTER — Ambulatory Visit (INDEPENDENT_AMBULATORY_CARE_PROVIDER_SITE_OTHER): Payer: 59 | Admitting: Family Medicine

## 2020-11-17 VITALS — BP 124/78 | HR 68 | Temp 97.2°F | Ht 69.5 in | Wt 361.4 lb

## 2020-11-17 DIAGNOSIS — E7800 Pure hypercholesterolemia, unspecified: Secondary | ICD-10-CM

## 2020-11-17 DIAGNOSIS — I10 Essential (primary) hypertension: Secondary | ICD-10-CM

## 2020-11-17 DIAGNOSIS — Z125 Encounter for screening for malignant neoplasm of prostate: Secondary | ICD-10-CM

## 2020-11-17 DIAGNOSIS — R7303 Prediabetes: Secondary | ICD-10-CM

## 2020-11-17 DIAGNOSIS — Z0000 Encounter for general adult medical examination without abnormal findings: Secondary | ICD-10-CM

## 2020-11-17 DIAGNOSIS — Z23 Encounter for immunization: Secondary | ICD-10-CM | POA: Diagnosis not present

## 2020-11-17 LAB — POCT GLYCOSYLATED HEMOGLOBIN (HGB A1C): Hemoglobin A1C: 5.8 % — AB (ref 4.0–5.6)

## 2020-11-17 MED ORDER — LISINOPRIL-HYDROCHLOROTHIAZIDE 20-25 MG PO TABS: 1.0000 | ORAL_TABLET | Freq: Every day | ORAL | 3 refills | Status: DC

## 2020-11-17 MED ORDER — ROSUVASTATIN CALCIUM 5 MG PO TABS: 5.0000 mg | ORAL_TABLET | Freq: Every day | ORAL | 3 refills | Status: DC

## 2020-11-17 MED ORDER — OMEPRAZOLE 40 MG PO CPDR
DELAYED_RELEASE_CAPSULE | ORAL | 3 refills | Status: DC
Start: 2020-11-17 — End: 2021-11-20

## 2020-11-17 NOTE — Patient Instructions (Addendum)
Get your covid booster  COVID-19 Vaccine Information can be found at: ShippingScam.co.uk For questions related to vaccine distribution or appointments, please email [email or web address] or call [phone].    Start crestor 5 mg daily  If any problems/ side effects let us know  We will re check labs in about 6 weeks   Flu shot today   A1C today   Keep working on weight loss - great job so far

## 2020-11-17 NOTE — Assessment & Plan Note (Signed)
Reviewed health habits including diet and exercise and skin cancer prevention Reviewed appropriate screening tests for age  Also reviewed health mt list, fam hx and immunization status , as well as social and family history   Commended on wt loss so far with diet and exercise  Flu shot given  covid immunized and planning booster Had shingrix vaccine  Labs reviewed from work incl psa of 1.6  Plan to start statin for cholesterol  a1c drawn for prediabetes

## 2020-11-17 NOTE — Progress Notes (Signed)
Subjective:    Patient ID: Charles Morales, male    DOB: Oct 11, 1961, 59 y.o.   MRN: [id number]  This visit occurred during the SARS-CoV-2 public health emergency.  Safety protocols were in place, including screening questions prior to the visit, additional usage of staff PPE, and extensive cleaning of exam room while observing appropriate contact time as indicated for disinfecting solutions.    HPI Here for health maintenance exam and to review chronic medical problems    Wt Readings from Last 3 Encounters:  11/17/20 (!) 361 lb 7 oz (163.9 kg)  11/13/19 (!) 374 lb (169.6 kg)  08/26/18 (!) 355 lb 12.8 oz (161.4 kg)   52.61 kg/m   Has been feeling pretty good- proud of wt loss so far   (he got up to 380 lb in the spring)  Now working on it for the past 2 mo  Change in diet- less bread and sugar  Also eating smaller portions  Eating some healthy choice meals -have helped a lot  (aware of sodium)   Exercise -walking /exercising around the house  Renovating a bathroom - active as well  Knees are starting to feel better   Avoids sugar drinks  Drinks unsweet tea mostly / propel water  truvia in coffee   Flu shot- today  covid status- moderna vaccine march Is going to get a covid booster    Tdap 5/13 Zoster status -had shingrix   Colonoscopy 5/19   Prostate health Lab Results  Component Value Date   PSA 1.36 10/23/2019   PSA 1.67 08/18/2018  psa at work recent 1.6 No prostate cancer in the family  Nocturia - occasionally once (drinking a lot of water now)  No change in stream      HTN bp is stable today  No cp or palpitations or headaches or edema  No side effects to medicines  BP Readings from Last 3 Encounters:  11/17/20 124/78  11/13/19 108/70  08/26/18 125/82     Taking lisinopril hct 20-25 once daily Pulse Readings from Last 3 Encounters:  11/17/20 68  11/13/19 65  08/26/18 73   fam h/o cancer  He has had genetic testing/  neg  Hyperlipidemia Lab Results  Component Value Date   CHOL 155 10/23/2019   HDL 32.70 (L) 10/23/2019   LDLCALC 104 (H) 10/23/2019   TRIG 89.0 10/23/2019   CHOLHDL 5 10/23/2019   labs from work  Tot chol 174 Trig 95 HDL 34 LDL 122 Ratio 5.1  Eating less fried foods than he used to  LDL is up     Strong fam h/o vasc dz   Prediabetes Lab Results  Component Value Date   HGBA1C 5.8 10/23/2019  labs from work  Glucose 104 fasting   Nl TSH Nl cbc  Patient Active Problem List   Diagnosis Date Noted  . Genetic testing 09/15/2018  . Family history of genetic disease carrier 09/02/2018  . Family history of breast cancer   . Family history of pancreatic cancer   . Prostate cancer screening 08/17/2018  . History of colonic polyps   . Prediabetes 04/01/2016  . Left ankle pain 10/14/2015  . PVC's (premature ventricular contractions) 10/22/2013  . Colon cancer screening 05/23/2012  . Routine general medical examination at a health care facility 05/20/2012  . Knee pain, right 06/08/2011  . G E R D 08/04/2007  . Hyperlipidemia 07/22/2007  . Morbid obesity ([place]) 07/22/2007  . SMOKELESS TOBACCO ABUSE 07/22/2007  . Essential  hypertension 07/22/2007  . Sleep apnea 07/22/2007   Past Medical History:  Diagnosis Date  . Allergy   . Arthritis    right knee  . Esophageal reflux   . Family history of breast cancer   . Family history of genetic disease carrier    sister NBN pathogenic variant  . Family history of pancreatic cancer   . Fatty liver   . HLD (hyperlipidemia)   . HTN (hypertension)   . Morbid obesity ([place])   . Sleep apnea    USES CPAP   . Tobacco use disorder   . Unspecified sleep apnea    Past Surgical History:  Procedure Laterality Date  . COLONOSCOPY  2013  . COLONOSCOPY WITH PROPOFOL N/A 05/20/2018   Procedure: COLONOSCOPY WITH PROPOFOL;  Surgeon: Jerene Bears, MD;  Location: [facility];  Service: Gastroenterology;  Laterality: N/A;  . KNEE  ARTHROSCOPY     left   Social History   Tobacco Use  . Smoking status: Former Smoker    Quit date: 12/31/1982    Years since quitting: 37.9  . Smokeless tobacco: Current User    Types: Snuff  Vaping Use  . Vaping Use: Never used  Substance Use Topics  . Alcohol use: Yes    Alcohol/week: 0.0 standard drinks    Comment: OCC BEER  . Drug use: No   Family History  Problem Relation Age of Onset  . Hypertension Mother   . Heart attack Mother        deceased  . Heart disease Mother   . Kidney disease Mother   . Heart attack Father 74       deceased  . Heart disease Father   . Breast cancer Sister 45       NBN +  . Lung cancer Brother   . Kidney disease Maternal Grandfather   . Colon cancer Neg Hx   . Stomach cancer Neg Hx   . Esophageal cancer Neg Hx   . Rectal cancer Neg Hx    No Known Allergies Current Outpatient Medications on File Prior to Visit  Medication Sig Dispense Refill  . Acetaminophen (TYLENOL ARTHRITIS PAIN PO) Take 650 mg by mouth 2 (two) times daily.    Marland Kitchen ALPRAZolam (XANAX) 0.5 MG tablet Take 1 tablet (0.5 mg total) by mouth daily as needed for anxiety. 30 tablet 0  . aspirin 325 MG tablet Take 325 mg by mouth daily.    . cetirizine (ZYRTEC) 10 MG tablet Take 10 mg by mouth daily.    . Cyanocobalamin (B-12 PO) Take 2 tablets by mouth daily. 2000 mcg daily    . FIBER PO Take 2 tablets by mouth daily.     Marland Kitchen GLUCOSAMINE-CHONDROITIN PO Take 2 tablets by mouth daily. 1500    . ibuprofen (ADVIL) 200 MG tablet Take 200 mg by mouth every 8 (eight) hours as needed.    . meloxicam (MOBIC) 15 MG tablet Take 15 mg by mouth daily.    . NON FORMULARY CPAP at night as directed     . Omega-3 Fatty Acids (FISH OIL) 1200 MG CPDR Take 2 capsules by mouth 2 (two) times daily.    Marland Kitchen pyridoxine (B-6) 100 MG tablet Take 100 mg by mouth daily.      No current facility-administered medications on file prior to visit.    Review of Systems  Constitutional: Negative for activity  change, appetite change, fatigue, fever and unexpected weight change.       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   Eyes: Negative for pain, redness, itching and visual disturbance.  Respiratory: Negative for cough, chest tightness, shortness of breath and wheezing.   Cardiovascular: Negative for chest pain and palpitations.  Gastrointestinal: Negative for abdominal pain, blood in stool, constipation, diarrhea and nausea.  Endocrine: Negative for cold intolerance, heat intolerance, polydipsia and polyuria.  Genitourinary: Negative for difficulty urinating, dysuria, frequency and urgency.  Musculoskeletal: Positive for arthralgias. Negative for joint swelling and myalgias.       Knee pain is slt better  Skin: Negative for pallor and rash.  Neurological: Negative for dizziness, tremors, weakness, numbness and headaches.  Hematological: Negative for adenopathy. Does not bruise/bleed easily.  Psychiatric/Behavioral: Negative for decreased concentration and dysphoric mood. The patient is not nervous/anxious.        Objective:   Physical Exam Constitutional:      General: He is not in acute distress.    Appearance: Normal appearance. He is well-developed. He is obese. He is not ill-appearing or diaphoretic.  HENT:     Head: Normocephalic and atraumatic.     Right Ear: Tympanic membrane, ear canal and external ear normal.     Left Ear: Tympanic membrane, ear canal and external ear normal.     Nose: Nose normal. No congestion.     Mouth/Throat:     Mouth: Mucous membranes are moist.     Pharynx: Oropharynx is clear. No posterior oropharyngeal erythema.  Eyes:     General: No scleral icterus.       Right eye: No discharge.        Left eye: No discharge.     Conjunctiva/sclera: Conjunctivae normal.     Pupils: Pupils are equal, round, and reactive to light.  Neck:     Thyroid: No  thyromegaly.     Vascular: No carotid bruit or JVD.  Cardiovascular:     Rate and Rhythm: Normal rate and regular rhythm.     Pulses: Normal pulses.     Heart sounds: Normal heart sounds. No gallop.   Pulmonary:     Effort: Pulmonary effort is normal. No respiratory distress.     Breath sounds: Normal breath sounds. No wheezing or rales.     Comments: Good air exch Chest:     Chest wall: No tenderness.  Abdominal:     General: Bowel sounds are normal. There is no distension or abdominal bruit.     Palpations: Abdomen is soft. There is no mass.     Tenderness: There is no abdominal tenderness.     Hernia: No hernia is present.  Musculoskeletal:        General: No tenderness.     Cervical back: Normal range of motion and neck supple. No rigidity. No muscular tenderness.     Right lower leg: No edema.     Left lower leg: No edema.     Comments: Knee crepitus noted   Lymphadenopathy:     Cervical: No cervical adenopathy.  Skin:    General: Skin is warm and dry.     Coloration: Skin is not pale.     Findings: No erythema or rash.     Comments: Solar lentigines diffusely Some skin tags  Neurological:     Mental Status: He is alert.     Cranial Nerves: No cranial nerve deficit.     Motor: No abnormal muscle tone.  Coordination: Coordination normal.     Gait: Gait normal.     Deep Tendon Reflexes: Reflexes are normal and symmetric. Reflexes normal.  Psychiatric:        Mood and Affect: Mood normal.        Cognition and Memory: Cognition and memory normal.           Assessment & Plan:   Problem List Items Addressed This Visit      Cardiovascular and Mediastinum   Essential hypertension    bp in fair control at this time  BP Readings from Last 1 Encounters:  11/17/20 124/78   No changes needed Most recent labs reviewed  Disc lifstyle change with low sodium diet and exercise  Plan to continue lisinopril 20-25 mg daily       Relevant Medications   rosuvastatin  (CRESTOR) 5 MG tablet   lisinopril-hydrochlorothiazide (ZESTORETIC) 20-25 MG tablet     Other   Hyperlipidemia    Rev labs from work and LDL is up to 122 in setting of strong fam h/o vascular dz  Disc goals for lipids and reasons to control them Rev last labs with pt Rev low sat fat diet in detail  Plan to start crestor 5 mg once daily  Lab in 6 wk  Will call if side eff or problems       Relevant Medications   rosuvastatin (CRESTOR) 5 MG tablet   lisinopril-hydrochlorothiazide (ZESTORETIC) 20-25 MG tablet   Morbid obesity ([facility])    Discussed how this problem influences overall health and the risks it imposes  Reviewed plan for weight loss with lower calorie diet (via better food choices and also portion control or program like weight watchers) and exercise building up to or more than 30 minutes 5 days per week including some aerobic activity   Down close to 20 lb with diet and exercise change  Enc him to continue this      Routine general medical examination at a health care facility - Primary    Reviewed health habits including diet and exercise and skin cancer prevention Reviewed appropriate screening tests for age  Also reviewed health mt list, fam hx and immunization status , as well as social and family history   Commended on wt loss so far with diet and exercise  Flu shot given  covid immunized and planning booster Had shingrix vaccine  Labs reviewed from work incl psa of 1.6  Plan to start statin for cholesterol  a1c drawn for prediabetes      Relevant Orders   Flu Vaccine QUAD 6+ mos PF IM (Fluarix Quad PF) (Completed)   Prediabetes    A1C drawn today  disc imp of low glycemic diet and wt loss to prevent DM2  Lab Results  Component Value Date   HGBA1C 5.8 (A) 11/17/2020   Stable  Wt loss should help       Relevant Orders   POCT glycosylated hemoglobin (Hb A1C) (Completed)   Prostate cancer screening    psa of 1.6 at work  No clinical changes or fam  hx Continue to follow        Other Visit Diagnoses    Need for influenza vaccination       Relevant Orders   Flu Vaccine QUAD 6+ mos PF IM (Fluarix Quad PF) (Completed)

## 2020-11-17 NOTE — Assessment & Plan Note (Signed)
A1C drawn today  disc imp of low glycemic diet and wt loss to prevent DM2  Lab Results  Component Value Date   HGBA1C 5.8 (A) 11/17/2020   Stable  Wt loss should help

## 2020-11-17 NOTE — Assessment & Plan Note (Signed)
Discussed how this problem influences overall health and the risks it imposes  Reviewed plan for weight loss with lower calorie diet (via better food choices and also portion control or program like weight watchers) and exercise building up to or more than 30 minutes 5 days per week including some aerobic activity   Down close to 20 lb with diet and exercise change  Enc him to continue this

## 2020-11-17 NOTE — Assessment & Plan Note (Signed)
Rev labs from work and LDL is up to 122 in setting of strong fam h/o vascular dz  Disc goals for lipids and reasons to control them Rev last labs with pt Rev low sat fat diet in detail  Plan to start crestor 5 mg once daily  Lab in 6 wk  Will call if side eff or problems

## 2020-11-17 NOTE — Assessment & Plan Note (Signed)
bp in fair control at this time  BP Readings from Last 1 Encounters:  11/17/20 124/78   No changes needed Most recent labs reviewed  Disc lifstyle change with low sodium diet and exercise  Plan to continue lisinopril 20-25 mg daily

## 2020-11-17 NOTE — Assessment & Plan Note (Signed)
psa of 1.6 at work  No clinical changes or fam hx Continue to follow

## 2021-11-03 ENCOUNTER — Other Ambulatory Visit: Payer: Self-pay | Admitting: Family Medicine

## 2021-11-03 NOTE — Telephone Encounter (Signed)
Lvm for pt to call office to get scheduled for cpe/lab

## 2021-11-03 NOTE — Telephone Encounter (Signed)
Pt needs a CPE scheduled on or after 11/18/21, please schedule appt then route med back to me to refill med. Thanks

## 2021-11-06 NOTE — Telephone Encounter (Signed)
2nd attempt  LMTCB to schedule appts

## 2021-11-07 NOTE — Telephone Encounter (Signed)
Tried calling the patient three separate times and each times today has gone straight to voicemail. LVM for him to call back in regards to setting up an appointment for his CPE/LAB.

## 2021-11-13 ENCOUNTER — Telehealth: Payer: Self-pay | Admitting: Family Medicine

## 2021-11-13 DIAGNOSIS — I10 Essential (primary) hypertension: Secondary | ICD-10-CM

## 2021-11-13 DIAGNOSIS — R7303 Prediabetes: Secondary | ICD-10-CM

## 2021-11-13 DIAGNOSIS — E7800 Pure hypercholesterolemia, unspecified: Secondary | ICD-10-CM

## 2021-11-13 DIAGNOSIS — Z125 Encounter for screening for malignant neoplasm of prostate: Secondary | ICD-10-CM

## 2021-11-13 NOTE — Telephone Encounter (Signed)
-----   Message from Ellamae Sia sent at 11/07/2021  9:59 AM EST ----- Regarding: Lab orders for Tuesday, 11.15.22 Patient is scheduled for CPX labs, please order future labs, Thanks , Karna Christmas

## 2021-11-14 ENCOUNTER — Other Ambulatory Visit: Payer: 59

## 2021-11-20 ENCOUNTER — Other Ambulatory Visit: Payer: Self-pay

## 2021-11-20 ENCOUNTER — Ambulatory Visit (INDEPENDENT_AMBULATORY_CARE_PROVIDER_SITE_OTHER): Payer: 59 | Admitting: Family Medicine

## 2021-11-20 ENCOUNTER — Encounter: Payer: Self-pay | Admitting: Family Medicine

## 2021-11-20 VITALS — BP 126/78 | HR 69 | Temp 98.2°F | Ht 69.5 in | Wt 375.5 lb

## 2021-11-20 DIAGNOSIS — R7303 Prediabetes: Secondary | ICD-10-CM | POA: Diagnosis not present

## 2021-11-20 DIAGNOSIS — I10 Essential (primary) hypertension: Secondary | ICD-10-CM | POA: Diagnosis not present

## 2021-11-20 DIAGNOSIS — E7800 Pure hypercholesterolemia, unspecified: Secondary | ICD-10-CM | POA: Diagnosis not present

## 2021-11-20 DIAGNOSIS — Z0000 Encounter for general adult medical examination without abnormal findings: Secondary | ICD-10-CM | POA: Diagnosis not present

## 2021-11-20 DIAGNOSIS — Z125 Encounter for screening for malignant neoplasm of prostate: Secondary | ICD-10-CM | POA: Diagnosis not present

## 2021-11-20 DIAGNOSIS — Z79899 Other long term (current) drug therapy: Secondary | ICD-10-CM | POA: Diagnosis not present

## 2021-11-20 DIAGNOSIS — Z23 Encounter for immunization: Secondary | ICD-10-CM | POA: Diagnosis not present

## 2021-11-20 MED ORDER — ROSUVASTATIN CALCIUM 5 MG PO TABS: 5.0000 mg | ORAL_TABLET | Freq: Every day | ORAL | 3 refills | Status: DC

## 2021-11-20 MED ORDER — OMEPRAZOLE 40 MG PO CPDR: DELAYED_RELEASE_CAPSULE | ORAL | 3 refills | Status: DC

## 2021-11-20 MED ORDER — LISINOPRIL-HYDROCHLOROTHIAZIDE 20-25 MG PO TABS: 1.0000 | ORAL_TABLET | Freq: Every day | ORAL | 3 refills | Status: DC

## 2021-11-20 NOTE — Assessment & Plan Note (Signed)
bp in fair control at this time  BP Readings from Last 1 Encounters:  11/20/21 126/78   No changes needed Most recent labs reviewed  Disc lifstyle change with low sodium diet and exercise  Lisinopril hct 20-25 mg

## 2021-11-20 NOTE — Assessment & Plan Note (Signed)
A1C ordered Diet is not optimal  Wishes to loose wt  disc imp of low glycemic diet and wt loss to prevent DM2

## 2021-11-20 NOTE — Assessment & Plan Note (Signed)
Disc goals for lipids and reasons to control them Rev last labs with pt Rev low sat fat diet in detail Labs today Diet is fair  Plan to continue crestor 5 mg daily

## 2021-11-20 NOTE — Assessment & Plan Note (Signed)
Works well/ omeprazole 40 mg daily   Cannot miss a day  Enc to watch diet  May be able to cut dose with wt loss in the future

## 2021-11-20 NOTE — Progress Notes (Signed)
Subjective:    Patient ID: Charles Morales, male    DOB: 1961-06-23, 60 y.o.   MRN: [id number]  This visit occurred during the SARS-CoV-2 public health emergency.  Safety protocols were in place, including screening questions prior to the visit, additional usage of staff PPE, and extensive cleaning of exam room while observing appropriate contact time as indicated for disinfecting solutions.   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   Needs time to take care of himself in the future   Covid immunized Flu shot -today  Tdap 04/2012 Shingrix done  Colonoscopy 04/2018 with 5 y recall   Prostate health Lab Results  Component Value Date   PSA 1.36 10/23/2019   PSA 1.67 08/18/2018  The last 6 mo - nocturia occasionally -once at most     HTN bp is stable today  No cp or palpitations or headaches or edema  No side effects to medicines  BP Readings from Last 3 Encounters:  11/20/21 126/78  11/17/20 124/78  11/13/19 108/70     Lisinopril hct 20-25 mg daily    Pulse Readings from Last 3 Encounters:  11/20/21 69  11/17/20 68  11/13/19 65      Takes a ppi No results found for: VITAMINB12  Hyperlipidemia Lab Results  Component Value Date   CHOL 155 10/23/2019   HDL 32.70 (L) 10/23/2019   LDLCALC 104 (H) 10/23/2019   TRIG 89.0 10/23/2019   CHOLHDL 5 10/23/2019   Crestor 5 mg daily  Prediabetes Lab Results  Component Value Date   HGBA1C 5.8 (A) 11/17/2020   Due for labs   Eating terribly  Gained the weight he  gained back  He is an emotional eater-this is a big problem  Schedule/working nights is bad for nutrition  Wife is seeing nutritionist - both want to follow it   Knees keep him from exercising (R one is worse) Wears a brace   Patient Active Problem List   Diagnosis Date Noted   Current use of proton pump inhibitor 11/20/2021   Genetic testing 09/15/2018   Family history of genetic disease carrier 09/02/2018   Family history of breast cancer    Family history of pancreatic cancer    Prostate cancer screening 08/17/2018   History of colonic polyps    Prediabetes 04/01/2016   Left ankle pain 10/14/2015   PVC's (premature ventricular contractions) 10/22/2013   Colon cancer screening 05/23/2012   Routine general medical examination at a health care facility 05/20/2012   Knee pain, right 06/08/2011   G E R D 08/04/2007   Hyperlipidemia 07/22/2007   Morbid obesity ([place]) 07/22/2007   SMOKELESS TOBACCO ABUSE 07/22/2007   Essential hypertension 07/22/2007   Sleep apnea 07/22/2007   Past Medical History:  Diagnosis Date   Allergy    Arthritis    right knee   Esophageal reflux    Family history of breast cancer    Family  history of genetic disease carrier    sister NBN pathogenic variant   Family history of pancreatic cancer    Fatty liver    HLD (hyperlipidemia)    HTN (hypertension)    Morbid obesity ([place])    Sleep apnea    USES CPAP    Tobacco use disorder    Unspecified sleep apnea    Past Surgical History:  Procedure Laterality Date   COLONOSCOPY  2013   COLONOSCOPY WITH PROPOFOL N/A 05/20/2018   Procedure: COLONOSCOPY WITH PROPOFOL;  Surgeon: Jerene Bears, MD;  Location: [facility];  Service: Gastroenterology;  Laterality: N/A;   KNEE ARTHROSCOPY     left   Social History   Tobacco Use   Smoking status: Former   Smokeless tobacco: Current    Types: Snuff  Vaping Use   Vaping Use: Never used  Substance Use Topics   Alcohol use: Yes    Alcohol/week: 0.0  standard drinks    Comment: OCC BEER   Drug use: No   Family History  Problem Relation Age of Onset   Hypertension Mother    Heart attack Mother        deceased   Heart disease Mother    Kidney disease Mother    Heart attack Father 53       deceased   Heart disease Father    Breast cancer Sister 6       NBN +   Lung cancer Brother    Kidney disease Maternal Grandfather    Colon cancer Neg Hx    Stomach cancer Neg Hx    Esophageal cancer Neg Hx    Rectal cancer Neg Hx    No Known Allergies Current Outpatient Medications on File Prior to Visit  Medication Sig Dispense Refill   Acetaminophen (TYLENOL ARTHRITIS PAIN PO) Take 650 mg by mouth 2 (two) times daily.     ALPRAZolam (XANAX) 0.5 MG tablet Take 1 tablet (0.5 mg total) by mouth daily as needed for anxiety. 30 tablet 0   aspirin 325 MG tablet Take 325 mg by mouth daily.     cetirizine (ZYRTEC) 10 MG tablet Take 10 mg by mouth daily.     Cyanocobalamin (B-12 PO) Take 2 tablets by mouth daily. 2000 mcg daily     FIBER PO Take 2 tablets by mouth daily.      GLUCOSAMINE-CHONDROITIN PO Take 2 tablets by mouth daily. 1500     ibuprofen (ADVIL) 200 MG tablet Take 200 mg by mouth every 8 (eight) hours as needed.     NON FORMULARY CPAP at night as directed      Omega-3 Fatty Acids (FISH OIL) 1200 MG CPDR Take 2 capsules by mouth 2 (two) times daily.     pyridoxine (B-6) 100 MG tablet Take 100 mg by mouth daily.      meloxicam (MOBIC) 15 MG tablet Take 15 mg by mouth daily. (Patient not taking: Reported on 11/20/2021)     No current facility-administered medications on file prior to visit.    Review of Systems  Constitutional:  Negative for chills and fever.  HENT:  Negative for congestion, ear pain, sinus pain and sore throat.   Eyes:  Negative for discharge and redness.  Respiratory:  Negative for cough, shortness of breath and stridor.   Cardiovascular:  Negative for chest pain, palpitations and leg swelling.   Gastrointestinal:  Negative for abdominal pain, diarrhea, nausea and vomiting.  Musculoskeletal:  Positive for arthralgias. Negative for myalgias.  Skin:  Negative for rash.  Neurological:  Negative for dizziness and headaches.      Objective:   Physical Exam Constitutional:      General: He is not in acute distress.    Appearance: Normal appearance. He is well-developed. He is obese. He is not ill-appearing or diaphoretic.  HENT:     Head: Normocephalic and atraumatic.     Right Ear: Tympanic membrane, ear canal and external ear normal.     Left Ear: Tympanic membrane, ear canal and external ear normal.     Nose: Nose normal. No congestion.     Mouth/Throat:     Mouth: Mucous membranes are moist.     Pharynx: Oropharynx is clear. No posterior oropharyngeal erythema.  Eyes:     General: No scleral icterus.       Right eye: No discharge.        Left eye: No discharge.     Conjunctiva/sclera: Conjunctivae normal.     Pupils: Pupils are equal, round, and reactive to light.  Neck:     Thyroid: No thyromegaly.     Vascular: No carotid bruit or JVD.  Cardiovascular:     Rate and Rhythm: Normal rate and regular rhythm.     Pulses: Normal pulses.     Heart sounds: Normal heart sounds.    No gallop.  Pulmonary:     Effort: Pulmonary effort is normal. No respiratory distress.     Breath sounds: Normal breath sounds. No wheezing or rales.     Comments: Good air exch Chest:     Chest wall: No tenderness.  Abdominal:     General: Bowel sounds are normal. There is no distension or abdominal bruit.     Palpations: Abdomen is soft. There is no mass.     Tenderness: There is no abdominal tenderness.     Hernia: No hernia is present.  Musculoskeletal:        General: No tenderness.     Cervical back: Normal range of motion and neck supple. No rigidity. No muscular tenderness.     Right lower leg: No edema.     Left lower leg: No edema.  Lymphadenopathy:     Cervical: No cervical  adenopathy.  Skin:    General: Skin is warm and dry.     Coloration: Skin is not pale.     Findings: No erythema or rash.     Comments: Some brown discoloration on legs bilat   Solar lentigines diffusely   Neurological:     Mental Status: He is alert.     Cranial Nerves: No cranial nerve deficit.     Motor: No abnormal muscle tone.     Coordination: Coordination normal.     Gait: Gait normal.     Deep Tendon Reflexes: Reflexes are normal and symmetric. Reflexes normal.  Psychiatric:        Mood and Affect: Mood normal.        Cognition and Memory: Cognition and memory normal.          Assessment & Plan:   Problem List Items Addressed This Visit       Cardiovascular and Mediastinum   Essential hypertension    bp in fair control at this time  BP Readings from Last 1 Encounters:  11/20/21 126/78  No changes needed Most recent labs reviewed  Disc lifstyle change with low sodium diet and exercise  Lisinopril hct 20-25 mg       Relevant Medications  lisinopril-hydrochlorothiazide (ZESTORETIC) 20-25 MG tablet   rosuvastatin (CRESTOR) 5 MG tablet     Other   Hyperlipidemia    Disc goals for lipids and reasons to control them Rev last labs with pt Rev low sat fat diet in detail Labs today Diet is fair  Plan to continue crestor 5 mg daily      Relevant Medications   lisinopril-hydrochlorothiazide (ZESTORETIC) 20-25 MG tablet   rosuvastatin (CRESTOR) 5 MG tablet   Morbid obesity ([facility])    Discussed how this problem influences overall health and the risks it imposes  Reviewed plan for weight loss with lower calorie diet (via better food choices and also portion control or program like weight watchers) and exercise building up to or more than 30 minutes 5 days per week including some aerobic activity   [facility]      Relevant Orders   Amb Ref to Medical Weight Management   Routine general medical examination at a health care  facility - Primary    Reviewed health habits including diet and exercise and skin cancer prevention Reviewed appropriate screening tests for age  Also reviewed health mt list, fam hx and immunization status , as well as social and family history   Labs ordered  covid immunized  Flu shot given today  Colonoscopy utd  Disc [facility]  Pt is motivated to start changing lifestyle       Prediabetes    A1C ordered Diet is not optimal  Wishes to loose wt  disc imp of low glycemic diet and wt loss to prevent DM2       Prostate cancer screening    psa added to labs  No voiding changes -occ nocturia  No fam hx of prostate cancer      Current use of proton pump inhibitor    Works well/ omeprazole 40 mg daily   Cannot miss a day  Enc to watch diet  May be able to cut dose with wt loss in the future      Relevant Orders   Vitamin B12   Other Visit Diagnoses     Need for influenza vaccination       Relevant Orders   Flu Vaccine QUAD 6+ mos PF IM (Fluarix Quad PF) (Completed)

## 2021-11-20 NOTE — Patient Instructions (Addendum)
Labs today   I will put in a referral for [facility]  You will get a call  There will be a waiting time to get in   Take care of yourself   Flu shot today   Labs today

## 2021-11-20 NOTE — Assessment & Plan Note (Signed)
Discussed how this problem influences overall health and the risks it imposes  Reviewed plan for weight loss with lower calorie diet (via better food choices and also portion control or program like weight watchers) and exercise building up to or more than 30 minutes 5 days per week including some aerobic activity   [facility]

## 2021-11-20 NOTE — Assessment & Plan Note (Signed)
psa added to labs  No voiding changes -occ nocturia  No fam hx of prostate cancer

## 2021-11-20 NOTE — Assessment & Plan Note (Signed)
Reviewed health habits including diet and exercise and skin cancer prevention Reviewed appropriate screening tests for age  Also reviewed health mt list, fam hx and immunization status , as well as social and family history   Labs ordered  covid immunized  Flu shot given today  Colonoscopy utd  Disc [facility]  Pt is motivated to start changing lifestyle

## 2021-11-21 LAB — CBC WITH DIFFERENTIAL/PLATELET
Basophils Absolute: 0 10*3/uL (ref 0.0–0.1)
Basophils Relative: 0.6 % (ref 0.0–3.0)
Eosinophils Absolute: 0.1 10*3/uL (ref 0.0–0.7)
Eosinophils Relative: 2 % (ref 0.0–5.0)
HCT: 46.7 % (ref 39.0–52.0)
Hemoglobin: 15.5 g/dL (ref 13.0–17.0)
Lymphocytes Relative: 20.5 % (ref 12.0–46.0)
Lymphs Abs: 1.4 10*3/uL (ref 0.7–4.0)
MCHC: 33.2 g/dL (ref 30.0–36.0)
MCV: 90.5 fl (ref 78.0–100.0)
Monocytes Absolute: 0.6 10*3/uL (ref 0.1–1.0)
Monocytes Relative: 9 % (ref 3.0–12.0)
Neutro Abs: 4.6 10*3/uL (ref 1.4–7.7)
Neutrophils Relative %: 67.9 % (ref 43.0–77.0)
Platelets: 164 10*3/uL (ref 150.0–400.0)
RBC: 5.16 Mil/uL (ref 4.22–5.81)
RDW: 15.4 % (ref 11.5–15.5)
WBC: 6.8 10*3/uL (ref 4.0–10.5)

## 2021-11-21 LAB — HEMOGLOBIN A1C: Hgb A1c MFr Bld: 6 % (ref 4.6–6.5)

## 2021-11-21 LAB — COMPREHENSIVE METABOLIC PANEL
ALT: 26 U/L (ref 0–53)
AST: 21 U/L (ref 0–37)
Albumin: 4.2 g/dL (ref 3.5–5.2)
Alkaline Phosphatase: 47 U/L (ref 39–117)
BUN: 18 mg/dL (ref 6–23)
CO2: 29 mEq/L (ref 19–32)
Calcium: 9.6 mg/dL (ref 8.4–10.5)
Chloride: 103 mEq/L (ref 96–112)
Creatinine, Ser: 0.95 mg/dL (ref 0.40–1.50)
GFR: 87.77 mL/min (ref >60.00)
Glucose, Bld: 94 mg/dL (ref 70–99)
Potassium: 4.3 mEq/L (ref 3.5–5.1)
Sodium: 141 mEq/L (ref 135–145)
Total Bilirubin: 0.7 mg/dL (ref 0.2–1.2)
Total Protein: 6.8 g/dL (ref 6.0–8.3)

## 2021-11-21 LAB — LIPID PANEL
Cholesterol: 120 mg/dL (ref 0–200)
HDL: 38.3 mg/dL — ABNORMAL LOW (ref >39.00)
LDL Cholesterol: 65 mg/dL (ref 0–99)
NonHDL: 82.14
Total CHOL/HDL Ratio: 3
Triglycerides: 88 mg/dL (ref 0.0–149.0)
VLDL: 17.6 mg/dL (ref 0.0–40.0)

## 2021-11-21 LAB — VITAMIN B12: Vitamin B-12: 704 pg/mL (ref 211–911)

## 2021-11-21 LAB — PSA: PSA: 1.12 ng/mL (ref 0.10–4.00)

## 2021-11-21 LAB — TSH: TSH: 0.77 u[IU]/mL (ref 0.35–5.50)

## 2022-01-31 ENCOUNTER — Encounter: Payer: Self-pay | Admitting: Family Medicine

## 2022-05-16 ENCOUNTER — Ambulatory Visit: Payer: 59 | Admitting: Orthopaedic Surgery

## 2022-05-16 ENCOUNTER — Ambulatory Visit (INDEPENDENT_AMBULATORY_CARE_PROVIDER_SITE_OTHER): Payer: 59

## 2022-05-16 VITALS — Ht 69.5 in | Wt 378.0 lb

## 2022-05-16 DIAGNOSIS — M25561 Pain in right knee: Secondary | ICD-10-CM | POA: Diagnosis not present

## 2022-05-16 DIAGNOSIS — G8929 Other chronic pain: Secondary | ICD-10-CM | POA: Diagnosis not present

## 2022-05-16 NOTE — Progress Notes (Signed)
Patient is a very pleasant 61 year old gentleman with unfortunate debilitating arthritis involving his right knee.  He has had conservative treatment for many years now including multiple steroid injections and hyaluronic acid injections.  He does wear knee sleeve.  He alternates Tylenol arthritis and Aleve.  None of this is helping at this point.  The knee feels unstable to him and has become very painful with weightbearing.  He is not a diabetic.  He does work for the [employer], [employer].  His BMI is 55.02. ? ?On exam his right knee has slight varus malalignment.  There is not a very large soft tissue envelope around his knee.  He has good range of motion of the knee but is very painful throughout the arc of motion. ? ?2 views of the right knee show severe end-stage arthritis of the right great with basically bone-on-bone wear. ? ?I counseled him extensively about weight loss.  We need to document some weight loss before we can schedule him for for knee replacement.  I described in detail why this is the case and with a higher incidence of failure of knee replacements and people with higher BMIs.  However, if we can document a weight loss journey I would feel more comfortable with setting up for a right knee replacement.  We will see him back in 3 months for repeat weight and BMI calculation.  No x-rays are needed.  All questions and concerns were answered and addressed. ?

## 2022-08-20 ENCOUNTER — Ambulatory Visit: Payer: 59 | Admitting: Orthopaedic Surgery

## 2022-08-20 ENCOUNTER — Encounter: Payer: Self-pay | Admitting: Orthopaedic Surgery

## 2022-08-20 VITALS — Ht 69.5 in | Wt 342.2 lb

## 2022-08-20 DIAGNOSIS — M25561 Pain in right knee: Secondary | ICD-10-CM | POA: Diagnosis not present

## 2022-08-20 DIAGNOSIS — M1711 Unilateral primary osteoarthritis, right knee: Secondary | ICD-10-CM | POA: Diagnosis not present

## 2022-08-20 DIAGNOSIS — G8929 Other chronic pain: Secondary | ICD-10-CM

## 2022-08-20 NOTE — Progress Notes (Signed)
The patient is a 61 year old gentleman who is well-known to me.  He just turned 57 yesterday.  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  He has tried and failed all forms of conservative treatment for several years now and his pain is worsened over the last year with the right knee.  He is someone who has been on a weight loss journey.  When I first saw him his weight was 378 pounds.  In just 3 months he has lost over 35 pounds and his BMI now is below 50.  He is not a diabetic.  With already taking weight off of his frame and down to 342 pounds he feels great and healthier.  He is continuing on this weight loss journey.  His wife actually works for a clinic that is helping with this as well.  He currently denies any health issues.  He denies any headache, chest pain, shortness of breath, fever, chills, nausea, vomiting.  Examination of his right knee does show some varus malalignment with significant medial joint line tenderness and patellofemoral crepitation and tenderness.  The knee is ligamentously stable.  X-rays from earlier this year of the right knee show severe end-stage arthritis with varus malalignment and osteophytes in all 3 compartments.  There is bone-on-bone wear the medial compartment and the patellofemoral joint.  At this point I am comfortable with proceeding with knee replacement surgery given the amount of weight the patient is lost and knowing that he is incredibly motivated and will continue on this weight loss journey.  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  He understands that he will need to be out of work potentially 6 to 8 weeks but a lot depends on his recovery.  I am comfortable with scheduling the surgery based on his clinical exam findings and his weight loss combined with his x-ray findings showing severe arthritis of the right knee.  We will work on getting this scheduled in the near future.  All question concerns were answered and addressed.

## 2022-09-06 ENCOUNTER — Other Ambulatory Visit: Payer: Self-pay

## 2022-09-27 ENCOUNTER — Other Ambulatory Visit: Payer: Self-pay | Admitting: Physician Assistant

## 2022-10-03 NOTE — Patient Instructions (Addendum)
SURGICAL WAITING ROOM VISITATION Patients having surgery or a procedure may have no more than 2 support people in the waiting area - these visitors may rotate in the visitor waiting room.   Children under the age of 45 must have an adult with them who is not the patient. If the patient needs to stay at the hospital during part of their recovery, the visitor guidelines for inpatient rooms apply.  PRE-OP VISITATION  Pre-op nurse will coordinate an appropriate time for 1 support person to accompany the patient in pre-op.  This support person may not rotate.  This visitor will be contacted when the time is appropriate for the visitor to come back in the pre-op area.  Please refer to the [facility] website for the visitor guidelines for Inpatients (after your surgery is over and you are in a regular room).  You are not required to quarantine at this time prior to your surgery. However, you must do this: Hand Hygiene often Do NOT share personal items Notify your provider if you are in close contact with someone who has COVID or you develop fever 100.4 or greater, new onset of sneezing, cough, sore throat, shortness of breath or body aches.   If you received a COVID test during your pre-op visit  it is requested that you wear a mask when out in public, stay away from anyone that may not be feeling well and notify your surgeon if you develop symptoms. If you test positive for Covid or have been in contact with anyone that has tested positive in the last 10 days please notify you surgeon.       Your procedure is scheduled on:  Friday  October 12, 2022  Report to [facility] Main Entrance.  Report to admitting at:  07:30   AM  +++++Call this number if you have any questions or problems the morning of surgery [phone]  Do not eat food :After Midnight the night prior to your surgery/procedure.  After Midnight you may have the following liquids until   07:00 AM  DAY OF SURGERY  Clear  Liquid Diet Water Black Coffee (sugar ok, NO MILK/CREAM OR CREAMERS)  Tea (sugar ok, NO MILK/CREAM OR CREAMERS) regular and decaf                             Plain Jell-O  with no fruit (NO RED)                                           Fruit ices (not with fruit pulp, NO RED)                                     Popsicles (NO RED)                                                                  Juice: apple, WHITE grape, WHITE cranberry Sports drinks like Gatorade or Powerade (NO RED)  The day of surgery:  Drink ONE (1) Pre-Surgery G2 at   07:00   AM the morning of surgery. Drink in one sitting. Do not sip.  This drink was given to you during your hospital pre-op appointment visit. Nothing else to drink after completing the Pre-Surgery G2 : No candy, chewing gum or throat lozenges.    FOLLOW ANY ADDITIONAL PRE OP INSTRUCTIONS YOU RECEIVED FROM YOUR SURGEON'S OFFICE!!!   Oral Hygiene is also important to reduce your risk of infection.        Remember - BRUSH YOUR TEETH THE MORNING OF SURGERY WITH YOUR REGULAR TOOTHPASTE  Do NOT smoke or chew after Midnight the night before surgery.  Take ONLY these medicines the morning of surgery with A SIP OF WATER: Omeprazole (Prilosec) and if needed you may take alprazolam (Xanax)   If You have been diagnosed with Sleep Apnea - Bring CPAP mask and tubing day of surgery. We will provide you with a CPAP machine on the day of your surgery.                   You may not have any metal on your body including  jewelry, and body piercing  Do not wear lotions, powders,  cologne, or deodorant   Men may shave face and neck.   You may bring a small overnight bag with you on the day of surgery, only pack items that are not valuable .[facility] IS NOT RESPONSIBLE   FOR VALUABLES THAT ARE LOST OR STOLEN.   DO NOT [place]. PHARMACY WILL DISPENSE MEDICATIONS LISTED ON YOUR MEDICATION LIST TO YOU  DURING YOUR ADMISSION [place]!    Special Instructions: Bring a copy of your healthcare power of attorney and living will documents the day of surgery, if you wish to have them scanned into your [facility] Records- EPIC  Please read over the following fact sheets you were given: IF YOU HAVE QUESTIONS ABOUT YOUR PRE-OP INSTRUCTIONS, PLEASE CALL [phone]  ([place])   [facility] - Preparing for Surgery Before surgery, you can play an important role.  Because skin is not sterile, your skin needs to be as free of germs as possible.  You can reduce the number of germs on your skin by washing with CHG (chlorahexidine gluconate) soap before surgery.  CHG is an antiseptic cleaner which kills germs and bonds with the skin to continue killing germs even after washing. Please DO NOT use if you have an allergy to CHG or antibacterial soaps.  If your skin becomes reddened/irritated stop using the CHG and inform your nurse when you arrive at Short Stay. Do not shave (including legs and underarms) for at least 48 hours prior to the first CHG shower.  You may shave your face/neck.  Please follow these instructions carefully:  1.  Shower with CHG Soap the night before surgery and the  morning of surgery.  2.  If you choose to wash your hair, wash your hair first as usual with your normal  shampoo.  3.  After you shampoo, rinse your hair and body thoroughly to remove the shampoo.                             4.  Use CHG as you would any other liquid soap.  You can apply chg directly to the skin and wash.  Gently with a scrungie or clean  washcloth.  5.  Apply the CHG Soap to your body ONLY FROM THE NECK DOWN.   Do not use on face/ open                           Wound or open sores. Avoid contact with eyes, ears mouth and genitals (private parts).                       Wash face,  Genitals (private parts) with your normal soap.             6.  Wash thoroughly, paying special attention to the area  where your  surgery  will be performed.  7.  Thoroughly rinse your body with warm water from the neck down.  8.  DO NOT shower/wash with your normal soap after using and rinsing off the CHG Soap.            9.  Pat yourself dry with a clean towel.            10.  Wear clean pajamas.            11.  Place clean sheets on your bed the night of your first shower and do not  sleep with pets.  ON THE DAY OF SURGERY : Do not apply any lotions/deodorants the morning of surgery.  Please wear clean clothes to the hospital/surgery center.    FAILURE TO FOLLOW THESE INSTRUCTIONS MAY RESULT IN THE CANCELLATION OF YOUR SURGERY  PATIENT SIGNATURE_________________________________  NURSE SIGNATURE__________________________________  ________________________________________________________________________        Adam Phenix    An incentive spirometer is a tool that can help keep your lungs clear and active. This tool measures how well you are filling your lungs with each breath. Taking long deep breaths may help reverse or decrease the chance of developing breathing (pulmonary) problems (especially infection) following: A long period of time when you are unable to move or be active. BEFORE THE PROCEDURE  If the spirometer includes an indicator to show your best effort, your nurse or respiratory therapist will set it to a desired goal. If possible, sit up straight or lean slightly forward. Try not to slouch. Hold the incentive spirometer in an upright position. INSTRUCTIONS FOR USE  Sit on the edge of your bed if possible, or sit up as far as you can in bed or on a chair. Hold the incentive spirometer in an upright position. Breathe out normally. Place the mouthpiece in your mouth and seal your lips tightly around it. Breathe in slowly and as deeply as possible, raising the piston or the ball toward the top of the column. Hold your breath for 3-5 seconds or for as long as possible.  Allow the piston or ball to fall to the bottom of the column. Remove the mouthpiece from your mouth and breathe out normally. Rest for a few seconds and repeat Steps 1 through 7 at least 10 times every 1-2 hours when you are awake. Take your time and take a few normal breaths between deep breaths. The spirometer may include an indicator to show your best effort. Use the indicator as a goal to work toward during each repetition. After each set of 10 deep breaths, practice coughing to be sure your lungs are clear. If you have an incision (the cut made at the time of surgery), support your incision when coughing by placing a pillow or  rolled up towels firmly against it. Once you are able to get out of bed, walk around indoors and cough well. You may stop using the incentive spirometer when instructed by your caregiver.  RISKS AND COMPLICATIONS Take your time so you do not get dizzy or light-headed. If you are in pain, you may need to take or ask for pain medication before doing incentive spirometry. It is harder to take a deep breath if you are having pain. AFTER USE Rest and breathe slowly and easily. It can be helpful to keep track of a log of your progress. Your caregiver can provide you with a simple table to help with this. If you are using the spirometer at home, follow these instructions: [place] IF:  You are having difficultly using the spirometer. You have trouble using the spirometer as often as instructed. Your pain medication is not giving enough relief while using the spirometer. You develop fever of 100.5 F (38.1 C) or higher.                                                                                                    SEEK IMMEDIATE MEDICAL CARE IF:  You cough up bloody sputum that had not been present before. You develop fever of 102 F (38.9 C) or greater. You develop worsening pain at or near the incision site. MAKE SURE YOU:  Understand these  instructions. Will watch your condition. Will get help right away if you are not doing well or get worse. Document Released: 04/29/2007 Document Revised: 03/10/2012 Document Reviewed: 06/30/2007 [facility] Patient Information 2014 [place], [place].

## 2022-10-03 NOTE — Progress Notes (Addendum)
COVID Vaccine received:  '[]'$  No '[x]'$  Yes  x3 Date of any COVID positive Test in last 90 days:   None  PCP - Loura Pardon, MD Cardiologist - none   Chest x-ray - n/a EKG -  will do at PST Stress Test - n/a ECHO - n/a Cardiac Cath - n/a  Pacemaker/ICD device     '[x]'$  N/A Spinal Cord Stimulator:'[x]'$  No '[]'$  Yes      (Remind patient to bring remote DOS) Other Implants:   History of Sleep Apnea? '[]'$  No '[x]'$  Yes   Sleep Study Date:  2018 CPAP used?- '[]'$  No '[x]'$  Yes  (Patient to bring his mask & Tubing) I have sent the request for a CPAP machine for DOS.   Does the patient monitor blood sugar? '[x]'$  No '[]'$  Yes  '[]'$  N/A Does patient have a [employer] or Dexacom? '[x]'$  No '[]'$  Yes   Fasting Blood Sugar Ranges-  Checks Blood Sugar __0___ times a day  Blood Thinner Instructions:  none Aspirin Instructions:ASA 325 mg  Stopped already,  Last Dose: 09-28-22  ERAS Protocol Ordered: '[]'$  No  '[x]'$  Yes PRE-SURGERY '[]'$  ENSURE  '[x]'$  G2   Comments: Patient's weight is 337 lbs but Dr. Ninfa Linden has approved him for Knee replacement. Patient is currently in the [place] weight reduction program through [facility].   I have ordered a BARIBED for this patient.   Activity level: Patient can not climb a flight of stairs without difficulty; '[x]'$  No CP  '[x]'$  No SOB,  but would have _knee pain___   Anesthesia review: Remote hx PVCs, Fatty Liver disease, HTN, Pre-DM (Diet)  Patient denies shortness of breath, fever, cough and chest pain at PAT appointment.  Patient verbalized understanding and agreement to the Pre-Surgical Instructions that were given to them at this PAT appointment. Patient was also educated of the need to review these PAT instructions again prior to his/her surgery.I reviewed the appropriate phone numbers to call if they have any and questions or concerns.

## 2022-10-05 ENCOUNTER — Other Ambulatory Visit: Payer: Self-pay

## 2022-10-05 ENCOUNTER — Encounter (HOSPITAL_COMMUNITY)
Admission: RE | Admit: 2022-10-05 | Discharge: 2022-10-05 | Disposition: A | Discharge status: Home / Self Care | Payer: 59 | Source: Ambulatory Visit | Attending: Orthopaedic Surgery | Admitting: Orthopaedic Surgery

## 2022-10-05 ENCOUNTER — Encounter (HOSPITAL_COMMUNITY): Payer: Self-pay

## 2022-10-05 VITALS — BP 129/92 | HR 60 | Temp 97.9°F | Resp 20 | Ht 69.5 in | Wt 337.0 lb

## 2022-10-05 DIAGNOSIS — R7303 Prediabetes: Secondary | ICD-10-CM | POA: Diagnosis not present

## 2022-10-05 DIAGNOSIS — I10 Essential (primary) hypertension: Secondary | ICD-10-CM | POA: Diagnosis not present

## 2022-10-05 DIAGNOSIS — Z01818 Encounter for other preprocedural examination: Secondary | ICD-10-CM | POA: Diagnosis present

## 2022-10-05 DIAGNOSIS — K769 Liver disease, unspecified: Secondary | ICD-10-CM

## 2022-10-05 HISTORY — DX: Prediabetes: R73.03

## 2022-10-05 LAB — CBC
HCT: 48.5 % (ref 39.0–52.0)
Hemoglobin: 15.9 g/dL (ref 13.0–17.0)
MCH: 29.7 pg (ref 26.0–34.0)
MCHC: 32.8 g/dL (ref 30.0–36.0)
MCV: 90.5 fL (ref 80.0–100.0)
Platelets: 176 10*3/uL (ref 150–400)
RBC: 5.36 MIL/uL (ref 4.22–5.81)
RDW: 14.7 % (ref 11.5–15.5)
WBC: 7.5 10*3/uL (ref 4.0–10.5)
nRBC: 0 % (ref 0.0–0.2)

## 2022-10-05 LAB — GLUCOSE, CAPILLARY: Glucose-Capillary: 99 mg/dL (ref 70–99)

## 2022-10-05 LAB — COMPREHENSIVE METABOLIC PANEL
ALT: 22 U/L (ref 0–44)
AST: 19 U/L (ref 15–41)
Albumin: 3.8 g/dL (ref 3.5–5.0)
Alkaline Phosphatase: 47 U/L (ref 38–126)
Anion gap: 8 (ref 5–15)
BUN: 18 mg/dL (ref 6–20)
CO2: 25 mmol/L (ref 22–32)
Calcium: 9.2 mg/dL (ref 8.9–10.3)
Chloride: 104 mmol/L (ref 98–111)
Creatinine, Ser: 0.72 mg/dL (ref 0.61–1.24)
GFR, Estimated: >60 mL/min (ref >60)
Glucose, Bld: 93 mg/dL (ref 70–99)
Potassium: 3.6 mmol/L (ref 3.5–5.1)
Sodium: 137 mmol/L (ref 135–145)
Total Bilirubin: 0.9 mg/dL (ref 0.3–1.2)
Total Protein: 7.3 g/dL (ref 6.5–8.1)

## 2022-10-05 LAB — SURGICAL PCR SCREEN
MRSA, PCR: NEGATIVE
Staphylococcus aureus: NEGATIVE

## 2022-10-11 NOTE — H&P (Signed)
TOTAL KNEE ADMISSION H&P  Patient is being admitted for right total knee arthroplasty.  Subjective:  Chief Complaint:right knee pain.  HPI: Charles Morales, 61 y.o. male, has a history of pain and functional disability in the right knee due to arthritis and has failed non-surgical conservative treatments for greater than 12 weeks to includeNSAID's and/or analgesics, corticosteriod injections, viscosupplementation injections, flexibility and strengthening excercises, use of assistive devices, weight reduction as appropriate, and activity modification.  Onset of symptoms was gradual, starting 3 years ago with gradually worsening course since that time. The patient noted no past surgery on the right knee(s).  Patient currently rates pain in the right knee(s) at 10 out of 10 with activity. Patient has night pain, worsening of pain with activity and weight bearing, pain that interferes with activities of daily living, pain with passive range of motion, crepitus, and joint swelling.  Patient has evidence of subchondral sclerosis, periarticular osteophytes, and joint space narrowing by imaging studies. There is no active infection.  Patient Active Problem List   Diagnosis Date Noted   Unilateral primary osteoarthritis, right knee 08/20/2022   Current use of proton pump inhibitor 11/20/2021   Genetic testing 09/15/2018   Family history of genetic disease carrier 09/02/2018   Family history of breast cancer    Family history of pancreatic cancer    Prostate cancer screening 08/17/2018   History of colonic polyps    Prediabetes 04/01/2016   Left ankle pain 10/14/2015   PVC's (premature ventricular contractions) 10/22/2013   Colon cancer screening 05/23/2012   Routine general medical examination at a health care facility 05/20/2012   Knee pain, right 06/08/2011   G E R D 08/04/2007   Hyperlipidemia 07/22/2007   Morbid obesity ([place]) 07/22/2007   SMOKELESS TOBACCO ABUSE 07/22/2007   Essential  hypertension 07/22/2007   Sleep apnea 07/22/2007   Past Medical History:  Diagnosis Date   Allergy    Arthritis    right knee   Esophageal reflux    Family history of breast cancer    Family history of genetic disease carrier    sister NBN pathogenic variant   Family history of pancreatic cancer    Fatty liver    HLD (hyperlipidemia)    HTN (hypertension)    Morbid obesity ([place])    Pre-diabetes    Sleep apnea    USES CPAP    Tobacco use disorder    Unspecified sleep apnea     Past Surgical History:  Procedure Laterality Date   COLONOSCOPY  2013   COLONOSCOPY WITH PROPOFOL N/A 05/20/2018   Procedure: COLONOSCOPY WITH PROPOFOL;  Surgeon: Jerene Bears, MD;  Location: [facility];  Service: Gastroenterology;  Laterality: N/A;   KNEE ARTHROSCOPY     left    No current facility-administered medications for this encounter.   Current Outpatient Medications  Medication Sig Dispense Refill Last Dose   ALPRAZolam (XANAX) 0.5 MG tablet Take 1 tablet (0.5 mg total) by mouth daily as needed for anxiety. 30 tablet 0    aspirin 325 MG tablet Take 325 mg by mouth daily.      cetirizine (ZYRTEC) 10 MG tablet Take 10 mg by mouth daily.      cyanocobalamin (VITAMIN B12) 1000 MCG tablet Take 1,000 mcg by mouth daily.      diclofenac Sodium (VOLTAREN) 1 % GEL Apply 1 Application topically 4 (four) times daily as needed (pain).      diphenhydrAMINE HCl, Sleep, (ZZZQUIL) 50 MG/30ML LIQD Take 50  mg by mouth at bedtime as needed (sleep).      GLUCOSAMINE-CHONDROITIN PO Take 1 tablet by mouth 2 (two) times daily.      lisinopril-hydrochlorothiazide (ZESTORETIC) 20-25 MG tablet Take 1 tablet by mouth daily. 90 tablet 3    naproxen sodium (ALEVE) 220 MG tablet Take 440 mg by mouth 2 (two) times daily.      Omega-3 Fatty Acids (FISH OIL) 1200 MG CPDR Take 2,400 mg by mouth 2 (two) times daily.      omeprazole (PRILOSEC) 40 MG capsule TAKE 1 CAPSULE (40 MG TOTAL) BY MOUTH DAILY. 90 capsule 3     Oxymetazoline HCl (VICKS SINEX NA) Place 1 spray into the nose daily as needed (allergies).      rosuvastatin (CRESTOR) 5 MG tablet Take 1 tablet (5 mg total) by mouth daily. 90 tablet 3    NON FORMULARY CPAP at night as directed       No Known Allergies  Social History   Tobacco Use   Smoking status: Former    Types: Cigarettes    Quit date: 1993    Years since quitting: 30.7   Smokeless tobacco: Current    Types: Snuff  Substance Use Topics   Alcohol use: Yes    Comment: rare    Family History  Problem Relation Age of Onset   Hypertension Mother    Heart attack Mother        deceased   Heart disease Mother    Kidney disease Mother    Heart attack Father 73       deceased   Heart disease Father    Breast cancer Sister 47       NBN +   Lung cancer Brother    Kidney disease Maternal Grandfather    Colon cancer Neg Hx    Stomach cancer Neg Hx    Esophageal cancer Neg Hx    Rectal cancer Neg Hx      Review of Systems  Objective:  Physical Exam Vitals reviewed.  Constitutional:      Appearance: Normal appearance. He is obese.  HENT:     Head: Normocephalic and atraumatic.  Eyes:     Extraocular Movements: Extraocular movements intact.     Pupils: Pupils are equal, round, and reactive to light.  Cardiovascular:     Rate and Rhythm: Normal rate and regular rhythm.  Pulmonary:     Effort: Pulmonary effort is normal.     Breath sounds: Normal breath sounds.  Abdominal:     Palpations: Abdomen is soft.  Musculoskeletal:     Cervical back: Normal range of motion and neck supple.     Right knee: Effusion, bony tenderness and crepitus present. Decreased range of motion. Tenderness present over the medial joint line and lateral joint line. Abnormal alignment.  Neurological:     Mental Status: He is alert and oriented to person, place, and time.  Psychiatric:        Behavior: Behavior normal.     Vital signs in last 24 hours:    Labs:   Estimated body mass  index is 49.05 kg/m as calculated from the following:   Height as of 10/05/22: 5' 9.5" (1.765 m).   Weight as of 10/05/22: 152.9 kg.   Imaging Review Plain radiographs demonstrate severe degenerative joint disease of the right knee(s). The overall alignment ismild varus. The bone quality appears to be good for age and reported activity level.      Assessment/Plan:  End stage arthritis, right knee   The patient history, physical examination, clinical judgment of the provider and imaging studies are consistent with end stage degenerative joint disease of the right knee(s) and total knee arthroplasty is deemed medically necessary. The treatment options including medical management, injection therapy arthroscopy and arthroplasty were discussed at length. The risks and benefits of total knee arthroplasty were presented and reviewed. The risks due to aseptic loosening, infection, stiffness, patella tracking problems, thromboembolic complications and other imponderables were discussed. The patient acknowledged the explanation, agreed to proceed with the plan and consent was signed. Patient is being admitted for inpatient treatment for surgery, pain control, PT, OT, prophylactic antibiotics, VTE prophylaxis, progressive ambulation and ADL's and discharge planning. The patient is planning to be discharged home with home health services

## 2022-10-12 ENCOUNTER — Encounter (HOSPITAL_COMMUNITY): Payer: Self-pay | Admitting: Orthopaedic Surgery

## 2022-10-12 ENCOUNTER — Observation Stay (HOSPITAL_COMMUNITY)
Admission: RE | Admit: 2022-10-12 | Discharge: 2022-10-13 | Disposition: A | Discharge status: Home Health | Payer: 59 | Source: Ambulatory Visit | Attending: Orthopaedic Surgery | Admitting: Orthopaedic Surgery

## 2022-10-12 ENCOUNTER — Encounter (HOSPITAL_COMMUNITY)
Admission: RE | Disposition: A | Discharge status: Home Health | Payer: Self-pay | Source: Ambulatory Visit | Attending: Orthopaedic Surgery

## 2022-10-12 ENCOUNTER — Other Ambulatory Visit: Payer: Self-pay

## 2022-10-12 ENCOUNTER — Ambulatory Visit (HOSPITAL_COMMUNITY): Payer: 59 | Admitting: Anesthesiology

## 2022-10-12 ENCOUNTER — Ambulatory Visit (HOSPITAL_BASED_OUTPATIENT_CLINIC_OR_DEPARTMENT_OTHER): Payer: 59 | Admitting: Anesthesiology

## 2022-10-12 ENCOUNTER — Observation Stay (HOSPITAL_COMMUNITY): Payer: 59

## 2022-10-12 DIAGNOSIS — Z96651 Presence of right artificial knee joint: Secondary | ICD-10-CM

## 2022-10-12 DIAGNOSIS — I10 Essential (primary) hypertension: Secondary | ICD-10-CM | POA: Insufficient documentation

## 2022-10-12 DIAGNOSIS — Z7982 Long term (current) use of aspirin: Secondary | ICD-10-CM | POA: Diagnosis not present

## 2022-10-12 DIAGNOSIS — R7303 Prediabetes: Secondary | ICD-10-CM

## 2022-10-12 DIAGNOSIS — G4733 Obstructive sleep apnea (adult) (pediatric): Secondary | ICD-10-CM | POA: Diagnosis not present

## 2022-10-12 DIAGNOSIS — Z9989 Dependence on other enabling machines and devices: Secondary | ICD-10-CM | POA: Diagnosis not present

## 2022-10-12 DIAGNOSIS — Z79899 Other long term (current) drug therapy: Secondary | ICD-10-CM | POA: Insufficient documentation

## 2022-10-12 DIAGNOSIS — M1711 Unilateral primary osteoarthritis, right knee: Principal | ICD-10-CM | POA: Diagnosis present

## 2022-10-12 DIAGNOSIS — Z87891 Personal history of nicotine dependence: Secondary | ICD-10-CM | POA: Diagnosis not present

## 2022-10-12 HISTORY — PX: TOTAL KNEE ARTHROPLASTY: SHX125

## 2022-10-12 LAB — GLUCOSE, CAPILLARY: Glucose-Capillary: 98 mg/dL (ref 70–99)

## 2022-10-12 SURGERY — ARTHROPLASTY, KNEE, TOTAL
Anesthesia: Regional | Site: Knee | Laterality: Right

## 2022-10-12 MED ORDER — PROPOFOL 500 MG/50ML IV EMUL
INTRAVENOUS | Status: DC | PRN
  Administered 2022-10-12: 50 ug/kg/min via INTRAVENOUS

## 2022-10-12 MED ORDER — POLYETHYLENE GLYCOL 3350 17 G PO PACK: 17.0000 g | PACK | Freq: Every day | ORAL | Status: DC | PRN

## 2022-10-12 MED ORDER — ONDANSETRON HCL 4 MG/2ML IJ SOLN
INTRAMUSCULAR | Status: AC
  Filled 2022-10-12: qty 2

## 2022-10-12 MED ORDER — OXYCODONE HCL 5 MG PO TABS: 5.0000 mg | ORAL_TABLET | Freq: Once | ORAL | Status: DC | PRN

## 2022-10-12 MED ORDER — SODIUM CHLORIDE 0.9 % IV SOLN: INTRAVENOUS | Status: DC

## 2022-10-12 MED ORDER — MIDAZOLAM HCL 2 MG/2ML IJ SOLN
1.0000 mg | INTRAMUSCULAR | Status: AC
  Administered 2022-10-12: 1 mg via INTRAVENOUS
  Filled 2022-10-12: qty 2

## 2022-10-12 MED ORDER — ALUM & MAG HYDROXIDE-SIMETH 200-200-20 MG/5ML PO SUSP: 30.0000 mL | ORAL | Status: DC | PRN

## 2022-10-12 MED ORDER — METOCLOPRAMIDE HCL 5 MG/ML IJ SOLN: 5.0000 mg | Freq: Three times a day (TID) | INTRAMUSCULAR | Status: DC | PRN

## 2022-10-12 MED ORDER — LACTATED RINGERS IV SOLN: INTRAVENOUS | Status: DC

## 2022-10-12 MED ORDER — ROSUVASTATIN CALCIUM 5 MG PO TABS
5.0000 mg | ORAL_TABLET | Freq: Every day | ORAL | Status: DC
  Administered 2022-10-12 – 2022-10-13 (×2): 5 mg via ORAL
  Filled 2022-10-12 (×2): qty 1

## 2022-10-12 MED ORDER — SODIUM CHLORIDE 0.9 % IR SOLN
Status: DC | PRN
  Administered 2022-10-12: 1000 mL

## 2022-10-12 MED ORDER — PANTOPRAZOLE SODIUM 40 MG PO TBEC
40.0000 mg | DELAYED_RELEASE_TABLET | Freq: Every day | ORAL | Status: DC
  Administered 2022-10-12 – 2022-10-13 (×2): 40 mg via ORAL
  Filled 2022-10-12 (×2): qty 1

## 2022-10-12 MED ORDER — ORAL CARE MOUTH RINSE: 15.0000 mL | Freq: Once | OROMUCOSAL | Status: AC

## 2022-10-12 MED ORDER — HYDROMORPHONE HCL 1 MG/ML IJ SOLN
0.5000 mg | INTRAMUSCULAR | Status: DC | PRN
  Administered 2022-10-12: 1 mg via INTRAVENOUS
  Filled 2022-10-12: qty 1

## 2022-10-12 MED ORDER — LIDOCAINE 2% (20 MG/ML) 5 ML SYRINGE
INTRAMUSCULAR | Status: DC | PRN
  Administered 2022-10-12: 100 mg via INTRAVENOUS

## 2022-10-12 MED ORDER — ALPRAZOLAM 0.5 MG PO TABS: 0.5000 mg | ORAL_TABLET | Freq: Every day | ORAL | Status: DC | PRN

## 2022-10-12 MED ORDER — MENTHOL 3 MG MT LOZG: 1.0000 | LOZENGE | OROMUCOSAL | Status: DC | PRN

## 2022-10-12 MED ORDER — BUPIVACAINE-EPINEPHRINE (PF) 0.5% -1:200000 IJ SOLN
INTRAMUSCULAR | Status: DC | PRN
  Administered 2022-10-12: 30 mL via PERINEURAL

## 2022-10-12 MED ORDER — DEXAMETHASONE SODIUM PHOSPHATE 10 MG/ML IJ SOLN
INTRAMUSCULAR | Status: DC | PRN
  Administered 2022-10-12: 10 mg via INTRAVENOUS

## 2022-10-12 MED ORDER — METHOCARBAMOL 1000 MG/10ML IJ SOLN: 500.0000 mg | Freq: Four times a day (QID) | INTRAVENOUS | Status: DC | PRN

## 2022-10-12 MED ORDER — HYDROCHLOROTHIAZIDE 25 MG PO TABS
25.0000 mg | ORAL_TABLET | Freq: Every day | ORAL | Status: DC
  Administered 2022-10-12 – 2022-10-13 (×2): 25 mg via ORAL
  Filled 2022-10-12 (×2): qty 1

## 2022-10-12 MED ORDER — PROPOFOL 1000 MG/100ML IV EMUL
INTRAVENOUS | Status: AC
  Filled 2022-10-12: qty 100

## 2022-10-12 MED ORDER — CHLORHEXIDINE GLUCONATE 0.12 % MT SOLN
15.0000 mL | Freq: Once | OROMUCOSAL | Status: AC
  Administered 2022-10-12: 15 mL via OROMUCOSAL

## 2022-10-12 MED ORDER — PHENOL 1.4 % MT LIQD: 1.0000 | OROMUCOSAL | Status: DC | PRN

## 2022-10-12 MED ORDER — 0.9 % SODIUM CHLORIDE (POUR BTL) OPTIME
TOPICAL | Status: DC | PRN
  Administered 2022-10-12: 1000 mL

## 2022-10-12 MED ORDER — METHOCARBAMOL 500 MG PO TABS
500.0000 mg | ORAL_TABLET | Freq: Four times a day (QID) | ORAL | Status: DC | PRN
  Administered 2022-10-12 – 2022-10-13 (×3): 500 mg via ORAL
  Filled 2022-10-12 (×3): qty 1

## 2022-10-12 MED ORDER — ONDANSETRON HCL 4 MG/2ML IJ SOLN
INTRAMUSCULAR | Status: DC | PRN
  Administered 2022-10-12: 4 mg via INTRAVENOUS

## 2022-10-12 MED ORDER — OXYCODONE HCL 5 MG PO TABS
5.0000 mg | ORAL_TABLET | ORAL | Status: DC | PRN
  Administered 2022-10-12 – 2022-10-13 (×3): 10 mg via ORAL
  Filled 2022-10-12 (×3): qty 2

## 2022-10-12 MED ORDER — OXYCODONE HCL 5 MG/5ML PO SOLN: 5.0000 mg | Freq: Once | ORAL | Status: DC | PRN

## 2022-10-12 MED ORDER — ACETAMINOPHEN 10 MG/ML IV SOLN: 1000.0000 mg | Freq: Once | INTRAVENOUS | Status: DC | PRN

## 2022-10-12 MED ORDER — POVIDONE-IODINE 10 % EX SWAB
2.0000 | Freq: Once | CUTANEOUS | Status: AC
  Administered 2022-10-12: 2 via TOPICAL

## 2022-10-12 MED ORDER — DOCUSATE SODIUM 100 MG PO CAPS
100.0000 mg | ORAL_CAPSULE | Freq: Two times a day (BID) | ORAL | Status: DC
  Administered 2022-10-12 – 2022-10-13 (×2): 100 mg via ORAL
  Filled 2022-10-12 (×2): qty 1

## 2022-10-12 MED ORDER — TRANEXAMIC ACID-NACL 1000-0.7 MG/100ML-% IV SOLN
1000.0000 mg | INTRAVENOUS | Status: AC
  Administered 2022-10-12: 1000 mg via INTRAVENOUS
  Filled 2022-10-12: qty 100

## 2022-10-12 MED ORDER — PHENYLEPHRINE 80 MCG/ML (10ML) SYRINGE FOR IV PUSH (FOR BLOOD PRESSURE SUPPORT)
PREFILLED_SYRINGE | INTRAVENOUS | Status: AC
  Filled 2022-10-12: qty 10

## 2022-10-12 MED ORDER — FENTANYL CITRATE PF 50 MCG/ML IJ SOSY
50.0000 ug | PREFILLED_SYRINGE | INTRAMUSCULAR | Status: DC
  Filled 2022-10-12: qty 2

## 2022-10-12 MED ORDER — BUPIVACAINE IN DEXTROSE 0.75-8.25 % IT SOLN
INTRATHECAL | Status: DC | PRN
  Administered 2022-10-12: 1.7 mL via INTRATHECAL

## 2022-10-12 MED ORDER — ONDANSETRON HCL 4 MG/2ML IJ SOLN: 4.0000 mg | Freq: Four times a day (QID) | INTRAMUSCULAR | Status: DC | PRN

## 2022-10-12 MED ORDER — HYDROMORPHONE HCL 2 MG PO TABS
2.0000 mg | ORAL_TABLET | ORAL | Status: DC | PRN
  Administered 2022-10-13 (×3): 2 mg via ORAL
  Filled 2022-10-12 (×3): qty 1

## 2022-10-12 MED ORDER — AMISULPRIDE (ANTIEMETIC) 5 MG/2ML IV SOLN: 10.0000 mg | Freq: Once | INTRAVENOUS | Status: DC | PRN

## 2022-10-12 MED ORDER — CEFAZOLIN SODIUM-DEXTROSE 2-4 GM/100ML-% IV SOLN
2.0000 g | Freq: Four times a day (QID) | INTRAVENOUS | Status: AC
  Administered 2022-10-12 (×2): 2 g via INTRAVENOUS
  Filled 2022-10-12 (×2): qty 100

## 2022-10-12 MED ORDER — CEFAZOLIN IN SODIUM CHLORIDE 3-0.9 GM/100ML-% IV SOLN
3.0000 g | INTRAVENOUS | Status: AC
  Administered 2022-10-12: 3 g via INTRAVENOUS
  Filled 2022-10-12: qty 100

## 2022-10-12 MED ORDER — LIDOCAINE HCL (PF) 2 % IJ SOLN
INTRAMUSCULAR | Status: AC
  Filled 2022-10-12: qty 5

## 2022-10-12 MED ORDER — FENTANYL CITRATE PF 50 MCG/ML IJ SOSY: 25.0000 ug | PREFILLED_SYRINGE | INTRAMUSCULAR | Status: DC | PRN

## 2022-10-12 MED ORDER — BUPIVACAINE-EPINEPHRINE 0.25% -1:200000 IJ SOLN
INTRAMUSCULAR | Status: DC | PRN
  Administered 2022-10-12: 30 mL

## 2022-10-12 MED ORDER — PROMETHAZINE HCL 25 MG/ML IJ SOLN: 6.2500 mg | INTRAMUSCULAR | Status: DC | PRN

## 2022-10-12 MED ORDER — PROPOFOL 10 MG/ML IV BOLUS
INTRAVENOUS | Status: DC | PRN
  Administered 2022-10-12 (×4): 20 mg via INTRAVENOUS

## 2022-10-12 MED ORDER — LISINOPRIL-HYDROCHLOROTHIAZIDE 20-25 MG PO TABS: 1.0000 | ORAL_TABLET | Freq: Every day | ORAL | Status: DC

## 2022-10-12 MED ORDER — PROPOFOL 10 MG/ML IV BOLUS
INTRAVENOUS | Status: AC
  Filled 2022-10-12: qty 20

## 2022-10-12 MED ORDER — METOCLOPRAMIDE HCL 5 MG PO TABS: 5.0000 mg | ORAL_TABLET | Freq: Three times a day (TID) | ORAL | Status: DC | PRN

## 2022-10-12 MED ORDER — LISINOPRIL 20 MG PO TABS
20.0000 mg | ORAL_TABLET | Freq: Every day | ORAL | Status: DC
  Administered 2022-10-12 – 2022-10-13 (×2): 20 mg via ORAL
  Filled 2022-10-12 (×2): qty 1

## 2022-10-12 MED ORDER — ACETAMINOPHEN 325 MG PO TABS: 325.0000 mg | ORAL_TABLET | Freq: Four times a day (QID) | ORAL | Status: DC | PRN

## 2022-10-12 MED ORDER — PHENYLEPHRINE 80 MCG/ML (10ML) SYRINGE FOR IV PUSH (FOR BLOOD PRESSURE SUPPORT)
PREFILLED_SYRINGE | INTRAVENOUS | Status: DC | PRN
  Administered 2022-10-12: 160 ug via INTRAVENOUS

## 2022-10-12 MED ORDER — ONDANSETRON HCL 4 MG PO TABS: 4.0000 mg | ORAL_TABLET | Freq: Four times a day (QID) | ORAL | Status: DC | PRN

## 2022-10-12 MED ORDER — DIPHENHYDRAMINE HCL 12.5 MG/5ML PO ELIX: 12.5000 mg | ORAL_SOLUTION | ORAL | Status: DC | PRN

## 2022-10-12 MED ORDER — DEXAMETHASONE SODIUM PHOSPHATE 10 MG/ML IJ SOLN
INTRAMUSCULAR | Status: AC
  Filled 2022-10-12: qty 1

## 2022-10-12 MED ORDER — VITAMIN B-12 1000 MCG PO TABS
1000.0000 ug | ORAL_TABLET | Freq: Every day | ORAL | Status: DC
  Administered 2022-10-13: 1000 ug via ORAL
  Filled 2022-10-12: qty 1

## 2022-10-12 MED ORDER — ASPIRIN 325 MG PO TBEC
325.0000 mg | DELAYED_RELEASE_TABLET | Freq: Two times a day (BID) | ORAL | Status: DC
  Administered 2022-10-12 – 2022-10-13 (×2): 325 mg via ORAL
  Filled 2022-10-12 (×2): qty 1

## 2022-10-12 SURGICAL SUPPLY — 58 items
BAG COUNTER SPONGE SURGICOUNT (BAG) IMPLANT
BAG ZIPLOCK 12X15 (MISCELLANEOUS) ×1 IMPLANT
BENZOIN TINCTURE PRP APPL 2/3 (GAUZE/BANDAGES/DRESSINGS) IMPLANT
BLADE SAG 18X100X1.27 (BLADE) ×1 IMPLANT
BLADE SURG SZ10 CARB STEEL (BLADE) ×2 IMPLANT
BNDG ELASTIC 6X5.8 VLCR STR LF (GAUZE/BANDAGES/DRESSINGS) ×2 IMPLANT
BOWL SMART MIX CTS (DISPOSABLE) IMPLANT
COMP FEM STD PS KNEE 7 RT (Joint) ×1 IMPLANT
COMP PATELLAR 10X35 METAL (Joint) ×1 IMPLANT
COMP TIB PS KNEE 0D E RT (Joint) ×1 IMPLANT
COMPONENT FEM STD PS KNEE 7 RT (Joint) IMPLANT
COMPONENT PATELLAR 10X35 METAL (Joint) IMPLANT
COMPONET TIB PS KNEE 0D E RT (Joint) IMPLANT
COOLER ICEMAN CLASSIC (MISCELLANEOUS) ×1 IMPLANT
COVER SURGICAL LIGHT HANDLE (MISCELLANEOUS) ×1 IMPLANT
CUFF TOURN SGL QUICK 34 (TOURNIQUET CUFF) ×1
CUFF TRNQT CYL 34X4.125X (TOURNIQUET CUFF) ×1 IMPLANT
DRAPE INCISE IOBAN 66X45 STRL (DRAPES) ×1 IMPLANT
DRAPE U-SHAPE 47X51 STRL (DRAPES) ×1 IMPLANT
DURAPREP 26ML APPLICATOR (WOUND CARE) ×1 IMPLANT
ELECT BLADE TIP CTD 4 INCH (ELECTRODE) ×1 IMPLANT
ELECT REM PT RETURN 15FT ADLT (MISCELLANEOUS) ×1 IMPLANT
GAUZE PAD ABD 7.5X8 STRL (GAUZE/BANDAGES/DRESSINGS) IMPLANT
GAUZE PAD ABD 8X10 STRL (GAUZE/BANDAGES/DRESSINGS) ×2 IMPLANT
GAUZE SPONGE 4X4 12PLY STRL (GAUZE/BANDAGES/DRESSINGS) ×1 IMPLANT
GAUZE XEROFORM 1X8 LF (GAUZE/BANDAGES/DRESSINGS) IMPLANT
GLOVE BIO SURGEON STRL SZ7.5 (GLOVE) ×1 IMPLANT
GLOVE BIOGEL PI IND STRL 8 (GLOVE) ×2 IMPLANT
GLOVE ECLIPSE 8.0 STRL XLNG CF (GLOVE) ×1 IMPLANT
GOWN STRL REUS W/ TWL XL LVL3 (GOWN DISPOSABLE) ×2 IMPLANT
GOWN STRL REUS W/TWL XL LVL3 (GOWN DISPOSABLE) ×2
HANDPIECE INTERPULSE COAX TIP (DISPOSABLE) ×1
HDLS TROCR DRIL PIN KNEE 75 (PIN) ×1
HOLDER FOLEY CATH W/STRAP (MISCELLANEOUS) IMPLANT
IMMOBILIZER KNEE 20 (SOFTGOODS) ×1
IMMOBILIZER KNEE 20 THIGH 36 (SOFTGOODS) ×1 IMPLANT
INSERT TIB ARTISURF SZ6-7 (Insert) IMPLANT
KIT TURNOVER KIT A (KITS) IMPLANT
NS IRRIG 1000ML POUR BTL (IV SOLUTION) ×1 IMPLANT
PACK TOTAL KNEE CUSTOM (KITS) ×1 IMPLANT
PAD COLD SHLDR WRAP-ON (PAD) ×1 IMPLANT
PADDING CAST COTTON 6X4 STRL (CAST SUPPLIES) ×2 IMPLANT
PIN DRILL HDLS TROCAR 75 4PK (PIN) IMPLANT
PROTECTOR NERVE ULNAR (MISCELLANEOUS) ×1 IMPLANT
SCREW FEMALE HEX FIX 25X2.5 (ORTHOPEDIC DISPOSABLE SUPPLIES) IMPLANT
SET HNDPC FAN SPRY TIP SCT (DISPOSABLE) ×1 IMPLANT
SET PAD KNEE POSITIONER (MISCELLANEOUS) ×1 IMPLANT
SPIKE FLUID TRANSFER (MISCELLANEOUS) IMPLANT
STAPLER VISISTAT 35W (STAPLE) IMPLANT
STRIP CLOSURE SKIN 1/2X4 (GAUZE/BANDAGES/DRESSINGS) IMPLANT
SUT MNCRL AB 4-0 PS2 18 (SUTURE) IMPLANT
SUT VIC AB 0 CT1 27 (SUTURE) ×1
SUT VIC AB 0 CT1 27XBRD ANTBC (SUTURE) ×1 IMPLANT
SUT VIC AB 1 CT1 36 (SUTURE) ×2 IMPLANT
SUT VIC AB 2-0 CT1 27 (SUTURE) ×2
SUT VIC AB 2-0 CT1 TAPERPNT 27 (SUTURE) ×2 IMPLANT
TRAY FOLEY MTR SLVR 16FR STAT (SET/KITS/TRAYS/PACK) IMPLANT
WATER STERILE IRR 1000ML POUR (IV SOLUTION) ×2 IMPLANT

## 2022-10-12 NOTE — Anesthesia Postprocedure Evaluation (Signed)
Anesthesia Post Note  Patient: ELENO WEIMAR  Procedure(s) Performed: RIGHT TOTAL KNEE ARTHROPLASTY (Right: Knee)     Patient location during evaluation: PACU Anesthesia Type: Regional and Spinal Level of consciousness: awake Pain management: pain level controlled Vital Signs Assessment: post-procedure vital signs reviewed and stable Respiratory status: spontaneous breathing, nonlabored ventilation, respiratory function stable and patient connected to nasal cannula oxygen Cardiovascular status: stable and blood pressure returned to baseline Postop Assessment: no apparent nausea or vomiting Anesthetic complications: no   No notable events documented.  Last Vitals:  Vitals:   10/12/22 1424 10/12/22 1738  BP: (!) 149/90 139/89  Pulse: (!) 53 66  Resp: 17 16  Temp: (!) 36.4 C 36.5 C  SpO2: 95% 91%    Last Pain:  Vitals:   10/12/22 1641  TempSrc:   PainSc: 6                  Varshini Arrants P Huyen Perazzo

## 2022-10-12 NOTE — Anesthesia Procedure Notes (Signed)
Spinal  Patient location during procedure: OR Start time: 10/12/2022 10:20 AM End time: 10/12/2022 10:25 AM Reason for block: surgical anesthesia Staffing Performed: anesthesiologist  Anesthesiologist: Murvin Natal, MD Performed by: Murvin Natal, MD Authorized by: Murvin Natal, MD   Preanesthetic Checklist Completed: patient identified, IV checked, risks and benefits discussed, surgical consent, monitors and equipment checked, pre-op evaluation and timeout performed Spinal Block Patient position: sitting Prep: DuraPrep Patient monitoring: cardiac monitor, continuous pulse ox and blood pressure Approach: midline Location: L4-5 Injection technique: single-shot Needle Needle type: Pencan  Needle gauge: 24 G Needle length: 9 cm Assessment Sensory level: T10 Events: CSF return Additional Notes Functioning IV was confirmed and monitors were applied. Sterile prep and drape, including hand hygiene and sterile gloves were used. The patient was positioned and the spine was prepped. The skin was anesthetized with lidocaine.  Free flow of clear CSF was obtained prior to injecting local anesthetic into the CSF.  The spinal needle aspirated freely following injection.  The needle was carefully withdrawn.  The patient tolerated the procedure well.

## 2022-10-12 NOTE — Transfer of Care (Signed)
Immediate Anesthesia Transfer of Care Note  Patient: Charles Morales  Procedure(s) Performed: RIGHT TOTAL KNEE ARTHROPLASTY (Right: Knee)  Patient Location: PACU  Anesthesia Type:Spinal  Level of Consciousness: drowsy  Airway & Oxygen Therapy: Patient Spontanous Breathing and Patient connected to face mask oxygen  Post-op Assessment: Report given to RN and Post -op Vital signs reviewed and stable  Post vital signs: Reviewed and stable  Last Vitals:  Vitals Value Taken Time  BP 104/66 10/12/22 1226  Temp    Pulse 62 10/12/22 1228  Resp 18 10/12/22 1228  SpO2 98 % 10/12/22 1228  Vitals shown include unvalidated device data.  Last Pain:  Vitals:   10/12/22 0940  TempSrc:   PainSc: (P) 0-No pain         Complications: No notable events documented.

## 2022-10-12 NOTE — Interval H&P Note (Signed)
History and Physical Interval Note: The patient understands that he is here today for right knee replacement to treat his right knee severe osteoarthritis.  There has been no acute or interval changes medical status.  See H&P.  The risks and benefits of surgery been explained in detail and informed consent is obtained.  The right operative knee has been marked.  10/12/2022 9:00 AM  Charles Morales  has presented today for surgery, with the diagnosis of osteoarthritis / degenerative joint disease right knee.  The various methods of treatment have been discussed with the patient and family. After consideration of risks, benefits and other options for treatment, the patient has consented to  Procedure(s): RIGHT TOTAL KNEE ARTHROPLASTY (Right) as a surgical intervention.  The patient's history has been reviewed, patient examined, no change in status, stable for surgery.  I have reviewed the patient's chart and labs.  Questions were answered to the patient's satisfaction.     Mcarthur Rossetti

## 2022-10-12 NOTE — Anesthesia Preprocedure Evaluation (Addendum)
Anesthesia Evaluation  Patient identified by MRN, date of birth, ID band Patient awake    Reviewed: Allergy & Precautions, NPO status , Patient's Chart, lab work & pertinent test results  Airway Mallampati: II  TM Distance: >3 FB Neck ROM: Full    Dental no notable dental hx.    Pulmonary sleep apnea and Continuous Positive Airway Pressure Ventilation , former smoker,    Pulmonary exam normal        Cardiovascular hypertension, Pt. on medications Normal cardiovascular exam  ECG: SR, rate 64   Neuro/Psych negative neurological ROS  negative psych ROS   GI/Hepatic Neg liver ROS, GERD  Medicated and Controlled,  Endo/Other  Morbid obesity  Renal/GU negative Renal ROS     Musculoskeletal  (+) Arthritis ,   Abdominal (+) + obese,   Peds  Hematology negative hematology ROS (+)   Anesthesia Other Findings Osteoarthritis Degenerative joint disease right knee  Reproductive/Obstetrics                            Anesthesia Physical Anesthesia Plan  ASA: 3  Anesthesia Plan: Regional and Spinal   Post-op Pain Management: Regional block*   Induction: Intravenous  PONV Risk Score and Plan: 1 and Ondansetron, Dexamethasone, Propofol infusion, Midazolam and Treatment may vary due to age or medical condition  Airway Management Planned: Simple Face Mask  Additional Equipment:   Intra-op Plan:   Post-operative Plan:   Informed Consent: I have reviewed the patients History and Physical, chart, labs and discussed the procedure including the risks, benefits and alternatives for the proposed anesthesia with the patient or authorized representative who has indicated his/her understanding and acceptance.     Dental advisory given  Plan Discussed with: CRNA  Anesthesia Plan Comments:         Anesthesia Quick Evaluation

## 2022-10-12 NOTE — Anesthesia Procedure Notes (Signed)
Anesthesia Regional Block: Adductor canal block   Pre-Anesthetic Checklist: , timeout performed,  Correct Patient, Correct Site, Correct Laterality,  Correct Procedure,, site marked,  Risks and benefits discussed,  Surgical consent,  Pre-op evaluation,  At surgeon's request and post-op pain management  Laterality: Right  Prep: chloraprep       Needles:  Injection technique: Single-shot  Needle Type: Echogenic Stimulator Needle     Needle Length: 10cm  Needle Gauge: 20     Additional Needles:   Procedures:,,,, ultrasound used (permanent image in chart),,    Narrative:  Start time: 10/12/2022 9:10 AM End time: 10/12/2022 9:20 AM Injection made incrementally with aspirations every 5 mL.  Performed by: Personally  Anesthesiologist: Murvin Natal, MD  Additional Notes: Functioning IV was confirmed and monitors were applied. A time-out was performed. Hand hygiene and sterile gloves were used. The thigh was placed in a frog-leg position and prepped in a sterile fashion. A 155m 20ga Bbraun echogenic stimulator needle was placed using ultrasound guidance.  Negative aspiration and negative test dose prior to incremental administration of local anesthetic. The patient tolerated the procedure well.

## 2022-10-12 NOTE — Evaluation (Signed)
Physical Therapy Evaluation Patient Details Name: Charles Morales MRN: [id number] DOB: 1961/08/07 Today's Date: 10/12/2022  History of Present Illness  Pt s/p R TKR and with hx of morbid obesity  Clinical Impression  Pt s/p R TKR and presents with decreased R LE strength/ROM, post op pain and obesity limiting functional mobility. Pt should progress to dc home with family assist and follow up HHPT.     Recommendations for follow up therapy are one component of a multi-disciplinary discharge planning process, led by the attending physician.  Recommendations may be updated based on patient status, additional functional criteria and insurance authorization.  Follow Up Recommendations Follow physician's recommendations for discharge plan and follow up therapies      Assistance Recommended at Discharge Intermittent Supervision/Assistance  Patient can return home with the following  A little help with walking and/or transfers;A little help with bathing/dressing/bathroom;Assistance with cooking/housework;Assist for transportation;Help with stairs or ramp for entrance    Equipment Recommendations None recommended by PT  Recommendations for Other Services       Functional Status Assessment Patient has had a recent decline in their functional status and demonstrates the ability to make significant improvements in function in a reasonable and predictable amount of time.     Precautions / Restrictions Precautions Precautions: Knee;Fall Required Braces or Orthoses: Knee Immobilizer - Right Knee Immobilizer - Right: Discontinue once straight leg raise with < 10 degree lag Restrictions Weight Bearing Restrictions: No RLE Weight Bearing: Weight bearing as tolerated      Mobility  Bed Mobility Overal bed mobility: Needs Assistance Bed Mobility: Supine to Sit, Sit to Supine     Supine to sit: Min assist, Mod assist Sit to supine: Min assist, Mod assist   General bed mobility comments:  cues for sequence and use of L LE to self assist    Transfers Overall transfer level: Needs assistance Equipment used: Rolling walker (2 wheels) Transfers: Sit to/from Stand Sit to Stand: Min assist, From elevated surface           General transfer comment: cues for LE management and use of UEs to self assist    Ambulation/Gait Ambulation/Gait assistance: Min assist Gait Distance (Feet): 58 Feet Assistive device: Rolling walker (2 wheels) Gait Pattern/deviations: Step-to pattern, Decreased step length - right, Decreased step length - left, Shuffle, Antalgic, Trunk flexed Gait velocity: decr     General Gait Details: cues for sequence, posture and position from [employer]            Wheelchair Mobility    Modified Rankin (Stroke Patients Only)       Balance Overall balance assessment: Needs assistance Sitting-balance support: No upper extremity supported, Feet supported Sitting balance-Leahy Scale: Good     Standing balance support: Bilateral upper extremity supported Standing balance-Leahy Scale: Poor                               Pertinent Vitals/Pain Pain Assessment Pain Assessment: 0-10 Pain Score: 7  Pain Location: R knee Pain Descriptors / Indicators: Aching, Grimacing, Sore Pain Intervention(s): Limited activity within patient's tolerance, Monitored during session, Premedicated before session, Ice applied    Home Living Family/patient expects to be discharged to:: Private residence Living Arrangements: Spouse/significant other Available Help at Discharge: Family;Available 24 hours/day Type of Home: House Home Access: Stairs to enter Entrance Stairs-Rails: Right Entrance Stairs-Number of Steps: 4 Alternate Level Stairs-Number of Steps: ramp vs 1 step Home  Layout: Two level Home Equipment: Conservation officer, nature (2 wheels);Cane - single point      Prior Function Prior Level of Function : Independent/Modified Independent                      Hand Dominance        Extremity/Trunk Assessment   Upper Extremity Assessment Upper Extremity Assessment: Overall WFL for tasks assessed    Lower Extremity Assessment Lower Extremity Assessment: RLE deficits/detail       Communication   Communication: No difficulties  Cognition Arousal/Alertness: Awake/alert Behavior During Therapy: WFL for tasks assessed/performed Overall Cognitive Status: Within Functional Limits for tasks assessed                                          General Comments      Exercises Total Joint Exercises Ankle Circles/Pumps: AROM, Both, 15 reps, Supine   Assessment/Plan    PT Assessment Patient needs continued PT services  PT Problem List Decreased strength;Decreased range of motion;Decreased activity tolerance;Decreased balance;Decreased mobility;Decreased knowledge of use of DME;Obesity;Pain       PT Treatment Interventions DME instruction;Gait training;Stair training;Functional mobility training;Therapeutic activities;Therapeutic exercise;Patient/family education    PT Goals (Current goals can be found in the Care Plan section)  Acute Rehab PT Goals Patient Stated Goal: Regain IND PT Goal Formulation: With patient Time For Goal Achievement: 10/26/22 Potential to Achieve Goals: Good    Frequency 7X/week     Co-evaluation               AM-PAC PT "6 Clicks" Mobility  Outcome Measure Help needed turning from your back to your side while in a flat bed without using bedrails?: A Little Help needed moving from lying on your back to sitting on the side of a flat bed without using bedrails?: A Lot Help needed moving to and from a bed to a chair (including a wheelchair)?: A Little Help needed standing up from a chair using your arms (e.g., wheelchair or bedside chair)?: A Little Help needed to walk in hospital room?: A Little Help needed climbing 3-5 steps with a railing? : A Lot 6 Click Score: 16     End of Session Equipment Utilized During Treatment: Gait belt;Right knee immobilizer Activity Tolerance: Patient tolerated treatment well Patient left: in bed;with call bell/phone within reach;with bed alarm set;with family/visitor present Nurse Communication: Mobility status PT Visit Diagnosis: Difficulty in walking, not elsewhere classified (R26.2)    Time: 2536-6440 PT Time Calculation (min) (ACUTE ONLY): 26 min   Charges:   PT Evaluation $PT Eval Low Complexity: 1 Low PT Treatments $Gait Training: 8-22 mins        [place] Pager [phone] Office [phone]   Kitti Mcclish 10/12/2022, 5:58 PM

## 2022-10-12 NOTE — Anesthesia Procedure Notes (Addendum)
Date/Time: 10/12/2022 10:13 AM  Performed by: Sharlette Dense, CRNAOxygen Delivery Method: Simple face mask

## 2022-10-12 NOTE — Plan of Care (Signed)
Discussed and reviewed plan of care with patient and family

## 2022-10-12 NOTE — Op Note (Signed)
Operative Note  Date of operation: 10/12/2022 Preoperative diagnosis: Right knee degenerative joint disease and osteoarthritis Postoperative diagnosis: Same  Procedure: Right press-fit total knee arthroplasty  Implants: Biomet Zimmer persona press-fit knee system with size 7 CR standard right femur, size E right tibial tray, 12 mm thickness right medial congruent polythene insert, 35 mm press-fit patella button  Surgeon: Lind Guest. Ninfa Linden, MD Assistant: Benita Stabile, PA-C  Anesthesia: #1 right lower extremity regional block, #2 spinal, #3 local Tourniquet time: Under 1 hour Antibiotics: 3 g IV Ancef Blood loss: Less than 366 cc Complications: None  Indications: The patient is a 61 year old gentleman with severe end-stage arthritis involving his right knee.  He has x-ray showing bone-on-bone wear throughout the knee with significant varus malalignment.  His right knee pain is daily and it is 10 out of 10.  It is detrimentally affecting his mobility, his quality of life and his activities day living.  He has been on a weight loss journey.  He used to weigh close to 390 pounds he is down to 330 pounds.  He continues to lose weight and we feel that if he has this knee replaced at this point that he will continue on this weight loss journey.  Given his significant obesity, he understands there is certainly heightened risk of implant failure as well as nerve vessel injury, infection, fracture, DVT and wound healing issues.  He understands her goals are hopefully decrease pain, improve mobility and improve quality of life.  Procedure description: After informed consent was obtained and the appropriate right knee was marked, anesthesia obtained a right lower extremity regional block in the holding room and the patient was brought to the operating room and set up on the operating table where spinal anesthesia was obtained.  He was then laid in supine position on the operating table and a Foley  catheter was placed.  A nonsterile tourniquet placed on his upper right thigh and his right thigh, knee, leg, ankle and foot were prepped and draped with DuraPrep and sterile drapes including a sterile stockinette.  A timeout was called and he was identified as the correct patient and the correct right knee.  We then used Esmarch to wrap out the leg and tourniquet was plated to 3 and a millimeter pressure.  I then made a direct midline incision over the patella and get this proximally distally.  I dissected out the knee joint carried out a medial parapatellar arthrotomy finding a moderate joint effusion.  With the knee in a flexed position we found significant cartilage wear throughout the knee.  Osteophytes were removed from all 3 compartments including the patella.  We remove remnants of the ACL as well as medial lateral meniscus.  We then used extramedullary cutting guide for making the proximal tibia cut correction for varus and valgus and 3 degree slope.  We made this cut to take 2 mm off the low side and then we backed this down 2 more millimeters.  We then went back to the femur and used a distal femoral cutting guide that was intramedullary setting this for a right knee at 5 degrees external rotated for 10 mm distal femoral cut.  We made the cut without difficulty and brought the knee back down to full extension and achieve full extension with a 10 mm block.  We then back to the femur and put a femoral sizing guide based off the epicondylar axis.  Based off of this we chose a size 7 femur.  We put a 4-in-1 cutting block for a size 7 femur and made her anterior posterior cuts followed by our chamfer cuts.  We then impacted the tibia and chose a size E right tibial tray for coverage over the tibial plateau setting the rotation of the tibial tubercle and the femur.  We then made our drill hole and keel punch off of this and prepared for a press-fit tibia and femur based on his hard good quality bone.  With the  size E right tibia we trialed a size 7 right femur and went up to a 12 mm medial congruent right fixed-bearing polythene insert we are pleased with range of motion and stability without insert.  We then made a patella cut and drilled a single hole for a press-fit size 35 patella button.  With all trials rotation the knee we put him through several cycles of motion and we are pleased with range of motion and stability.  We then removed all trial components from the knee and irrigated the knee with normal saline solution.  We dried the knee real well and placed our Marcaine with epinephrine around the arthrotomy.  With the knee in a flexed position we then placed our press-fit Biomet Zimmer persona tibial tray size E for right knee followed by our size 7 right CR standard femur.  We placed our real 12 mm medial congruent polythene insert and press-fit our 35 patella button.  We then put the knee through several cycles of motion and we are pleased with ration and stability.  We then let the tourniquet down and hemostasis was obtained electrocautery.  The arthrotomy was closed with interrupted #1 Vicryl suture followed by 0 Vicryl close the deep tissue and 2-0 Vicryl close subcutaneous tissue.  The skin was closed with staples.  Sterile dressing was applied.  He was taken recovery in stable addition with all final counts being correct and no complications noted.  Benita Stabile PA-C assisted in the entire case and beginning to end his assistance and medically necessary and crucial for soft tissue management and retraction, helping guide implant placement and a layered closure of the wound.

## 2022-10-13 DIAGNOSIS — M1711 Unilateral primary osteoarthritis, right knee: Secondary | ICD-10-CM | POA: Diagnosis not present

## 2022-10-13 LAB — BASIC METABOLIC PANEL
Anion gap: 5 (ref 5–15)
BUN: 13 mg/dL (ref 6–20)
CO2: 29 mmol/L (ref 22–32)
Calcium: 9.2 mg/dL (ref 8.9–10.3)
Chloride: 104 mmol/L (ref 98–111)
Creatinine, Ser: 0.82 mg/dL (ref 0.61–1.24)
GFR, Estimated: >60 mL/min (ref >60)
Glucose, Bld: 152 mg/dL — ABNORMAL HIGH (ref 70–99)
Potassium: 4.3 mmol/L (ref 3.5–5.1)
Sodium: 138 mmol/L (ref 135–145)

## 2022-10-13 LAB — CBC
HCT: 44 % (ref 39.0–52.0)
Hemoglobin: 14.3 g/dL (ref 13.0–17.0)
MCH: 29.6 pg (ref 26.0–34.0)
MCHC: 32.5 g/dL (ref 30.0–36.0)
MCV: 91.1 fL (ref 80.0–100.0)
Platelets: 168 10*3/uL (ref 150–400)
RBC: 4.83 MIL/uL (ref 4.22–5.81)
RDW: 14.9 % (ref 11.5–15.5)
WBC: 10.7 10*3/uL — ABNORMAL HIGH (ref 4.0–10.5)
nRBC: 0 % (ref 0.0–0.2)

## 2022-10-13 MED ORDER — HYDROMORPHONE HCL 4 MG PO TABS: 4.0000 mg | ORAL_TABLET | ORAL | 0 refills | Status: DC | PRN

## 2022-10-13 MED ORDER — METHOCARBAMOL 500 MG PO TABS: 500.0000 mg | ORAL_TABLET | Freq: Four times a day (QID) | ORAL | 1 refills | Status: DC | PRN

## 2022-10-13 MED ORDER — ASPIRIN 325 MG PO TBEC: 325.0000 mg | DELAYED_RELEASE_TABLET | Freq: Two times a day (BID) | ORAL | 0 refills | Status: DC

## 2022-10-13 NOTE — TOC Transition Note (Signed)
Transition of Care [facility]) - CM/SW Discharge Note   Patient Details  Name: Charles Morales MRN: [id number] Date of Birth: 1961-08-22  [facility] CM/SW Contact:  Lennart Pall, LCSW Phone Number: 10/13/2022, 10:34 AM   Clinical Narrative:    Met with pt and confirming he has needed DME at home. HHPT prearranged with [facility].  No further TOC needs.   Final next level of care: [place] Barriers to Discharge: No Barriers Identified   Patient Goals and CMS Choice Patient states their goals for this hospitalization and ongoing recovery are:: return home      Discharge Placement                       Discharge Plan and Services                DME Arranged: N/A DME Agency: NA       HH Arranged: PT [place] Agency: [place]        Social Determinants of Health (SDOH) Interventions     Readmission Risk Interventions     No data to display

## 2022-10-13 NOTE — Progress Notes (Signed)
Subjective: 1 Day Post-Op Procedure(s) (LRB): RIGHT TOTAL KNEE ARTHROPLASTY (Right) Patient reports pain as moderate.    Objective: Vital signs in last 24 hours: Temp:  [97.5 F (36.4 C)-98.7 F (37.1 C)] 97.9 F (36.6 C) (10/14 0942) Pulse Rate:  [51-71] 71 (10/14 0942) Resp:  [10-18] 17 (10/14 0942) BP: (104-149)/(68-91) 144/73 (10/14 0942) SpO2:  [91 %-99 %] 91 % (10/14 0942)  Intake/Output from previous day: 10/13 0701 - 10/14 0700 In: 3720.5 [P.O.:1380; I.V.:2040.5; IV Piggyback:300] Out: 3725 [Urine:3675; Blood:50] Intake/Output this shift: Total I/O In: 840 [P.O.:840] Out: 400 [Urine:400]  Recent Labs    10/13/22 0333  HGB 14.3   Recent Labs    10/13/22 0333  WBC 10.7*  RBC 4.83  HCT 44.0  PLT 168   Recent Labs    10/13/22 0333  NA 138  K 4.3  CL 104  CO2 29  BUN 13  CREATININE 0.82  GLUCOSE 152*  CALCIUM 9.2   No results for input(s): "LABPT", "INR" in the last 72 hours.  Sensation intact distally Intact pulses distally Dorsiflexion/Plantar flexion intact Incision: dressing C/D/I Compartment soft   Assessment/Plan: 1 Day Post-Op Procedure(s) (LRB): RIGHT TOTAL KNEE ARTHROPLASTY (Right) Up with therapy Discharge home with home health      Mcarthur Rossetti 10/13/2022, 10:57 AM

## 2022-10-13 NOTE — Discharge Instructions (Signed)

## 2022-10-13 NOTE — Progress Notes (Signed)
Family states they will pay the rest of Rx cost out of pocket.

## 2022-10-13 NOTE — Progress Notes (Signed)
Physical Therapy Treatment Patient Details Name: Charles Morales MRN: [id number] DOB: Nov 15, 1961 Today's Date: 10/13/2022   History of Present Illness Pt s/p R TKR and with hx of morbid obesity    PT Comments    Pt very motivated and progressing well with mobility including increased distance ambulated and initiated HEP.  Pt should progress to dc home later this date.   Recommendations for follow up therapy are one component of a multi-disciplinary discharge planning process, led by the attending physician.  Recommendations may be updated based on patient status, additional functional criteria and insurance authorization.  Follow Up Recommendations  Follow physician's recommendations for discharge plan and follow up therapies     Assistance Recommended at Discharge Intermittent Supervision/Assistance  Patient can return home with the following A little help with walking and/or transfers;A little help with bathing/dressing/bathroom;Assistance with cooking/housework;Assist for transportation;Help with stairs or ramp for entrance   Equipment Recommendations  None recommended by PT    Recommendations for Other Services       Precautions / Restrictions Precautions Precautions: Knee;Fall Required Braces or Orthoses: Knee Immobilizer - Right Knee Immobilizer - Right: Discontinue once straight leg raise with < 10 degree lag Restrictions Weight Bearing Restrictions: No RLE Weight Bearing: Weight bearing as tolerated     Mobility  Bed Mobility Overal bed mobility: Needs Assistance Bed Mobility: Supine to Sit     Supine to sit: Min assist     General bed mobility comments: cues for sequence and use of L LE to self assist    Transfers Overall transfer level: Needs assistance Equipment used: Rolling walker (2 wheels) Transfers: Sit to/from Stand Sit to Stand: Min assist, Min guard           General transfer comment: cues for LE management and use of UEs to self assist     Ambulation/Gait Ambulation/Gait assistance: Min assist, Min guard Gait Distance (Feet): 110 Feet (and additional 15' from bathroom) Assistive device: Rolling walker (2 wheels) Gait Pattern/deviations: Step-to pattern, Decreased step length - right, Decreased step length - left, Shuffle, Antalgic, Trunk flexed Gait velocity: decr     General Gait Details: cues for sequence, posture and position from [employer]             Wheelchair Mobility    Modified Rankin (Stroke Patients Only)       Balance Overall balance assessment: Needs assistance Sitting-balance support: No upper extremity supported, Feet supported Sitting balance-Leahy Scale: Good     Standing balance support: Single extremity supported Standing balance-Leahy Scale: Poor                              Cognition Arousal/Alertness: Awake/alert Behavior During Therapy: WFL for tasks assessed/performed Overall Cognitive Status: Within Functional Limits for tasks assessed                                          Exercises Total Joint Exercises Ankle Circles/Pumps: AROM, Both, 15 reps, Supine Quad Sets: AROM, Both, 10 reps, Supine Heel Slides: AAROM, Right, 10 reps, Supine Straight Leg Raises: AAROM, Right, 10 reps, Supine    General Comments        Pertinent Vitals/Pain Pain Assessment Pain Assessment: 0-10 Pain Score: 5  Pain Location: R knee Pain Descriptors / Indicators: Aching, Grimacing, Sore Pain Intervention(s): Limited activity within  patient's tolerance, Monitored during session, Premedicated before session, Ice applied    Home Living                          Prior Function            PT Goals (current goals can now be found in the care plan section) Acute Rehab PT Goals Patient Stated Goal: Regain IND PT Goal Formulation: With patient Time For Goal Achievement: 10/26/22 Potential to Achieve Goals: Good Progress towards PT goals:  Progressing toward goals    Frequency    7X/week      PT Plan Current plan remains appropriate    Co-evaluation              AM-PAC PT "6 Clicks" Mobility   Outcome Measure  Help needed turning from your back to your side while in a flat bed without using bedrails?: A Little Help needed moving from lying on your back to sitting on the side of a flat bed without using bedrails?: A Little Help needed moving to and from a bed to a chair (including a wheelchair)?: A Little Help needed standing up from a chair using your arms (e.g., wheelchair or bedside chair)?: A Little Help needed to walk in hospital room?: A Little Help needed climbing 3-5 steps with a railing? : A Lot 6 Click Score: 17    End of Session Equipment Utilized During Treatment: Gait belt;Right knee immobilizer Activity Tolerance: Patient tolerated treatment well Patient left: in chair;with call bell/phone within reach;with chair alarm set;with family/visitor present Nurse Communication: Mobility status PT Visit Diagnosis: Difficulty in walking, not elsewhere classified (R26.2)     Time: 7867-5449 PT Time Calculation (min) (ACUTE ONLY): 46 min  Charges:  $Gait Training: 8-22 mins $Therapeutic Exercise: 8-22 mins                     Debe Coder PT Acute Rehabilitation Services Pager [phone] Office [phone]    Krithik Mapel 10/13/2022, 12:35 PM

## 2022-10-13 NOTE — Discharge Summary (Signed)
Patient ID: Charles Morales MRN: [id number] DOB/AGE: 1961/12/13 61 y.o.  Admit date: 10/12/2022 Discharge date: 10/13/2022  Admission Diagnoses:  Principal Problem:   Unilateral primary osteoarthritis, right knee Active Problems:   Status post total right knee replacement   Discharge Diagnoses:  Same  Past Medical History:  Diagnosis Date   Allergy    Arthritis    right knee   Esophageal reflux    Family history of breast cancer    Family history of genetic disease carrier    sister NBN pathogenic variant   Family history of pancreatic cancer    Fatty liver    HLD (hyperlipidemia)    HTN (hypertension)    Morbid obesity ([place])    Pre-diabetes    Sleep apnea    USES CPAP    Tobacco use disorder    Unspecified sleep apnea     Surgeries: Procedure(s): RIGHT TOTAL KNEE ARTHROPLASTY on 10/12/2022   Consultants:   Discharged Condition: Improved  Hospital Course: ZAKEE Morales is an 61 y.o. male who was admitted 10/12/2022 for operative treatment ofUnilateral primary osteoarthritis, right knee. Patient has severe unremitting pain that affects sleep, daily activities, and work/hobbies. After pre-op clearance the patient was taken to the operating room on 10/12/2022 and underwent  Procedure(s): RIGHT TOTAL KNEE ARTHROPLASTY.    Patient was given perioperative antibiotics:  Anti-infectives (From admission, onward)    Start     Dose/Rate Route Frequency Ordered Stop   10/12/22 1600  ceFAZolin (ANCEF) IVPB 2g/100 mL premix        2 g 200 mL/hr over 30 Minutes Intravenous Every 6 hours 10/12/22 1417 10/12/22 2132   10/12/22 0745  ceFAZolin (ANCEF) IVPB 3g/100 mL premix        3 g 200 mL/hr over 30 Minutes Intravenous On call to O.R. 10/12/22 0737 10/12/22 1030        Patient was given sequential compression devices, early ambulation, and chemoprophylaxis to prevent DVT.  Patient benefited maximally from hospital stay and there were no complications.    Recent  vital signs: Patient Vitals for the past 24 hrs:  BP Temp Temp src Pulse Resp SpO2  10/13/22 0942 (!) 144/73 97.9 F (36.6 C) Oral 71 17 91 %  10/13/22 0818 (!) 131/91 -- -- -- -- --  10/13/22 0457 113/68 98.6 F (37 C) Oral 62 14 95 %  10/13/22 0140 124/74 98.7 F (37.1 C) Oral 63 14 95 %  10/12/22 2214 124/76 97.9 F (36.6 C) Oral 64 14 92 %  10/12/22 1738 139/89 97.7 F (36.5 C) -- 66 16 91 %  10/12/22 1424 (!) 149/90 (!) 97.5 F (36.4 C) -- (!) 53 17 95 %  10/12/22 1330 128/76 -- -- (!) 53 15 97 %  10/12/22 1315 126/76 97.7 F (36.5 C) -- (!) 51 10 99 %  10/12/22 1300 129/73 -- -- (!) 54 18 95 %  10/12/22 1245 119/73 -- -- (!) 53 12 97 %  10/12/22 1230 104/70 97.6 F (36.4 C) -- 63 12 97 %     Recent laboratory studies:  Recent Labs    10/13/22 0333  WBC 10.7*  HGB 14.3  HCT 44.0  PLT 168  NA 138  K 4.3  CL 104  CO2 29  BUN 13  CREATININE 0.82  GLUCOSE 152*  CALCIUM 9.2     Discharge Medications:   Allergies as of 10/13/2022   No Known Allergies      Medication List  STOP taking these medications    aspirin 325 MG tablet Replaced by: aspirin EC 325 MG tablet   naproxen sodium 220 MG tablet Commonly known as: ALEVE       TAKE these medications    ALPRAZolam 0.5 MG tablet Commonly known as: Xanax Take 1 tablet (0.5 mg total) by mouth daily as needed for anxiety.   aspirin EC 325 MG tablet Take 1 tablet (325 mg total) by mouth 2 (two) times daily. Replaces: aspirin 325 MG tablet   cetirizine 10 MG tablet Commonly known as: ZYRTEC Take 10 mg by mouth daily.   cyanocobalamin 1000 MCG tablet Commonly known as: VITAMIN B12 Take 1,000 mcg by mouth daily.   diclofenac Sodium 1 % Gel Commonly known as: VOLTAREN Apply 1 Application topically 4 (four) times daily as needed (pain).   Fish Oil 1200 MG Cpdr Take 2,400 mg by mouth 2 (two) times daily.   GLUCOSAMINE-CHONDROITIN PO Take 1 tablet by mouth 2 (two) times daily.    HYDROmorphone 4 MG tablet Commonly known as: Dilaudid Take 1 tablet (4 mg total) by mouth every 4 (four) hours as needed for severe pain.   lisinopril-hydrochlorothiazide 20-25 MG tablet Commonly known as: ZESTORETIC Take 1 tablet by mouth daily.   methocarbamol 500 MG tablet Commonly known as: ROBAXIN Take 1 tablet (500 mg total) by mouth every 6 (six) hours as needed for muscle spasms.   NON FORMULARY CPAP at night as directed   omeprazole 40 MG capsule Commonly known as: PRILOSEC TAKE 1 CAPSULE (40 MG TOTAL) BY MOUTH DAILY.   rosuvastatin 5 MG tablet Commonly known as: Crestor Take 1 tablet (5 mg total) by mouth daily.   VICKS SINEX NA Place 1 spray into the nose daily as needed (allergies).   ZzzQuil 50 MG/30ML Liqd Generic drug: diphenhydrAMINE HCl (Sleep) Take 50 mg by mouth at bedtime as needed (sleep).               Durable Medical Equipment  (From admission, onward)           Start     Ordered   10/12/22 1418  DME Walker rolling  Once       Question Answer Comment  Walker: With 5 Inch Wheels   Patient needs a walker to treat with the following condition Status post total right knee replacement      10/12/22 1417            Diagnostic Studies: DG Knee Right Port  Result Date: 10/12/2022 CLINICAL DATA:  Status post total knee replacement. EXAM: PORTABLE RIGHT KNEE - 1-2 VIEW COMPARISON:  None Available. FINDINGS: Right knee arthroplasty in expected alignment. No periprosthetic lucency or fracture. Recent postsurgical change includes air and edema in the soft tissues and joint space. Anterior skin staples in place. IMPRESSION: Right knee arthroplasty without immediate postoperative complication. Electronically Signed   By: Keith Rake M.D.   On: 10/12/2022 14:08    Disposition: Discharge disposition: 01-Home or [place]     Charles Rossetti, MD Follow up in 2 week(s).   Specialty:  Orthopedic Surgery Contact information: [place] [address] [place], [place] Follow up.   Specialty: [place] Why: to provide home physical therapy visits Contact information: [place] [address] [phone]  Signed: Mcarthur Morales 10/13/2022, 11:01 AM

## 2022-10-13 NOTE — Progress Notes (Signed)
Spoke to [place] [facility] about pts concern for Rx dilaudid regarding insurance coverage.

## 2022-10-13 NOTE — Progress Notes (Signed)
Physical Therapy Treatment Patient Details Name: Charles Morales MRN: [id number] DOB: 1961-07-01 Today's Date: 10/13/2022   History of Present Illness Pt s/p R TKR and with hx of morbid obesity    PT Comments    Pt continues motivated and progressing well with mobility.  Pt up to ambulate in hall, negotiated stairs, reviewed limited HEP and reviewed KI application.  Pt eager for dc home this date.   Recommendations for follow up therapy are one component of a multi-disciplinary discharge planning process, led by the attending physician.  Recommendations may be updated based on patient status, additional functional criteria and insurance authorization.  Follow Up Recommendations  Follow physician's recommendations for discharge plan and follow up therapies     Assistance Recommended at Discharge Intermittent Supervision/Assistance  Patient can return home with the following A little help with walking and/or transfers;A little help with bathing/dressing/bathroom;Assistance with cooking/housework;Assist for transportation;Help with stairs or ramp for entrance   Equipment Recommendations  None recommended by PT    Recommendations for Other Services       Precautions / Restrictions Precautions Precautions: Knee;Fall Required Braces or Orthoses: Knee Immobilizer - Right Knee Immobilizer - Right: Discontinue once straight leg raise with < 10 degree lag Restrictions Weight Bearing Restrictions: No RLE Weight Bearing: Weight bearing as tolerated     Mobility  Bed Mobility Overal bed mobility: Needs Assistance Bed Mobility: Sit to Supine     Supine to sit: Min assist Sit to supine: Min assist   General bed mobility comments: cues for sequence and use of L LE to self assist    Transfers Overall transfer level: Needs assistance Equipment used: Rolling walker (2 wheels) Transfers: Sit to/from Stand Sit to Stand: Min guard, Supervision           General transfer comment:  cues for LE management and use of UEs to self assist    Ambulation/Gait Ambulation/Gait assistance: Min guard, Supervision Gait Distance (Feet): 75 Feet Assistive device: Rolling walker (2 wheels) Gait Pattern/deviations: Step-to pattern, Decreased step length - right, Decreased step length - left, Shuffle, Antalgic, Trunk flexed Gait velocity: decr     General Gait Details: cues for sequence, posture and position from RW   Stairs Stairs: Yes Stairs assistance: Min assist Stair Management: One rail Right, Step to pattern, Forwards, With cane Number of Stairs: 5 General stair comments: cues for sequence   Wheelchair Mobility    Modified Rankin (Stroke Patients Only)       Balance Overall balance assessment: Needs assistance Sitting-balance support: No upper extremity supported, Feet supported Sitting balance-Leahy Scale: Good     Standing balance support: Single extremity supported Standing balance-Leahy Scale: Poor                              Cognition Arousal/Alertness: Awake/alert Behavior During Therapy: WFL for tasks assessed/performed Overall Cognitive Status: Within Functional Limits for tasks assessed                                          Exercises Total Joint Exercises Ankle Circles/Pumps: AROM, Both, 15 reps, Supine Quad Sets: AROM, Both, 10 reps, Supine Heel Slides: AAROM, Right, 10 reps, Supine Straight Leg Raises: AAROM, Right, 10 reps, Supine    General Comments        Pertinent Vitals/Pain Pain Assessment Pain Assessment: 0-10  Pain Score: 5  Pain Location: R knee Pain Descriptors / Indicators: Aching, Grimacing, Sore Pain Intervention(s): Limited activity within patient's tolerance, Monitored during session, Premedicated before session, Ice applied    Home Living                          Prior Function            PT Goals (current goals can now be found in the care plan section) Acute  Rehab PT Goals Patient Stated Goal: Regain IND PT Goal Formulation: With patient Time For Goal Achievement: 10/26/22 Potential to Achieve Goals: Good Progress towards PT goals: Progressing toward goals    Frequency    7X/week      PT Plan Current plan remains appropriate    Co-evaluation              AM-PAC PT "6 Clicks" Mobility   Outcome Measure  Help needed turning from your back to your side while in a flat bed without using bedrails?: A Little Help needed moving from lying on your back to sitting on the side of a flat bed without using bedrails?: A Little Help needed moving to and from a bed to a chair (including a wheelchair)?: A Little Help needed standing up from a chair using your arms (e.g., wheelchair or bedside chair)?: A Little Help needed to walk in hospital room?: A Little Help needed climbing 3-5 steps with a railing? : A Little 6 Click Score: 18    End of Session Equipment Utilized During Treatment: Gait belt;Right knee immobilizer Activity Tolerance: Patient tolerated treatment well Patient left: with call bell/phone within reach;with family/visitor present;in bed Nurse Communication: Mobility status PT Visit Diagnosis: Difficulty in walking, not elsewhere classified (R26.2)     Time: 1308-6578 PT Time Calculation (min) (ACUTE ONLY): 25 min  Charges:  $Gait Training: 8-22 mins $Therapeutic Activity: 8-22 mins                     [place] Pager [phone] Office [phone]    Akeisha Lagerquist 10/13/2022, 4:26 PM

## 2022-10-15 ENCOUNTER — Encounter (HOSPITAL_COMMUNITY): Payer: Self-pay | Admitting: Orthopaedic Surgery

## 2022-10-22 ENCOUNTER — Other Ambulatory Visit: Payer: Self-pay | Admitting: Orthopaedic Surgery

## 2022-10-22 ENCOUNTER — Telehealth: Payer: Self-pay | Admitting: Orthopaedic Surgery

## 2022-10-22 MED ORDER — HYDROMORPHONE HCL 4 MG PO TABS: 4.0000 mg | ORAL_TABLET | ORAL | 0 refills | Status: DC | PRN

## 2022-10-22 NOTE — Telephone Encounter (Signed)
Pt called requesting a refill pain medication. Please send to [facility] on [address]. Pt phone number is [phone].

## 2022-10-25 ENCOUNTER — Encounter: Payer: Self-pay | Admitting: Orthopaedic Surgery

## 2022-10-25 ENCOUNTER — Other Ambulatory Visit: Payer: Self-pay

## 2022-10-25 ENCOUNTER — Ambulatory Visit (INDEPENDENT_AMBULATORY_CARE_PROVIDER_SITE_OTHER): Payer: 59 | Admitting: Orthopaedic Surgery

## 2022-10-25 DIAGNOSIS — Z96651 Presence of right artificial knee joint: Secondary | ICD-10-CM

## 2022-10-25 NOTE — Progress Notes (Signed)
The patient is here for his first postoperative visit status post a right total knee arthroplasty.  Notes from home therapy stated that he has been working on mobility.  At one point he was able to flex his knee to 80 degrees.  He has been happy with home therapy and understands he is to transition send outpatient therapy.  We will work on getting that set up within the [facility] system.  He has been compliant with a baby aspirin twice daily.  He just got a refill of pain medications recently.  His right knee incision looks good.  The staples are removed and Steri-Strips applied.  There is calf is soft.  He has almost full extension but I can only flex him to maybe 80 to 85 degrees.  He will continue to push himself hard with working on his knee daily.  When he does run on pain medication he will give [place] a call.  From my standpoint we will see him back in 4 weeks after he is been through outpatient physical therapy.  No x-rays are needed.  All questions and concerns were answered and addressed.

## 2022-10-29 ENCOUNTER — Encounter: Payer: Self-pay | Admitting: Physical Therapy

## 2022-10-29 ENCOUNTER — Other Ambulatory Visit: Payer: Self-pay

## 2022-10-29 ENCOUNTER — Ambulatory Visit: Payer: 59 | Admitting: Physical Therapy

## 2022-10-29 ENCOUNTER — Telehealth: Payer: Self-pay | Admitting: Orthopaedic Surgery

## 2022-10-29 DIAGNOSIS — R2689 Other abnormalities of gait and mobility: Secondary | ICD-10-CM

## 2022-10-29 DIAGNOSIS — R600 Localized edema: Secondary | ICD-10-CM | POA: Diagnosis not present

## 2022-10-29 DIAGNOSIS — M25561 Pain in right knee: Secondary | ICD-10-CM | POA: Diagnosis not present

## 2022-10-29 DIAGNOSIS — R2681 Unsteadiness on feet: Secondary | ICD-10-CM

## 2022-10-29 DIAGNOSIS — M25661 Stiffness of right knee, not elsewhere classified: Secondary | ICD-10-CM

## 2022-10-29 NOTE — Telephone Encounter (Signed)
Pt stated that Dr Ninfa Linden told him if he was having dental work that he would call in him an antibiotic. Best number to reach pt [phone]

## 2022-10-29 NOTE — Therapy (Signed)
OUTPATIENT PHYSICAL THERAPY LOWER EXTREMITY EVALUATION   Patient Name: Charles Morales MRN: [id number] DOB:1961-06-15, 61 y.o., male Today's Date: 10/29/2022   PT End of Session - 10/29/22 1242     Visit Number 1    Number of Visits 16    Date for PT Re-Evaluation 12/24/22    Authorization Type [employer] $25 copay, 60 visit limit    PT Start Time 1145    PT Stop Time 1219    PT Time Calculation (min) 34 min    Activity Tolerance Patient tolerated treatment well    Behavior During Therapy WFL for tasks assessed/performed             Past Medical History:  Diagnosis Date   Allergy    Arthritis    right knee   Esophageal reflux    Family history of breast cancer    Family history of genetic disease carrier    sister NBN pathogenic variant   Family history of pancreatic cancer    Fatty liver    HLD (hyperlipidemia)    HTN (hypertension)    Morbid obesity ([place])    Pre-diabetes    Sleep apnea    USES CPAP    Tobacco use disorder    Unspecified sleep apnea    Past Surgical History:  Procedure Laterality Date   COLONOSCOPY  2013   COLONOSCOPY WITH PROPOFOL N/A 05/20/2018   Procedure: COLONOSCOPY WITH PROPOFOL;  Surgeon: Jerene Bears, MD;  Location: [facility];  Service: Gastroenterology;  Laterality: N/A;   KNEE ARTHROSCOPY     left   TOTAL KNEE ARTHROPLASTY Right 10/12/2022   Procedure: RIGHT TOTAL KNEE ARTHROPLASTY;  Surgeon: Mcarthur Rossetti, MD;  Location: [facility];  Service: Orthopedics;  Laterality: Right;   Patient Active Problem List   Diagnosis Date Noted   Status post total right knee replacement 10/12/2022   Current use of proton pump inhibitor 11/20/2021   Genetic testing 09/15/2018   Family history of genetic disease carrier 09/02/2018   Family history of breast cancer    Family history of pancreatic cancer    Prostate cancer screening 08/17/2018   History of colonic polyps    Prediabetes 04/01/2016   Left ankle pain 10/14/2015   PVC's  (premature ventricular contractions) 10/22/2013   Colon cancer screening 05/23/2012   Routine general medical examination at a health care facility 05/20/2012   Knee pain, right 06/08/2011   G E R D 08/04/2007   Hyperlipidemia 07/22/2007   Morbid obesity ([place]) 07/22/2007   SMOKELESS TOBACCO ABUSE 07/22/2007   Essential hypertension 07/22/2007   Sleep apnea 07/22/2007    PCP: Loura Pardon, MD  REFERRING PROVIDER: Mcarthur Rossetti, MD   REFERRING DIAG: [phone] (ICD-10-CM) - Status post total right knee replacement   THERAPY DIAG:  Acute pain of right knee - Plan: PT plan of care cert/re-cert  Stiffness of right knee, not elsewhere classified - Plan: PT plan of care cert/re-cert  Localized edema - Plan: PT plan of care cert/re-cert  Unsteadiness on feet - Plan: PT plan of care cert/re-cert  Other abnormalities of gait and mobility - Plan: PT plan of care cert/re-cert  Rationale for Evaluation and Treatment: Rehabilitation  ONSET DATE: 10/12/22  SUBJECTIVE:   SUBJECTIVE STATEMENT: Pt is s/p Rt TKA on 10/12/22, and had 7 HHPT visits.  He is walking with a SBQC currently.    PERTINENT HISTORY: Morbid obesity, OA, HTN, OSA  PAIN:  Are you having pain? Yes: NPRS scale: 2  currently, up to 6, at best 2/10 Pain location: Rt knee Pain description: tightness, aching, sore Aggravating factors: bending knee, prolonged positioning Relieving factors: ice, walking  PRECAUTIONS: None  WEIGHT BEARING RESTRICTIONS: No  FALLS:  Has patient fallen in last 6 months? 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)  PLOF: Independent and Leisure: work, walking on treadmill occasionally, play  golf  PATIENT GOALS: improve pain, regain use of RLE, return to work, play golf   OBJECTIVE:   PATIENT SURVEYS:  10/29/22: FOTO 63 (predicted 75)  COGNITION: Overall cognitive status: Within functional limits for tasks assessed     SENSATION: Not tested  EDEMA:  10/29/22 Circumferential: Knee Joint Line: Rt: 47.7 cm  /Lt: 44.5 cm  POSTURE: rounded shoulders and forward head   LOWER EXTREMITY ROM:  Active ROM Right eval Left eval  Knee flexion 79   Knee extension -3 (seated LAQ)    (Blank rows = not tested)   Passive ROM Right eval Left eval  Knee flexion 90   Knee extension -1    (Blank rows = not tested)    LOWER EXTREMITY MMT:  MMT Right eval Left eval  Hip flexion    Hip extension    Hip abduction    Hip adduction    Hip internal rotation    Hip external rotation    Knee flexion 3-/5   Knee extension 3-/5   Ankle dorsiflexion    Ankle plantarflexion    Ankle inversion    Ankle eversion     (Blank rows = not tested)  FUNCTIONAL TESTS:  Deferred today  GAIT: Distance walked: 100' Assistive device utilized: Quad cane small base Level of assistance: Modified independence Comments: step to pattern, decr stance on Rt; decr hip/knee flexion   TODAY'S TREATMENT:                                                                                                                              DATE: 10/29/22 See HEP - performed trial reps with mod cues for comprehension    PATIENT EDUCATION:  Education details: HEP Person educated: Patient Education method: Explanation, Demonstration, and Handouts Education comprehension: verbalized understanding, returned demonstration, and needs further education  HOME EXERCISE PROGRAM: Access Code: [email or web address] URL: [email or web address] Date: 10/29/2022 Prepared by: Faustino Congress  Exercises - Quad Set  - 5-10 x daily - 7 x weekly - 1 sets - 5-10 reps - 5 sec hold - Supine Heel Slide with  Strap  - 5-10 x daily - 7 x weekly - 1 sets - 5-10 reps - 5 sec hold - Seated Knee Flexion AAROM  - 3-5 x daily - 7 x weekly - 1 sets -  10 reps - 10 sec hold - Seated Knee Extension AROM  - 3-5 x daily - 7 x weekly - 1 sets - 10 reps - 5 sec hold - Seated Straight Leg Raise  - 3-5 x daily - 7 x weekly - 1 sets - 10 reps - 5 sec hold  ASSESSMENT:  CLINICAL IMPRESSION: Patient is a 61 y.o. male who was seen today for physical therapy evaluation and treatment for Rt TKA.  He demonstrates decreased strength and ROM, gait abnormalities and balance deficits, as well as expected post-op pain and swelling affecting functional mobility.  He will benefit from PT to address deficits listed.    OBJECTIVE IMPAIRMENTS: Abnormal gait, decreased activity tolerance, decreased balance, decreased knowledge of use of DME, decreased mobility, difficulty walking, decreased ROM, decreased strength, increased edema, increased fascial restrictions, increased muscle spasms, impaired flexibility, and pain.   ACTIVITY LIMITATIONS: carrying, lifting, standing, sleeping, stairs, transfers, and locomotion level  PARTICIPATION LIMITATIONS: cleaning, driving, community activity, occupation, and yard work  PERSONAL FACTORS: 3+ comorbidities: Morbid obesity, OA, HTN, OSA  are also affecting patient's functional outcome.   REHAB POTENTIAL: Good  CLINICAL DECISION MAKING: Evolving/moderate complexity  EVALUATION COMPLEXITY: Moderate   GOALS: Goals reviewed with patient? Yes  SHORT TERM GOALS: Target date: 11/26/2022  Independent with initial HEP Goal status: INITIAL  2.  Rt knee AROM 0-100 deg for improved function and mobility Goal status: INITIAL  LONG TERM GOALS: Target date: 12/24/2022  Independent with final HEP Goal status: INITIAL  2.  FOTO score improved to 75 Goal status: INITIAL  3.  Rt knee AROM improved to 0-110 for improved function Goal status: INIITAL  4.  Report pain < 3/10 with standing  and walking activities for improved function Goal status: INITIAL  5.  Amb without AD without significant deviations for improved mobility Goal status: INITIAL    PLAN:  PT FREQUENCY: 2x/week  PT DURATION: 8 weeks  PLANNED INTERVENTIONS: Therapeutic exercises, Therapeutic activity, Neuromuscular re-education, Balance training, Gait training, Patient/Family education, Self Care, Joint mobilization, Stair training, DME instructions, Aquatic Therapy, Dry Needling, Electrical stimulation, Cryotherapy, Moist heat, Taping, Vasopneumatic device, Manual therapy, and Re-evaluation  PLAN FOR NEXT SESSION: review/update HEP PRN, aggressive flexion focus, vaso PRN   Laureen Abrahams, PT, DPT 10/29/22 12:45 PM

## 2022-10-30 ENCOUNTER — Other Ambulatory Visit: Payer: Self-pay

## 2022-10-30 MED ORDER — AMOXICILLIN 500 MG PO TABS: ORAL_TABLET | ORAL | 0 refills | Status: DC

## 2022-10-30 NOTE — Telephone Encounter (Signed)
LMOM for patient that this was sent into his pharmacy for him

## 2022-11-01 ENCOUNTER — Ambulatory Visit: Payer: 59 | Admitting: Rehabilitative and Restorative Service Providers"

## 2022-11-01 ENCOUNTER — Encounter: Payer: Self-pay | Admitting: Rehabilitative and Restorative Service Providers"

## 2022-11-01 DIAGNOSIS — R600 Localized edema: Secondary | ICD-10-CM | POA: Diagnosis not present

## 2022-11-01 DIAGNOSIS — R2681 Unsteadiness on feet: Secondary | ICD-10-CM | POA: Diagnosis not present

## 2022-11-01 DIAGNOSIS — M25561 Pain in right knee: Secondary | ICD-10-CM

## 2022-11-01 DIAGNOSIS — R2689 Other abnormalities of gait and mobility: Secondary | ICD-10-CM

## 2022-11-01 DIAGNOSIS — M25661 Stiffness of right knee, not elsewhere classified: Secondary | ICD-10-CM

## 2022-11-01 NOTE — Therapy (Signed)
OUTPATIENT PHYSICAL THERAPY TREATMENT NOTE   Patient Name: Charles Morales MRN: [id number] DOB:07-Jan-1961, 61 y.o., male Today's Date: 11/01/2022  PCP: Loura Pardon, MD  REFERRING PROVIDER: Mcarthur Rossetti, MD    END OF SESSION:   PT End of Session - 11/01/22 1047     Visit Number 2    Number of Visits 16    Date for PT Re-Evaluation 12/24/22    Authorization Type [employer] $25 copay, 60 visit limit    PT Start Time 1048    PT Stop Time 1138    PT Time Calculation (min) 50 min    Activity Tolerance Patient tolerated treatment well    Behavior During Therapy WFL for tasks assessed/performed             Past Medical History:  Diagnosis Date   Allergy    Arthritis    right knee   Esophageal reflux    Family history of breast cancer    Family history of genetic disease carrier    sister NBN pathogenic variant   Family history of pancreatic cancer    Fatty liver    HLD (hyperlipidemia)    HTN (hypertension)    Morbid obesity ([place])    Pre-diabetes    Sleep apnea    USES CPAP    Tobacco use disorder    Unspecified sleep apnea    Past Surgical History:  Procedure Laterality Date   COLONOSCOPY  2013   COLONOSCOPY WITH PROPOFOL N/A 05/20/2018   Procedure: COLONOSCOPY WITH PROPOFOL;  Surgeon: Jerene Bears, MD;  Location: [facility];  Service: Gastroenterology;  Laterality: N/A;   KNEE ARTHROSCOPY     left   TOTAL KNEE ARTHROPLASTY Right 10/12/2022   Procedure: RIGHT TOTAL KNEE ARTHROPLASTY;  Surgeon: Mcarthur Rossetti, MD;  Location: [facility];  Service: Orthopedics;  Laterality: Right;   Patient Active Problem List   Diagnosis Date Noted   Status post total right knee replacement 10/12/2022   Current use of proton pump inhibitor 11/20/2021   Genetic testing 09/15/2018   Family history of genetic disease carrier 09/02/2018   Family history of breast cancer    Family history of pancreatic cancer    Prostate cancer screening 08/17/2018   History of  colonic polyps    Prediabetes 04/01/2016   Left ankle pain 10/14/2015   PVC's (premature ventricular contractions) 10/22/2013   Colon cancer screening 05/23/2012   Routine general medical examination at a health care facility 05/20/2012   Knee pain, right 06/08/2011   G E R D 08/04/2007   Hyperlipidemia 07/22/2007   Morbid obesity ([place]) 07/22/2007   SMOKELESS TOBACCO ABUSE 07/22/2007   Essential hypertension 07/22/2007   Sleep apnea 07/22/2007    REFERRING DIAG: Z96.651 (ICD-10-CM) - Status post total right knee replacement    THERAPY DIAG:  Acute pain of right knee  Stiffness of right knee, not elsewhere classified  Localized edema  Unsteadiness on feet  Other abnormalities of gait and mobility  Rationale for Evaluation and Treatment Rehabilitation  PERTINENT HISTORY: Morbid obesity, OA, HTN, OSA  PRECAUTIONS: None  SUBJECTIVE:    SUBJECTIVE STATEMENT: Pt indicated when waking, pain was at 6/10 or 7/10.  Pt indicated really stiff this morning.  Did some exercise to help.  Rated 3/10 upon arrival.    PAIN:  NPRS scale: 3/10 Pain location: Rt knee Pain description: tightness, aching, sore Aggravating factors: bending knee, prolonged positioning Relieving factors: ice, walking    WEIGHT BEARING RESTRICTIONS: No  FALLS:  Has patient fallen in last 6 months? 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)   PLOF: Independent and Leisure: work, walking on treadmill occasionally, play golf   PATIENT GOALS: improve pain, regain use of RLE, return to work, play golf     OBJECTIVE:    PATIENT SURVEYS:  10/29/22: FOTO 63 (predicted 75)   COGNITION: 10/29/2022 Overall  cognitive status: Within functional limits for tasks assessed                          SENSATION: 10/29/2022 Not tested   EDEMA:  10/29/22 Circumferential: Knee Joint Line: Rt: 47.7 cm  /Lt: 44.5 cm   POSTURE:  10/30/2023rounded shoulders and forward head     LOWER EXTREMITY ROM:   Active ROM Right 10/29/2022 Left 10/29/2022  Knee flexion 79    Knee extension -3 (seated LAQ)     (Blank rows = not tested)     Passive ROM Right 10/29/2022 Left 10/29/2022  Knee flexion 90    Knee extension -1     (Blank rows = not tested)       LOWER EXTREMITY MMT:   MMT Right 10/29/2022 Left 10/29/2022  Hip flexion      Hip extension      Hip abduction      Hip adduction      Hip internal rotation      Hip external rotation      Knee flexion 3-/5    Knee extension 3-/5    Ankle dorsiflexion      Ankle plantarflexion      Ankle inversion      Ankle eversion       (Blank rows = not tested)   FUNCTIONAL TESTS:  10/29/2022 Deferred today   GAIT: 10/29/2022 Distance walked: 100' Assistive device utilized: Lobbyist Level of assistance: Modified independence Comments: step to pattern, decr stance on Rt; decr hip/knee flexion     TODAY'S TREATMENT:                                                                                                                             11/01/2022 Therex: Nustep Lvl 5 LE only 8 mins , seat 11 Incline Gastroc stretch 30 sec x 5 bilateral  Seated Rt knee LAQ c end range pauses in both directions c contralateral movement opposite x 15, 2 lb x 15 Seated quad set c SLR 2 x 10 Rt Sit to stand to sit no UE assist x 10  Supine heel slide 5 sec hold Rt leg x 10 AROM  Neuro Re-ed Anterior/posterior weight shift ankles 2 mins  Manual: Seated Rt knee flexion , distraction and IR mobilization c movement for flexion mobility gains.   Vaso 10 mins Rt leg medium compression 34 degrees in elevation.    DATE: 10/29/22 See HEP -  performed trial reps with mod cues for comprehension      PATIENT EDUCATION:  Education details: HEP Person educated: Patient Education method: Explanation, Demonstration, and Handouts Education comprehension: verbalized understanding, returned demonstration, and needs further education   HOME EXERCISE PROGRAM: Access Code: [email or web address] URL: [email or web address] Date: 10/29/2022 Prepared by: Faustino Congress   Exercises - Quad Set  - 5-10 x daily - 7 x weekly - 1 sets - 5-10 reps - 5 sec hold - Supine Heel Slide with Strap  - 5-10 x daily - 7 x weekly - 1 sets - 5-10 reps - 5 sec hold - Seated Knee Flexion AAROM  - 3-5 x daily - 7 x weekly - 1 sets - 10 reps - 10 sec hold - Seated Knee Extension AROM  - 3-5 x daily - 7 x weekly - 1 sets - 10 reps - 5 sec hold - Seated Straight Leg Raise  - 3-5 x daily - 7 x weekly - 1 sets - 10 reps - 5 sec hold   ASSESSMENT:   CLINICAL IMPRESSION: Good recall to HEP upon review.  Continued process of mobility and strength improvements to facilitate progression towards goals.  Continued skilled PT indicated at this time.    OBJECTIVE IMPAIRMENTS: Abnormal gait, decreased activity tolerance, decreased balance, decreased knowledge of use of DME, decreased mobility, difficulty walking, decreased ROM, decreased strength, increased edema, increased fascial restrictions, increased muscle spasms, impaired flexibility, and pain.    ACTIVITY LIMITATIONS: carrying, lifting, standing, sleeping, stairs, transfers, and locomotion level   PARTICIPATION LIMITATIONS: cleaning, driving, community activity, occupation, and yard work   PERSONAL FACTORS: 3+ comorbidities: Morbid obesity, OA, HTN, OSA  are also affecting patient's functional outcome.    REHAB POTENTIAL: Good   CLINICAL DECISION MAKING: Evolving/moderate complexity   EVALUATION COMPLEXITY: Moderate     GOALS: Goals reviewed with patient? Yes   SHORT TERM GOALS: Target date:  11/26/2022   Independent with initial HEP Goal status: on going - 11/01/2022   2.  Rt knee AROM 0-100 deg for improved function and mobility Goal status: on going - 11/01/2022   LONG TERM GOALS: Target date: 12/24/2022   Independent with final HEP Goal status: INITIAL   2.  FOTO score improved to 75 Goal status: INITIAL   3.  Rt knee AROM improved to 0-110 for improved function Goal status: INIITAL   4.  Report pain < 3/10 with standing and walking activities for improved function Goal status: INITIAL   5.  Amb without AD without significant deviations for improved mobility Goal status: INITIAL       PLAN:   PT FREQUENCY: 2x/week   PT DURATION: 8 weeks   PLANNED INTERVENTIONS: Therapeutic exercises, Therapeutic activity, Neuromuscular re-education, Balance training, Gait training, Patient/Family education, Self Care, Joint mobilization, Stair training, DME instructions, Aquatic Therapy, Dry Needling, Electrical stimulation, Cryotherapy, Moist heat, Taping, Vasopneumatic device, Manual therapy, and Re-evaluation   PLAN FOR NEXT SESSION: Leg press, bike possible.  Early static ballance.      Scot Jun, PT, DPT, OCS, ATC 11/01/22  11:28 AM

## 2022-11-02 ENCOUNTER — Other Ambulatory Visit: Payer: Self-pay | Admitting: Orthopaedic Surgery

## 2022-11-02 ENCOUNTER — Telehealth: Payer: Self-pay | Admitting: Orthopaedic Surgery

## 2022-11-02 MED ORDER — HYDROMORPHONE HCL 4 MG PO TABS: 4.0000 mg | ORAL_TABLET | ORAL | 0 refills | Status: DC | PRN

## 2022-11-02 NOTE — Telephone Encounter (Signed)
Please advise 

## 2022-11-02 NOTE — Telephone Encounter (Signed)
Pt called requesting a refill of pain medication. Please send to [facility] on [address]. Pt phone number is [phone].

## 2022-11-05 ENCOUNTER — Encounter: Payer: 59 | Admitting: Rehabilitative and Restorative Service Providers"

## 2022-11-05 ENCOUNTER — Encounter: Payer: Self-pay | Admitting: Rehabilitative and Restorative Service Providers"

## 2022-11-05 ENCOUNTER — Ambulatory Visit: Payer: 59 | Admitting: Rehabilitative and Restorative Service Providers"

## 2022-11-05 DIAGNOSIS — M25661 Stiffness of right knee, not elsewhere classified: Secondary | ICD-10-CM | POA: Diagnosis not present

## 2022-11-05 DIAGNOSIS — R2681 Unsteadiness on feet: Secondary | ICD-10-CM

## 2022-11-05 DIAGNOSIS — R600 Localized edema: Secondary | ICD-10-CM

## 2022-11-05 DIAGNOSIS — R2689 Other abnormalities of gait and mobility: Secondary | ICD-10-CM

## 2022-11-05 DIAGNOSIS — M25561 Pain in right knee: Secondary | ICD-10-CM

## 2022-11-05 NOTE — Therapy (Signed)
OUTPATIENT PHYSICAL THERAPY TREATMENT NOTE   Patient Name: Charles Morales MRN: [id number] DOB:[birth year], 61 y.o., male Today's Date: 11/05/2022  PCP: Loura Pardon, MD  REFERRING PROVIDER: Mcarthur Rossetti, MD    END OF SESSION:   PT End of Session - 11/05/22 0925     Visit Number 3    Number of Visits 16    Date for PT Re-Evaluation 12/24/22    Authorization Type [employer] $25 copay, 60 visit limit    PT Start Time 0925    PT Stop Time 1017    PT Time Calculation (min) 52 min    Activity Tolerance Patient tolerated treatment well    Behavior During Therapy WFL for tasks assessed/performed              Past Medical History:  Diagnosis Date   Allergy    Arthritis    right knee   Esophageal reflux    Family history of breast cancer    Family history of genetic disease carrier    sister NBN pathogenic variant   Family history of pancreatic cancer    Fatty liver    HLD (hyperlipidemia)    HTN (hypertension)    Morbid obesity ([place])    Pre-diabetes    Sleep apnea    USES CPAP    Tobacco use disorder    Unspecified sleep apnea    Past Surgical History:  Procedure Laterality Date   COLONOSCOPY  2013   COLONOSCOPY WITH PROPOFOL N/A 05/20/2018   Procedure: COLONOSCOPY WITH PROPOFOL;  Surgeon: Jerene Bears, MD;  Location: [facility];  Service: Gastroenterology;  Laterality: N/A;   KNEE ARTHROSCOPY     left   TOTAL KNEE ARTHROPLASTY Right 10/12/2022   Procedure: RIGHT TOTAL KNEE ARTHROPLASTY;  Surgeon: Mcarthur Rossetti, MD;  Location: [facility];  Service: Orthopedics;  Laterality: Right;   Patient Active Problem List   Diagnosis Date Noted   Status post total right knee replacement 10/12/2022   Current use of proton pump inhibitor 11/20/2021   Genetic testing 09/15/2018   Family history of genetic disease carrier 09/02/2018   Family history of breast cancer    Family history of pancreatic cancer    Prostate cancer screening 08/17/2018   History of  colonic polyps    Prediabetes 04/01/2016   Left ankle pain 10/14/2015   PVC's (premature ventricular contractions) 10/22/2013   Colon cancer screening 05/23/2012   Routine general medical examination at a health care facility 05/20/2012   Knee pain, right 06/08/2011   G E R D 08/04/2007   Hyperlipidemia 07/22/2007   Morbid obesity ([place]) 07/22/2007   SMOKELESS TOBACCO ABUSE 07/22/2007   Essential hypertension 07/22/2007   Sleep apnea 07/22/2007    REFERRING DIAG: Z96.651 (ICD-10-CM) - Status post total right knee replacement    THERAPY DIAG:  Acute pain of right knee  Stiffness of right knee, not elsewhere classified  Localized edema  Unsteadiness on feet  Other abnormalities of gait and mobility  Rationale for Evaluation and Treatment Rehabilitation  PERTINENT HISTORY: Morbid obesity, OA, HTN, OSA  PRECAUTIONS: None  SUBJECTIVE:    SUBJECTIVE STATEMENT: Pt indicated feeling stiffness this morning.  No pain increase really until getting to tight part of motion.    PAIN:  NPRS scale: 2/10 pain Pain location: Rt knee Pain description: tightness, aching, sore Aggravating factors: bending knee, prolonged positioning Relieving factors: ice, walking    WEIGHT BEARING RESTRICTIONS: No   FALLS:  Has patient fallen in last  6 months? 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)   PLOF: Independent and Leisure: work, walking on treadmill occasionally, play golf   PATIENT GOALS: improve pain, regain use of RLE, return to work, play golf     OBJECTIVE:    PATIENT SURVEYS:  10/29/22: FOTO 63 (predicted 75)   COGNITION: 10/29/2022 Overall cognitive status: Within functional  limits for tasks assessed                          SENSATION: 10/29/2022 Not tested   EDEMA:  10/29/22 Circumferential: Knee Joint Line: Rt: 47.7 cm  /Lt: 44.5 cm   POSTURE:  10/30/2023rounded shoulders and forward head     LOWER EXTREMITY ROM:   Active ROM Right 10/29/2022 Left 11/05/2022  Knee flexion 79 98 in supine heel slide AROM   Knee extension -3 (seated LAQ)  -3 in seated LAQ AROM   (Blank rows = not tested)     Passive ROM Right 10/29/2022 Left 10/29/2022  Knee flexion 90    Knee extension -1     (Blank rows = not tested)       LOWER EXTREMITY MMT:   MMT Right 10/29/2022 Left 10/29/2022  Hip flexion      Hip extension      Hip abduction      Hip adduction      Hip internal rotation      Hip external rotation      Knee flexion 3-/5    Knee extension 3-/5    Ankle dorsiflexion      Ankle plantarflexion      Ankle inversion      Ankle eversion       (Blank rows = not tested)   FUNCTIONAL TESTS:  10/29/2022 Deferred today   GAIT: 10/29/2022 Distance walked: 100' Assistive device utilized: Lobbyist Level of assistance: Modified independence Comments: step to pattern, decr stance on Rt; decr hip/knee flexion     TODAY'S TREATMENT:                                                                                                                             11/05/2022 Therex: Recumbent bike seat 8 partial rev to full rev 8 mins Leg press Double leg 62 lbs x 15, single leg 31 lbs x 15 Incline Gastroc stretch 30 sec x 3 bilateral  Seated LAQ 3 lbs 2 x 15 c pause in each end range Seated SLR 2 x 10  Supine AROM heel slide 5 sec hold x 10    Neuro Re-ed Tandem stance 1 min x 2 bilateral occasional hand assist (will need foam  next time) Retro step x 15 Rt leg posterior   Vaso 10 mins Rt leg medium compression 34 degrees in elevation.   11/01/2022 Therex: Nustep Lvl 5 LE only 8 mins , seat 11 Incline Gastroc stretch 30 sec x 5  bilateral  Seated Rt knee LAQ c end range pauses in both directions c contralateral movement opposite x 15, 2 lb x 15 Seated quad set c SLR 2 x 10 Rt Sit to stand to sit no UE assist x 10  Supine heel slide 5 sec hold Rt leg x 10 AROM  Neuro Re-ed Anterior/posterior weight shift ankles 2 mins   Manual: Seated Rt knee flexion , distraction and IR mobilization c movement for flexion mobility gains.   Vaso 10 mins Rt leg medium compression 34 degrees in elevation.    DATE: 10/29/22 See HEP - performed trial reps with mod cues for comprehension      PATIENT EDUCATION:  Education details: HEP Person educated: Patient Education method: Explanation, Demonstration, and Handouts Education comprehension: verbalized understanding, returned demonstration, and needs further education   HOME EXERCISE PROGRAM: Access Code: [email or web address] URL: [email or web address] Date: 10/29/2022 Prepared by: Faustino Congress   Exercises - Quad Set  - 5-10 x daily - 7 x weekly - 1 sets - 5-10 reps - 5 sec hold - Supine Heel Slide with Strap  - 5-10 x daily - 7 x weekly - 1 sets - 5-10 reps - 5 sec hold - Seated Knee Flexion AAROM  - 3-5 x daily - 7 x weekly - 1 sets - 10 reps - 10 sec hold - Seated Knee Extension AROM  - 3-5 x daily - 7 x weekly - 1 sets - 10 reps - 5 sec hold - Seated Straight Leg Raise  - 3-5 x daily - 7 x weekly - 1 sets - 10 reps - 5 sec hold   ASSESSMENT:   CLINICAL IMPRESSION: Improving quad activation noted.  Encouraged SLR with increased sets at home.  Fair control on balance continued.    OBJECTIVE IMPAIRMENTS: Abnormal gait, decreased activity tolerance, decreased balance, decreased knowledge of use of DME, decreased mobility, difficulty walking, decreased ROM, decreased strength, increased edema, increased fascial restrictions, increased muscle spasms, impaired flexibility, and pain.    ACTIVITY LIMITATIONS: carrying, lifting, standing, sleeping, stairs,  transfers, and locomotion level   PARTICIPATION LIMITATIONS: cleaning, driving, community activity, occupation, and yard work   PERSONAL FACTORS: 3+ comorbidities: Morbid obesity, OA, HTN, OSA  are also affecting patient's functional outcome.    REHAB POTENTIAL: Good   CLINICAL DECISION MAKING: Evolving/moderate complexity   EVALUATION COMPLEXITY: Moderate     GOALS: Goals reviewed with patient? Yes   SHORT TERM GOALS: Target date: 11/26/2022   Independent with initial HEP Goal status: on going - 11/01/2022   2.  Rt knee AROM 0-100 deg for improved function and mobility Goal status: on going - 11/01/2022   LONG TERM GOALS: Target date: 12/24/2022   Independent with final HEP Goal status: INITIAL   2.  FOTO score improved to 75 Goal status: INITIAL   3.  Rt knee AROM improved to 0-110 for improved function Goal status: INIITAL   4.  Report pain < 3/10 with standing and walking activities for improved function Goal status: INITIAL   5.  Amb without AD without significant deviations for improved mobility Goal status: INITIAL       PLAN:   PT FREQUENCY: 2x/week   PT DURATION: 8 weeks   PLANNED INTERVENTIONS:  Therapeutic exercises, Therapeutic activity, Neuromuscular re-education, Balance training, Gait training, Patient/Family education, Self Care, Joint mobilization, Stair training, DME instructions, Aquatic Therapy, Dry Needling, Electrical stimulation, Cryotherapy, Moist heat, Taping, Vasopneumatic device, Manual therapy, and Re-evaluation   PLAN FOR NEXT SESSION: Stair low height, progressive strengthening continued.  Resume manual next visit.      Scot Jun, PT, DPT, OCS, ATC 11/05/22  10:19 AM

## 2022-11-08 ENCOUNTER — Encounter: Payer: Self-pay | Admitting: Physical Therapy

## 2022-11-08 ENCOUNTER — Ambulatory Visit: Payer: 59 | Admitting: Physical Therapy

## 2022-11-08 ENCOUNTER — Encounter: Payer: 59 | Admitting: Physical Therapy

## 2022-11-08 DIAGNOSIS — R2689 Other abnormalities of gait and mobility: Secondary | ICD-10-CM

## 2022-11-08 DIAGNOSIS — R600 Localized edema: Secondary | ICD-10-CM | POA: Diagnosis not present

## 2022-11-08 DIAGNOSIS — M25661 Stiffness of right knee, not elsewhere classified: Secondary | ICD-10-CM

## 2022-11-08 DIAGNOSIS — M25561 Pain in right knee: Secondary | ICD-10-CM | POA: Diagnosis not present

## 2022-11-08 DIAGNOSIS — R2681 Unsteadiness on feet: Secondary | ICD-10-CM

## 2022-11-08 NOTE — Therapy (Signed)
OUTPATIENT PHYSICAL THERAPY TREATMENT NOTE   Patient Name: Charles Morales MRN: [id number] DOB:1961/11/30, 61 y.o., male Today's Date: 11/08/2022  PCP: Loura Pardon, MD  REFERRING PROVIDER: Mcarthur Rossetti, MD    END OF SESSION:   PT End of Session - 11/08/22 1142     Visit Number 4    Number of Visits 16    Date for PT Re-Evaluation 12/24/22    Authorization Type [employer] $25 copay, 60 visit limit    PT Start Time 8756    PT Stop Time 1233    PT Time Calculation (min) 50 min    Activity Tolerance Patient tolerated treatment well    Behavior During Therapy WFL for tasks assessed/performed               Past Medical History:  Diagnosis Date   Allergy    Arthritis    right knee   Esophageal reflux    Family history of breast cancer    Family history of genetic disease carrier    sister NBN pathogenic variant   Family history of pancreatic cancer    Fatty liver    HLD (hyperlipidemia)    HTN (hypertension)    Morbid obesity ([place])    Pre-diabetes    Sleep apnea    USES CPAP    Tobacco use disorder    Unspecified sleep apnea    Past Surgical History:  Procedure Laterality Date   COLONOSCOPY  2013   COLONOSCOPY WITH PROPOFOL N/A 05/20/2018   Procedure: COLONOSCOPY WITH PROPOFOL;  Surgeon: Jerene Bears, MD;  Location: [facility];  Service: Gastroenterology;  Laterality: N/A;   KNEE ARTHROSCOPY     left   TOTAL KNEE ARTHROPLASTY Right 10/12/2022   Procedure: RIGHT TOTAL KNEE ARTHROPLASTY;  Surgeon: Mcarthur Rossetti, MD;  Location: [facility];  Service: Orthopedics;  Laterality: Right;   Patient Active Problem List   Diagnosis Date Noted   Status post total right knee replacement 10/12/2022   Current use of proton pump inhibitor 11/20/2021   Genetic testing 09/15/2018   Family history of genetic disease carrier 09/02/2018   Family history of breast cancer    Family history of pancreatic cancer    Prostate cancer screening 08/17/2018   History of  colonic polyps    Prediabetes 04/01/2016   Left ankle pain 10/14/2015   PVC's (premature ventricular contractions) 10/22/2013   Colon cancer screening 05/23/2012   Routine general medical examination at a health care facility 05/20/2012   Knee pain, right 06/08/2011   G E R D 08/04/2007   Hyperlipidemia 07/22/2007   Morbid obesity ([place]) 07/22/2007   SMOKELESS TOBACCO ABUSE 07/22/2007   Essential hypertension 07/22/2007   Sleep apnea 07/22/2007    REFERRING DIAG: Z96.651 (ICD-10-CM) - Status post total right knee replacement    THERAPY DIAG:  Acute pain of right knee  Localized edema  Stiffness of right knee, not elsewhere classified  Unsteadiness on feet  Other abnormalities of gait and mobility  Rationale for Evaluation and Treatment Rehabilitation  PERTINENT HISTORY: Morbid obesity, OA, HTN, OSA  PRECAUTIONS: None  SUBJECTIVE:    SUBJECTIVE STATEMENT: Feels like he's leaving/forgetting cane more and more.   PAIN:  NPRS scale: 2/10 pain Pain location: Rt knee Pain description: tightness, aching, sore Aggravating factors: bending knee, prolonged positioning Relieving factors: ice, walking    WEIGHT BEARING RESTRICTIONS: No   FALLS:  Has patient fallen in last 6 months? 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)   PLOF: Independent and Leisure: work, walking on treadmill occasionally, play golf   PATIENT GOALS: improve pain, regain use of RLE, return to work, play golf     OBJECTIVE:    [place]:  10/29/22: FOTO 63 (predicted 75)   EDEMA:  10/29/22 Circumferential: Knee Joint Line: Rt: 47.7 cm  /Lt: 44.5 cm   LOWER EXTREMITY ROM:   Active ROM  Right 10/29/2022 Left 11/05/2022 Right 11/08/22  Knee flexion 79 98 in supine heel slide AROM  100 AA: 102  Knee extension -3 (seated LAQ)  -3 in seated LAQ AROM -2 (seated LAQ)   (Blank rows = not tested)     Passive ROM Right 10/29/2022 Left 10/29/2022  Knee flexion 90    Knee extension -1     (Blank rows = not tested)       LOWER EXTREMITY MMT:   MMT Right 10/29/2022 Left 10/29/2022  Knee flexion 3-/5    Knee extension 3-/5     (Blank rows = not tested)   FUNCTIONAL TESTS:  10/29/2022 Deferred today   GAIT: 10/29/2022 Distance walked: 100' Assistive device utilized: Lobbyist Level of assistance: Modified independence Comments: step to pattern, decr stance on Rt; decr hip/knee flexion     TODAY'S TREATMENT:                                                                                                                             11/08/22 Therex: Recumbent bike seat 8 partial rev to full rev x 8 mins Leg press Double leg 100 lbs 3x10, RLE only 50# 3x10 Seated LAQ 5# with 3 sec hold 3x10; Rt Rt AA heel slide x 10 reps Sit to/from stand x 15 reps without UE support  Neuro Re-Ed Side stepping on balance beam in // bars with intermittent UE support x 5 laps Tandem stance x 2 bil on balance beam; 20 sec with intermittent UE support Tandem walking x 3 laps with UE support on balance beam  Modalities Vaso Rt knee x 10 min, mod pressure; 34 deg  11/05/2022 Therex: Recumbent bike seat 8 partial rev to full rev 8 mins Leg press Double leg 62 lbs x 15, single leg 31 lbs x 15 Incline Gastroc stretch 30 sec x 3 bilateral  Seated LAQ 3 lbs 2 x 15 c pause in each end range Seated SLR 2 x 10  Supine AROM heel slide 5 sec hold x 10    Neuro Re-ed Tandem stance 1 min x 2 bilateral occasional hand assist (will need foam next time) Retro step x 15 Rt leg posterior   Vaso 10 mins Rt leg medium compression 34 degrees in  elevation.   11/01/2022 Therex: Nustep Lvl 5 LE only 8 mins , seat 11 Incline Gastroc stretch 30 sec x 5 bilateral  Seated Rt knee LAQ c end range pauses in both directions c contralateral movement opposite x 15, 2 lb x 15 Seated quad set c SLR 2 x 10 Rt Sit to stand to sit no UE assist x 10  Supine heel slide 5 sec hold Rt leg x 10 AROM  Neuro Re-ed Anterior/posterior weight shift ankles 2 mins   Manual: Seated Rt knee flexion , distraction and IR mobilization c movement for flexion mobility gains.   Vaso 10 mins Rt leg medium compression 34 degrees in elevation.    DATE: 10/29/22 See HEP - performed trial reps with mod cues for comprehension      PATIENT EDUCATION:  Education details: HEP Person educated: Patient Education method: Explanation, Demonstration, and Handouts Education comprehension: verbalized understanding, returned demonstration, and needs further education   HOME EXERCISE PROGRAM: Access Code: [email or web address] URL: [email or web address] Date: 10/29/2022 Prepared by: Faustino Congress   Exercises - Quad Set  - 5-10 x daily - 7 x weekly - 1 sets - 5-10 reps - 5 sec hold - Supine Heel Slide with Strap  - 5-10 x daily - 7 x weekly - 1 sets - 5-10 reps - 5 sec hold - Seated Knee Flexion AAROM  - 3-5 x daily - 7 x weekly - 1 sets - 10 reps - 10 sec hold - Seated Knee Extension AROM  - 3-5 x daily - 7 x weekly - 1 sets - 10 reps - 5 sec hold - Seated Straight Leg Raise  - 3-5 x daily - 7 x weekly - 1 sets - 10 reps - 5 sec hold   ASSESSMENT:   CLINICAL IMPRESSION: Pt tolerated session well today with slight improvement in flexion to 100 deg noted today.  Compliant surface activities increased difficulty to maintain balance.  Will continue to benefit from PT to maximize function.   OBJECTIVE IMPAIRMENTS: Abnormal gait, decreased activity tolerance, decreased balance, decreased knowledge of use of DME, decreased mobility, difficulty walking, decreased  ROM, decreased strength, increased edema, increased fascial restrictions, increased muscle spasms, impaired flexibility, and pain.    ACTIVITY LIMITATIONS: carrying, lifting, standing, sleeping, stairs, transfers, and locomotion level   PARTICIPATION LIMITATIONS: cleaning, driving, community activity, occupation, and yard work   PERSONAL FACTORS: 3+ comorbidities: Morbid obesity, OA, HTN, OSA  are also affecting patient's functional outcome.    REHAB POTENTIAL: Good   CLINICAL DECISION MAKING: Evolving/moderate complexity   EVALUATION COMPLEXITY: Moderate     GOALS: Goals reviewed with patient? Yes   SHORT TERM GOALS: Target date: 11/26/2022   Independent with initial HEP Goal status: on going - 11/01/2022   2.  Rt knee AROM 0-100 deg for improved function and mobility Goal status: on going - 11/01/2022   LONG TERM GOALS: Target date: 12/24/2022   Independent with final HEP Goal status: INITIAL   2.  FOTO score improved to 75 Goal status: INITIAL   3.  Rt knee AROM improved to 0-110 for improved function Goal status: INIITAL   4.  Report pain < 3/10 with standing and walking activities for improved function Goal status: INITIAL   5.  Amb without AD without significant deviations for improved mobility Goal status: INITIAL       PLAN:   PT FREQUENCY: 2x/week   PT DURATION: 8 weeks   PLANNED INTERVENTIONS: Therapeutic exercises, Therapeutic activity, Neuromuscular re-education, Balance  training, Gait training, Patient/Family education, Self Care, Joint mobilization, Stair training, DME instructions, Aquatic Therapy, Dry Needling, Electrical stimulation, Cryotherapy, Moist heat, Taping, Vasopneumatic device, Manual therapy, and Re-evaluation   PLAN FOR NEXT SESSION: continue manual PRN for ROM, strengthening/balance,  Stair low height    Laureen Abrahams, PT, DPT 11/08/22 12:27 PM

## 2022-11-12 ENCOUNTER — Encounter: Payer: Self-pay | Admitting: Physical Therapy

## 2022-11-12 ENCOUNTER — Ambulatory Visit: Payer: 59 | Admitting: Physical Therapy

## 2022-11-12 DIAGNOSIS — R2689 Other abnormalities of gait and mobility: Secondary | ICD-10-CM

## 2022-11-12 DIAGNOSIS — R2681 Unsteadiness on feet: Secondary | ICD-10-CM

## 2022-11-12 DIAGNOSIS — M25661 Stiffness of right knee, not elsewhere classified: Secondary | ICD-10-CM

## 2022-11-12 DIAGNOSIS — R600 Localized edema: Secondary | ICD-10-CM

## 2022-11-12 DIAGNOSIS — M25561 Pain in right knee: Secondary | ICD-10-CM | POA: Diagnosis not present

## 2022-11-12 NOTE — Therapy (Signed)
OUTPATIENT PHYSICAL THERAPY TREATMENT NOTE   Patient Name: Charles Morales MRN: [id number] DOB:1961/10/05, 61 y.o., male Today's Date: 11/12/2022  PCP: Loura Pardon, MD  REFERRING PROVIDER: Mcarthur Rossetti, MD    END OF SESSION:   PT End of Session - 11/12/22 1015     Visit Number 5    Number of Visits 16    Date for PT Re-Evaluation 12/24/22    Authorization Type [employer] $25 copay, 60 visit limit    PT Start Time 1010    PT Stop Time 1100    PT Time Calculation (min) 50 min    Activity Tolerance Patient tolerated treatment well    Behavior During Therapy WFL for tasks assessed/performed                Past Medical History:  Diagnosis Date   Allergy    Arthritis    right knee   Esophageal reflux    Family history of breast cancer    Family history of genetic disease carrier    sister NBN pathogenic variant   Family history of pancreatic cancer    Fatty liver    HLD (hyperlipidemia)    HTN (hypertension)    Morbid obesity ([place])    Pre-diabetes    Sleep apnea    USES CPAP    Tobacco use disorder    Unspecified sleep apnea    Past Surgical History:  Procedure Laterality Date   COLONOSCOPY  2013   COLONOSCOPY WITH PROPOFOL N/A 05/20/2018   Procedure: COLONOSCOPY WITH PROPOFOL;  Surgeon: Jerene Bears, MD;  Location: [facility];  Service: Gastroenterology;  Laterality: N/A;   KNEE ARTHROSCOPY     left   TOTAL KNEE ARTHROPLASTY Right 10/12/2022   Procedure: RIGHT TOTAL KNEE ARTHROPLASTY;  Surgeon: Mcarthur Rossetti, MD;  Location: [facility];  Service: Orthopedics;  Laterality: Right;   Patient Active Problem List   Diagnosis Date Noted   Status post total right knee replacement 10/12/2022   Current use of proton pump inhibitor 11/20/2021   Genetic testing 09/15/2018   Family history of genetic disease carrier 09/02/2018   Family history of breast cancer    Family history of pancreatic cancer    Prostate cancer screening 08/17/2018   History  of colonic polyps    Prediabetes 04/01/2016   Left ankle pain 10/14/2015   PVC's (premature ventricular contractions) 10/22/2013   Colon cancer screening 05/23/2012   Routine general medical examination at a health care facility 05/20/2012   Knee pain, right 06/08/2011   G E R D 08/04/2007   Hyperlipidemia 07/22/2007   Morbid obesity ([place]) 07/22/2007   SMOKELESS TOBACCO ABUSE 07/22/2007   Essential hypertension 07/22/2007   Sleep apnea 07/22/2007    REFERRING DIAG: Z96.651 (ICD-10-CM) - Status post total right knee replacement    THERAPY DIAG:  Acute pain of right knee  Stiffness of right knee, not elsewhere classified  Localized edema  Unsteadiness on feet  Other abnormalities of gait and mobility  Rationale for Evaluation and Treatment Rehabilitation  PERTINENT HISTORY: Morbid obesity, OA, HTN, OSA  PRECAUTIONS: None  SUBJECTIVE:    SUBJECTIVE STATEMENT: Came today without cane; cold weather makes knee feel stiff and tight   PAIN:  NPRS scale: 1.5/10 pain Pain location: Rt knee Pain description: tightness, aching, sore Aggravating factors: bending knee, prolonged positioning Relieving factors: ice, walking    WEIGHT BEARING RESTRICTIONS: No   FALLS:  Has patient fallen in last 6 months? 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)   PLOF: Independent and Leisure: work, walking on treadmill occasionally, play golf   PATIENT GOALS: improve pain, regain use of RLE, return to work, play golf     OBJECTIVE:    PATIENT SURVEYS:  10/29/22: FOTO 63 (predicted 75)   EDEMA:  10/29/22 Circumferential: Knee Joint Line: Rt: 47.7 cm  /Lt: 44.5 cm   LOWER EXTREMITY ROM:    Active ROM Right 10/29/2022 Right 11/05/2022 Right 11/08/22 Right 11/12/22  Knee flexion 79 98 in supine heel slide AROM  100 AA: 102 A: 100 AA: 104  Knee extension -3 (seated LAQ)  -3 in seated LAQ AROM -2 (seated LAQ)    (Blank rows = not tested)     Passive ROM Right 10/29/2022  Knee flexion 90  Knee extension -1   (Blank rows = not tested)       LOWER EXTREMITY MMT:   MMT Right 10/29/2022  Knee flexion 3-/5  Knee extension 3-/5   (Blank rows = not tested)   FUNCTIONAL TESTS:  10/29/2022 Deferred today   GAIT: 10/29/2022 Distance walked: 100' Assistive device utilized: Lobbyist Level of assistance: Modified independence Comments: step to pattern, decr stance on Rt; decr hip/knee flexion     TODAY'S TREATMENT:                                                                                                                             11/12/22 Therex: Recumbent bike seat 8 partial rev to full rev x 8 mins Leg press Double leg 100 lbs 3x10 (increase next visit), RLE only 100# 3x10 Seated LAQ 5# with 3 sec hold 3x10; Rt AA heel slides x 10 reps on Rt ROM measurements RLE only knee extension 10# 3x10 RLE only knee flexion 20# 3x10  Modalities Vaso Rt knee x 10 min, mod pressure; 34 deg   11/08/22 Therex: Recumbent bike seat 8 partial rev to full rev x 8 mins Leg press Double leg 100 lbs 3x10, RLE only 50# 3x10 Seated LAQ 5# with 3 sec hold 3x10; Rt Rt AA heel slide x 10 reps Sit to/from stand x 15 reps without UE support  Neuro Re-Ed Side stepping on balance beam in // bars with intermittent UE support x 5 laps Tandem stance x 2 bil on balance beam; 20 sec with intermittent UE support Tandem walking x 3 laps with UE support on balance beam  Modalities Vaso Rt knee x 10 min, mod pressure; 34 deg  11/05/2022 Therex: Recumbent bike seat 8 partial rev to full rev 8 mins Leg press Double leg 62 lbs x 15, single leg 31 lbs x 15 Incline  Gastroc  stretch 30 sec x 3 bilateral  Seated LAQ 3 lbs 2 x 15 c pause in each end range Seated SLR 2 x 10  Supine AROM heel slide 5 sec hold x 10    Neuro Re-ed Tandem stance 1 min x 2 bilateral occasional hand assist (will need foam next time) Retro step x 15 Rt leg posterior   Vaso 10 mins Rt leg medium compression 34 degrees in elevation.   11/01/2022 Therex: Nustep Lvl 5 LE only 8 mins , seat 11 Incline Gastroc stretch 30 sec x 5 bilateral  Seated Rt knee LAQ c end range pauses in both directions c contralateral movement opposite x 15, 2 lb x 15 Seated quad set c SLR 2 x 10 Rt Sit to stand to sit no UE assist x 10  Supine heel slide 5 sec hold Rt leg x 10 AROM  Neuro Re-ed Anterior/posterior weight shift ankles 2 mins   Manual: Seated Rt knee flexion , distraction and IR mobilization c movement for flexion mobility gains.   Vaso 10 mins Rt leg medium compression 34 degrees in elevation.    DATE: 10/29/22 See HEP - performed trial reps with mod cues for comprehension      PATIENT EDUCATION:  Education details: HEP Person educated: Patient Education method: Explanation, Demonstration, and Handouts Education comprehension: verbalized understanding, returned demonstration, and needs further education   HOME EXERCISE PROGRAM: Access Code: [email or web address] URL: [email or web address] Date: 10/29/2022 Prepared by: Faustino Congress   Exercises - Quad Set  - 5-10 x daily - 7 x weekly - 1 sets - 5-10 reps - 5 sec hold - Supine Heel Slide with Strap  - 5-10 x daily - 7 x weekly - 1 sets - 5-10 reps - 5 sec hold - Seated Knee Flexion AAROM  - 3-5 x daily - 7 x weekly - 1 sets - 10 reps - 10 sec hold - Seated Knee Extension AROM  - 3-5 x daily - 7 x weekly - 1 sets - 10 reps - 5 sec hold - Seated Straight Leg Raise  - 3-5 x daily - 7 x weekly - 1 sets - 10 reps - 5 sec hold   ASSESSMENT:   CLINICAL IMPRESSION: Pt with continued improvement in flexion between  sessions, and overall progressing well with PT.  Amb more and more without a device safely at this time.  Will continue to benefit from PT to maximize function.   OBJECTIVE IMPAIRMENTS: Abnormal gait, decreased activity tolerance, decreased balance, decreased knowledge of use of DME, decreased mobility, difficulty walking, decreased ROM, decreased strength, increased edema, increased fascial restrictions, increased muscle spasms, impaired flexibility, and pain.    ACTIVITY LIMITATIONS: carrying, lifting, standing, sleeping, stairs, transfers, and locomotion level   PARTICIPATION LIMITATIONS: cleaning, driving, community activity, occupation, and yard work   PERSONAL FACTORS: 3+ comorbidities: Morbid obesity, OA, HTN, OSA  are also affecting patient's functional outcome.    REHAB POTENTIAL: Good   CLINICAL DECISION MAKING: Evolving/moderate complexity   EVALUATION COMPLEXITY: Moderate     GOALS: Goals reviewed with patient? Yes   SHORT TERM GOALS: Target date: 11/26/2022   Independent with initial HEP Goal status: on going - 11/01/2022   2.  Rt knee AROM 0-100 deg for improved function and mobility Goal status: on going - 11/01/2022   LONG TERM GOALS: Target date: 12/24/2022   Independent with final HEP Goal status: INITIAL   2.  FOTO score improved to 75 Goal status: INITIAL   3.  Rt knee AROM improved to 0-110 for improved function Goal status: INIITAL   4.  Report pain < 3/10 with standing and walking activities for improved function Goal status: INITIAL   5.  Amb without AD without significant deviations for improved mobility Goal status: INITIAL       PLAN:   PT FREQUENCY: 2x/week   PT DURATION: 8 weeks   PLANNED INTERVENTIONS: Therapeutic exercises, Therapeutic activity, Neuromuscular re-education, Balance training, Gait training, Patient/Family education, Self Care, Joint mobilization, Stair training, DME instructions, Aquatic Therapy, Dry Needling, Electrical  stimulation, Cryotherapy, Moist heat, Taping, Vasopneumatic device, Manual therapy, and Re-evaluation   PLAN FOR NEXT SESSION: continue manual PRN for ROM, strengthening/balance,  Stair low height    Laureen Abrahams, PT, DPT 11/12/22 11:02 AM

## 2022-11-15 ENCOUNTER — Encounter: Payer: Self-pay | Admitting: Rehabilitative and Restorative Service Providers"

## 2022-11-15 ENCOUNTER — Ambulatory Visit (INDEPENDENT_AMBULATORY_CARE_PROVIDER_SITE_OTHER): Payer: 59 | Admitting: Rehabilitative and Restorative Service Providers"

## 2022-11-15 DIAGNOSIS — R2681 Unsteadiness on feet: Secondary | ICD-10-CM

## 2022-11-15 DIAGNOSIS — M25561 Pain in right knee: Secondary | ICD-10-CM | POA: Diagnosis not present

## 2022-11-15 DIAGNOSIS — M25661 Stiffness of right knee, not elsewhere classified: Secondary | ICD-10-CM

## 2022-11-15 DIAGNOSIS — R2689 Other abnormalities of gait and mobility: Secondary | ICD-10-CM

## 2022-11-15 DIAGNOSIS — R600 Localized edema: Secondary | ICD-10-CM | POA: Diagnosis not present

## 2022-11-15 NOTE — Therapy (Signed)
OUTPATIENT PHYSICAL THERAPY TREATMENT NOTE   Patient Name: Charles Morales MRN: [id number] DOB:1961-08-17, 61 y.o., male Today's Date: 11/15/2022  PCP: Loura Pardon, MD  REFERRING PROVIDER: Mcarthur Rossetti, MD    END OF SESSION:   PT End of Session - 11/15/22 1011     Visit Number 6    Number of Visits 16    Date for PT Re-Evaluation 12/24/22    Authorization Type [employer] $25 copay, 60 visit limit    Progress Note Due on Visit 10    PT Start Time 1011    PT Stop Time 1101    PT Time Calculation (min) 50 min    Activity Tolerance Patient tolerated treatment well    Behavior During Therapy WFL for tasks assessed/performed                 Past Medical History:  Diagnosis Date   Allergy    Arthritis    right knee   Esophageal reflux    Family history of breast cancer    Family history of genetic disease carrier    sister NBN pathogenic variant   Family history of pancreatic cancer    Fatty liver    HLD (hyperlipidemia)    HTN (hypertension)    Morbid obesity ([place])    Pre-diabetes    Sleep apnea    USES CPAP    Tobacco use disorder    Unspecified sleep apnea    Past Surgical History:  Procedure Laterality Date   COLONOSCOPY  2013   COLONOSCOPY WITH PROPOFOL N/A 05/20/2018   Procedure: COLONOSCOPY WITH PROPOFOL;  Surgeon: Jerene Bears, MD;  Location: [facility];  Service: Gastroenterology;  Laterality: N/A;   KNEE ARTHROSCOPY     left   TOTAL KNEE ARTHROPLASTY Right 10/12/2022   Procedure: RIGHT TOTAL KNEE ARTHROPLASTY;  Surgeon: Mcarthur Rossetti, MD;  Location: [facility];  Service: Orthopedics;  Laterality: Right;   Patient Active Problem List   Diagnosis Date Noted   Status post total right knee replacement 10/12/2022   Current use of proton pump inhibitor 11/20/2021   Genetic testing 09/15/2018   Family history of genetic disease carrier 09/02/2018   Family history of breast cancer    Family history of pancreatic cancer    Prostate  cancer screening 08/17/2018   History of colonic polyps    Prediabetes 04/01/2016   Left ankle pain 10/14/2015   PVC's (premature ventricular contractions) 10/22/2013   Colon cancer screening 05/23/2012   Routine general medical examination at a health care facility 05/20/2012   Knee pain, right 06/08/2011   G E R D 08/04/2007   Hyperlipidemia 07/22/2007   Morbid obesity ([place]) 07/22/2007   SMOKELESS TOBACCO ABUSE 07/22/2007   Essential hypertension 07/22/2007   Sleep apnea 07/22/2007    REFERRING DIAG: Z96.651 (ICD-10-CM) - Status post total right knee replacement    THERAPY DIAG:  Acute pain of right knee  Stiffness of right knee, not elsewhere classified  Localized edema  Unsteadiness on feet  Other abnormalities of gait and mobility  Rationale for Evaluation and Treatment Rehabilitation  PERTINENT HISTORY: Morbid obesity, OA, HTN, OSA  PRECAUTIONS: None  SUBJECTIVE:    SUBJECTIVE STATEMENT: Pt indicated he woke up pretty good today but got stiffer as morning went.  Pt indicated soreness after last visit but nothing major.    PAIN:  NPRS scale: upon arrival this morning 2/10 Pain location: Rt knee Pain description: tightness, aching, sore Aggravating factors: bending knee, prolonged  positioning Relieving factors: ice, walking    WEIGHT BEARING RESTRICTIONS: No   FALLS:  Has patient fallen in last 6 months? 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)   PLOF: Independent and Leisure: work, walking on treadmill occasionally, play golf   PATIENT GOALS: improve pain, regain use of RLE, return to work, play golf     OBJECTIVE:    [place]:  10/29/22: FOTO 63  (predicted 75)   [place]:  10/29/22 Circumferential: Knee Joint Line: Rt: 47.7 cm  /Lt: 44.5 cm   LOWER EXTREMITY ROM:   Active ROM Right 10/29/2022 Right 11/05/2022 Right 11/08/22 Right 11/12/22  Knee flexion 79 98 in supine heel slide AROM  100 AA: 102 A: 100 AA: 104  Knee extension -3 (seated LAQ)  -3 in seated LAQ AROM -2 (seated LAQ)    (Blank rows = not tested)     Passive ROM Right 10/29/2022  Knee flexion 90  Knee extension -1   (Blank rows = not tested)       LOWER EXTREMITY MMT:   MMT Right 10/29/2022  Knee flexion 3-/5  Knee extension 3-/5   (Blank rows = not tested)   FUNCTIONAL TESTS:  10/29/2022 Deferred today   GAIT: 10/29/2022 Distance walked: 100' Assistive device utilized: Lobbyist Level of assistance: Modified independence Comments: step to pattern, decr stance on Rt; decr hip/knee flexion     TODAY'S TREATMENT:   11/15/2022: Therex: Recumbent bike seat 8 partial rev to full rev x 10 mins (longer time today due to tightness upon arrival) Leg press Double leg 100 lbs 3x10 (increase next visit), RLE only 100# 3x10 Fwd step up Rt leg WB 4 inch 2 x 10 Knee extension double leg up Rt leg lowering slowly 1-2 seconds 10 lbs 2 x 10 (reduced amount due to arrival presentation RLE only knee flexion 20# 3x10  Neuro re-ed Tandem stance on foam 1 min x 1 bilateral c occasional hand assist 4 inch step over clear with flexion focus and forward and reverse weight shift on stance leg x 10 bilateral   Modalities Vaso Rt knee x 10 min, medium pressure; 34 deg in elevation                                                                                                                             11/12/2022 Therex: Recumbent bike seat 8 partial rev to full rev x 8 mins Leg press Double leg 100 lbs 3x10 (increase next visit), RLE only 100# 3x10 Seated LAQ 5# with 3 sec hold 3x10; Rt AA heel slides x 10 reps on Rt ROM measurements RLE only  knee extension 10# 3x10 RLE  only knee flexion 20# 3x10  Modalities Vaso Rt knee x 10 min, mod pressure; 34 deg   11/08/2022 Therex: Recumbent bike seat 8 partial rev to full rev x 8 mins Leg press Double leg 100 lbs 3x10, RLE only 50# 3x10 Seated LAQ 5# with 3 sec hold 3x10; Rt Rt AA heel slide x 10 reps Sit to/from stand x 15 reps without UE support  Neuro Re-Ed Side stepping on balance beam in // bars with intermittent UE support x 5 laps Tandem stance x 2 bil on balance beam; 20 sec with intermittent UE support Tandem walking x 3 laps with UE support on balance beam  Modalities Vaso Rt knee x 10 min, mod pressure; 34 deg  11/05/2022 Therex: Recumbent bike seat 8 partial rev to full rev 8 mins Leg press Double leg 62 lbs x 15, single leg 31 lbs x 15 Incline Gastroc stretch 30 sec x 3 bilateral  Seated LAQ 3 lbs 2 x 15 c pause in each end range Seated SLR 2 x 10  Supine AROM heel slide 5 sec hold x 10    Neuro Re-ed Tandem stance 1 min x 2 bilateral occasional hand assist (will need foam next time) Retro step x 15 Rt leg posterior   Vaso 10 mins Rt leg medium compression 34 degrees in elevation.     PATIENT EDUCATION:  Education details: HEP Person educated: Patient Education method: Consulting civil engineer, Media planner, and Handouts Education comprehension: verbalized understanding, returned demonstration, and needs further education   HOME EXERCISE PROGRAM: Access Code: [email or web address] URL: [email or web address] Date: 10/29/2022 Prepared by: Faustino Congress   Exercises - Quad Set  - 5-10 x daily - 7 x weekly - 1 sets - 5-10 reps - 5 sec hold - Supine Heel Slide with Strap  - 5-10 x daily - 7 x weekly - 1 sets - 5-10 reps - 5 sec hold - Seated Knee Flexion AAROM  - 3-5 x daily - 7 x weekly - 1 sets - 10 reps - 10 sec hold - Seated Knee Extension AROM  - 3-5 x daily - 7 x weekly - 1 sets - 10 reps - 5 sec hold - Seated Straight Leg Raise  - 3-5 x daily - 7 x  weekly - 1 sets - 10 reps - 5 sec hold   ASSESSMENT:   CLINICAL IMPRESSION: Adjusted intervention in parts today to adapt to soreness complaints/tightness upon arrival.  Continued strengthening and mobility gains in HEP and skilled PT services to continue to help Pt reach goals.  Well done today on low curb height.    OBJECTIVE IMPAIRMENTS: Abnormal gait, decreased activity tolerance, decreased balance, decreased knowledge of use of DME, decreased mobility, difficulty walking, decreased ROM, decreased strength, increased edema, increased fascial restrictions, increased muscle spasms, impaired flexibility, and pain.    ACTIVITY LIMITATIONS: carrying, lifting, standing, sleeping, stairs, transfers, and locomotion level   PARTICIPATION LIMITATIONS: cleaning, driving, community activity, occupation, and yard work   PERSONAL FACTORS: 3+ comorbidities: Morbid obesity, OA, HTN, OSA  are also affecting patient's functional outcome.    REHAB POTENTIAL: Good   CLINICAL DECISION MAKING: Evolving/moderate complexity   EVALUATION COMPLEXITY: Moderate     GOALS: Goals reviewed with patient? Yes   SHORT TERM GOALS: Target date: 11/26/2022   Independent with initial HEP Goal status: on going - 11/01/2022   2.  Rt knee AROM 0-100 deg for improved function and mobility Goal status: on going - 11/01/2022   LONG TERM GOALS: Target date:  12/24/2022   Independent with final HEP Goal status: INITIAL   2.  FOTO score improved to 75 Goal status: INITIAL   3.  Rt knee AROM improved to 0-110 for improved function Goal status: INIITAL   4.  Report pain < 3/10 with standing and walking activities for improved function Goal status: INITIAL   5.  Amb without AD without significant deviations for improved mobility Goal status: INITIAL       PLAN:   PT FREQUENCY: 2x/week   PT DURATION: 8 weeks   PLANNED INTERVENTIONS: Therapeutic exercises, Therapeutic activity, Neuromuscular re-education,  Balance training, Gait training, Patient/Family education, Self Care, Joint mobilization, Stair training, DME instructions, Aquatic Therapy, Dry Needling, Electrical stimulation, Cryotherapy, Moist heat, Taping, Vasopneumatic device, Manual therapy, and Re-evaluation   PLAN FOR NEXT SESSION:  Continued strengthening and balance improvements.  MD visit next week   Scot Jun, PT, DPT, OCS, ATC 11/15/22  10:55 AM

## 2022-11-19 ENCOUNTER — Encounter: Payer: Self-pay | Admitting: Physical Therapy

## 2022-11-19 ENCOUNTER — Ambulatory Visit (INDEPENDENT_AMBULATORY_CARE_PROVIDER_SITE_OTHER): Payer: 59 | Admitting: Physical Therapy

## 2022-11-19 DIAGNOSIS — R2681 Unsteadiness on feet: Secondary | ICD-10-CM

## 2022-11-19 DIAGNOSIS — M25561 Pain in right knee: Secondary | ICD-10-CM | POA: Diagnosis not present

## 2022-11-19 DIAGNOSIS — M25661 Stiffness of right knee, not elsewhere classified: Secondary | ICD-10-CM

## 2022-11-19 DIAGNOSIS — R600 Localized edema: Secondary | ICD-10-CM | POA: Diagnosis not present

## 2022-11-19 DIAGNOSIS — R2689 Other abnormalities of gait and mobility: Secondary | ICD-10-CM

## 2022-11-19 NOTE — Therapy (Signed)
OUTPATIENT PHYSICAL THERAPY TREATMENT NOTE   Patient Name: Charles Morales MRN: [id number] DOB:1961-06-26, 61 y.o., male Today's Date: 11/19/2022  PCP: Loura Pardon, MD  REFERRING PROVIDER: Mcarthur Rossetti, MD    END OF SESSION:   PT End of Session - 11/19/22 1010     Visit Number 7    Number of Visits 16    Date for PT Re-Evaluation 12/24/22    Authorization Type [employer] $25 copay, 60 visit limit    Progress Note Due on Visit 10    PT Start Time 1010    PT Stop Time 1052    PT Time Calculation (min) 42 min    Activity Tolerance Patient tolerated treatment well    Behavior During Therapy WFL for tasks assessed/performed                  Past Medical History:  Diagnosis Date   Allergy    Arthritis    right knee   Esophageal reflux    Family history of breast cancer    Family history of genetic disease carrier    sister NBN pathogenic variant   Family history of pancreatic cancer    Fatty liver    HLD (hyperlipidemia)    HTN (hypertension)    Morbid obesity ([place])    Pre-diabetes    Sleep apnea    USES CPAP    Tobacco use disorder    Unspecified sleep apnea    Past Surgical History:  Procedure Laterality Date   COLONOSCOPY  2013   COLONOSCOPY WITH PROPOFOL N/A 05/20/2018   Procedure: COLONOSCOPY WITH PROPOFOL;  Surgeon: Jerene Bears, MD;  Location: [facility];  Service: Gastroenterology;  Laterality: N/A;   KNEE ARTHROSCOPY     left   TOTAL KNEE ARTHROPLASTY Right 10/12/2022   Procedure: RIGHT TOTAL KNEE ARTHROPLASTY;  Surgeon: Mcarthur Rossetti, MD;  Location: [facility];  Service: Orthopedics;  Laterality: Right;   Patient Active Problem List   Diagnosis Date Noted   Status post total right knee replacement 10/12/2022   Current use of proton pump inhibitor 11/20/2021   Genetic testing 09/15/2018   Family history of genetic disease carrier 09/02/2018   Family history of breast cancer    Family history of pancreatic cancer    Prostate  cancer screening 08/17/2018   History of colonic polyps    Prediabetes 04/01/2016   Left ankle pain 10/14/2015   PVC's (premature ventricular contractions) 10/22/2013   Colon cancer screening 05/23/2012   Routine general medical examination at a health care facility 05/20/2012   Knee pain, right 06/08/2011   G E R D 08/04/2007   Hyperlipidemia 07/22/2007   Morbid obesity ([place]) 07/22/2007   SMOKELESS TOBACCO ABUSE 07/22/2007   Essential hypertension 07/22/2007   Sleep apnea 07/22/2007    REFERRING DIAG: Z96.651 (ICD-10-CM) - Status post total right knee replacement    THERAPY DIAG:  Acute pain of right knee  Localized edema  Unsteadiness on feet  Stiffness of right knee, not elsewhere classified  Other abnormalities of gait and mobility  Rationale for Evaluation and Treatment Rehabilitation  PERTINENT HISTORY: Morbid obesity, OA, HTN, OSA  PRECAUTIONS: None  SUBJECTIVE:    SUBJECTIVE STATEMENT: Only having stiffness, pain is very mild.   PAIN:  NPRS scale: upon arrival this morning 2/10 Pain location: Rt knee Pain description: tightness, aching, sore Aggravating factors: bending knee, prolonged positioning Relieving factors: ice, walking    WEIGHT BEARING RESTRICTIONS: No   FALLS:  Has  patient fallen in last 6 months? 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)   PLOF: Independent and Leisure: work, walking on treadmill occasionally, play golf   PATIENT GOALS: improve pain, regain use of RLE, return to work, play golf     OBJECTIVE:    [place]:  10/29/22: FOTO 63 (predicted 75)   [place]:  10/29/22 Circumferential: Knee Joint Line: Rt: 47.7 cm  /Lt: 44.5  cm   LOWER EXTREMITY ROM:   Active ROM Right 10/29/2022 Right 11/05/2022 Right 11/08/22 Right 11/12/22  Knee flexion 79 98 in supine heel slide AROM  100 AA: 102 A: 100 AA: 104  Knee extension -3 (seated LAQ)  -3 in seated LAQ AROM -2 (seated LAQ)    (Blank rows = not tested)     Passive ROM Right 10/29/2022  Knee flexion 90  Knee extension -1   (Blank rows = not tested)       LOWER EXTREMITY MMT:   MMT Right 10/29/2022  Knee flexion 3-/5  Knee extension 3-/5   (Blank rows = not tested)   FUNCTIONAL TESTS:  10/29/2022 Deferred today   GAIT: 10/29/2022 Distance walked: 100' Assistive device utilized: Lobbyist Level of assistance: Modified independence Comments: step to pattern, decr stance on Rt; decr hip/knee flexion     TODAY'S TREATMENT:   11/19/22 Therex: Recumbent bike seat 8; full rev x 10 mins Leg Press 125# 3x10; then RLE only 75# 3x10 Lateral heel taps with RLE on 4" step 3x10 RDL 15# KB 3x10  Neuro re-ed Tandem stance on foam 3x30 sec bilateral c occasional hand assist SLS on foam 3x15 sec bil; UE support needed SLS on RLE with Lt taps to Blaze Pods (3 pods, 30 sec on, 30 sec rest x 5 cycles)  11/15/2022: Therex: Recumbent bike seat 8 partial rev to full rev x 10 mins (longer time today due to tightness upon arrival) Leg press Double leg 100 lbs 3x10 (increase next visit), RLE only 100# 3x10 Fwd step up Rt leg WB 4 inch 2 x 10 Knee extension double leg up Rt leg lowering slowly 1-2 seconds 10 lbs 2 x 10 (reduced amount due to arrival presentation RLE only knee flexion 20# 3x10  Neuro re-ed Tandem stance on foam 1 min x 1 bilateral c occasional hand assist 4 inch step over clear with flexion focus and forward and reverse weight shift on stance leg x 10 bilateral   Modalities Vaso Rt knee x 10 min, medium pressure; 34 deg in elevation  11/12/2022 Therex: Recumbent bike seat 8 partial rev to full rev x 8 mins Leg press Double leg 100 lbs 3x10 (increase next visit), RLE only 100# 3x10 Seated LAQ 5# with 3 sec hold 3x10; Rt AA heel slides x 10 reps on Rt ROM measurements RLE only knee extension 10# 3x10 RLE only knee flexion 20# 3x10  Modalities Vaso Rt knee x 10 min, mod pressure; 34 deg     PATIENT EDUCATION:  Education details: HEP Person educated: Patient Education method: Consulting civil engineer, Media planner, and Handouts Education comprehension: verbalized understanding, returned demonstration, and needs further education   HOME EXERCISE PROGRAM: Access Code: [email or web address] URL: [email or web address] Date: 10/29/2022 Prepared by: Faustino Congress   Exercises - Quad Set  - 5-10 x daily - 7 x weekly - 1 sets - 5-10 reps - 5 sec hold - Supine Heel Slide with Strap  - 5-10 x daily - 7 x weekly - 1 sets - 5-10 reps - 5 sec hold - Seated Knee Flexion AAROM  - 3-5 x daily - 7 x weekly - 1 sets - 10 reps - 10 sec hold - Seated Knee Extension AROM  - 3-5 x daily - 7 x weekly - 1 sets - 10 reps - 5 sec hold - Seated Straight Leg Raise  - 3-5 x daily - 7 x weekly - 1 sets - 10 reps - 5 sec hold   ASSESSMENT:   CLINICAL IMPRESSION: Pt tolerated session well today and overall pain very well managed.  He sees the MD after next PT appt so plan to update measurements and get FOTO.  Pt likely ready for d/c next week, but will see what MD says and if he has any concerns.     OBJECTIVE IMPAIRMENTS: Abnormal gait, decreased activity tolerance, decreased balance, decreased knowledge of use of DME, decreased mobility, difficulty walking, decreased ROM, decreased strength, increased edema, increased fascial restrictions, increased muscle spasms, impaired flexibility, and pain.    ACTIVITY LIMITATIONS: carrying, lifting, standing, sleeping, stairs, transfers, and locomotion level   PARTICIPATION  LIMITATIONS: cleaning, driving, community activity, occupation, and yard work   PERSONAL FACTORS: 3+ comorbidities: Morbid obesity, OA, HTN, OSA  are also affecting patient's functional outcome.    REHAB POTENTIAL: Good   CLINICAL DECISION MAKING: Evolving/moderate complexity   EVALUATION COMPLEXITY: Moderate     GOALS: Goals reviewed with patient? Yes   SHORT TERM GOALS: Target date: 11/26/2022   Independent with initial HEP Goal status: on going - 11/01/2022   2.  Rt knee AROM 0-100 deg for improved function and mobility Goal status: on going - 11/01/2022   LONG TERM GOALS: Target date: 12/24/2022   Independent with final HEP Goal status: INITIAL   2.  FOTO score improved to 75 Goal status: INITIAL   3.  Rt knee AROM improved to 0-110 for improved function Goal status: INIITAL   4.  Report pain < 3/10 with standing and walking activities for improved function Goal status: INITIAL   5.  Amb without AD without significant deviations for improved mobility Goal status: INITIAL       PLAN:   PT FREQUENCY: 2x/week   PT DURATION: 8 weeks   PLANNED INTERVENTIONS: Therapeutic exercises, Therapeutic activity, Neuromuscular re-education, Balance training, Gait training, Patient/Family education, Self Care, Joint mobilization, Stair training, DME instructions, Aquatic Therapy, Dry Needling, Electrical stimulation, Cryotherapy, Moist heat, Taping, Vasopneumatic device, Manual therapy, and Re-evaluation   PLAN FOR NEXT SESSION:  check ROM/FOTO, send MD note.  (If feel pt needs  more PT than next week, go ahead and schedule) Continued strengthening and balance improvements.    Laureen Abrahams, PT, DPT 11/19/22 10:58 AM

## 2022-11-21 ENCOUNTER — Ambulatory Visit (INDEPENDENT_AMBULATORY_CARE_PROVIDER_SITE_OTHER): Payer: 59 | Admitting: Orthopaedic Surgery

## 2022-11-21 ENCOUNTER — Encounter: Payer: Self-pay | Admitting: Rehabilitative and Restorative Service Providers"

## 2022-11-21 ENCOUNTER — Ambulatory Visit (INDEPENDENT_AMBULATORY_CARE_PROVIDER_SITE_OTHER): Payer: 59 | Admitting: Rehabilitative and Restorative Service Providers"

## 2022-11-21 ENCOUNTER — Encounter: Payer: Self-pay | Admitting: Orthopaedic Surgery

## 2022-11-21 DIAGNOSIS — R2681 Unsteadiness on feet: Secondary | ICD-10-CM | POA: Diagnosis not present

## 2022-11-21 DIAGNOSIS — Z96651 Presence of right artificial knee joint: Secondary | ICD-10-CM

## 2022-11-21 DIAGNOSIS — R600 Localized edema: Secondary | ICD-10-CM | POA: Diagnosis not present

## 2022-11-21 DIAGNOSIS — M25661 Stiffness of right knee, not elsewhere classified: Secondary | ICD-10-CM

## 2022-11-21 DIAGNOSIS — R2689 Other abnormalities of gait and mobility: Secondary | ICD-10-CM

## 2022-11-21 DIAGNOSIS — M25561 Pain in right knee: Secondary | ICD-10-CM

## 2022-11-21 NOTE — Progress Notes (Signed)
HPI: Charles Morales returns today for follow-up of his right total knee arthroplasty which was performed on 10/12/2022.  He states overall his range of motion is improving his strength is improving.  He still working with therapy.  Notes he has some mild pain in the knee that is tolerated well with Tylenol and his muscle relaxant.  He does note that he is had a tooth ache that he started some antibiotics for the tooth has quit hurting at this point.  Denies any fevers chills.  Physical exam: Right knee full extension flexion to approximately 115 degrees.  No instability valgus varus stressing.  Surgical incision well-healed without signs of infection.  Dorsiflexion plantarflexion right ankle intact.  Ambulates without any assistive device.  Impression: Status post right total knee arthroplasty  Plan: He will continue work with therapy on range of motion and strengthening.  He is out of work until December 21.  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  He is encouraged to see his dentist soon for the tooth ache.  We will see him back in 1 month questions were encouraged and answered.

## 2022-11-21 NOTE — Therapy (Signed)
OUTPATIENT PHYSICAL THERAPY TREATMENT NOTE /PROGRESS NOTE   Patient Name: Charles Morales MRN: [id number] DOB:[birth year], 61 y.o., male Today's Date: 11/21/2022  PCP: Loura Pardon, MD  REFERRING PROVIDER: Mcarthur Rossetti, MD    Progress Note Reporting Period 10/29/2022 to 11/21/2022  See note below for Objective Data and Assessment of Progress/Goals.      END OF SESSION:   PT End of Session - 11/21/22 0942     Visit Number 8    Number of Visits 16    Date for PT Re-Evaluation 12/24/22    Authorization Type [employer] $25 copay, 60 visit limit    Progress Note Due on Visit 10    PT Start Time 0924    PT Stop Time 1015    PT Time Calculation (min) 51 min    Activity Tolerance Patient tolerated treatment well    Behavior During Therapy WFL for tasks assessed/performed                   Past Medical History:  Diagnosis Date   Allergy    Arthritis    right knee   Esophageal reflux    Family history of breast cancer    Family history of genetic disease carrier    sister NBN pathogenic variant   Family history of pancreatic cancer    Fatty liver    HLD (hyperlipidemia)    HTN (hypertension)    Morbid obesity ([place])    Pre-diabetes    Sleep apnea    USES CPAP    Tobacco use disorder    Unspecified sleep apnea    Past Surgical History:  Procedure Laterality Date   COLONOSCOPY  2013   COLONOSCOPY WITH PROPOFOL N/A 05/20/2018   Procedure: COLONOSCOPY WITH PROPOFOL;  Surgeon: Jerene Bears, MD;  Location: [facility];  Service: Gastroenterology;  Laterality: N/A;   KNEE ARTHROSCOPY     left   TOTAL KNEE ARTHROPLASTY Right 10/12/2022   Procedure: RIGHT TOTAL KNEE ARTHROPLASTY;  Surgeon: Mcarthur Rossetti, MD;  Location: [facility];  Service: Orthopedics;  Laterality: Right;   Patient Active Problem List   Diagnosis Date Noted   Status post total right knee replacement 10/12/2022   Current use of proton pump inhibitor 11/20/2021   Genetic testing  09/15/2018   Family history of genetic disease carrier 09/02/2018   Family history of breast cancer    Family history of pancreatic cancer    Prostate cancer screening 08/17/2018   History of colonic polyps    Prediabetes 04/01/2016   Left ankle pain 10/14/2015   PVC's (premature ventricular contractions) 10/22/2013   Colon cancer screening 05/23/2012   Routine general medical examination at a health care facility 05/20/2012   Knee pain, right 06/08/2011   G E R D 08/04/2007   Hyperlipidemia 07/22/2007   Morbid obesity ([place]) 07/22/2007   SMOKELESS TOBACCO ABUSE 07/22/2007   Essential hypertension 07/22/2007   Sleep apnea 07/22/2007    REFERRING DIAG: Z96.651 (ICD-10-CM) - Status post total right knee replacement    THERAPY DIAG:  Acute pain of right knee  Localized edema  Unsteadiness on feet  Stiffness of right knee, not elsewhere classified  Other abnormalities of gait and mobility  Rationale for Evaluation and Treatment Rehabilitation  PERTINENT HISTORY: Morbid obesity, OA, HTN, OSA  PRECAUTIONS: None  SUBJECTIVE:    SUBJECTIVE STATEMENT: Pt indicated his wife had to go to ED and he had to do lots of walking/standing and wasn't at home doing the  normal activity in HEP.  Pt indicated pain in knee around 2/10.   Pt indicated moderately better +4 at this time on global rating of change.   PAIN:  NPRS scale: 2/10 Pain location: Rt knee Pain description: tightness, aching, sore Aggravating factors: bending knee, prolonged positioning Relieving factors: ice, walking    WEIGHT BEARING RESTRICTIONS: No   FALLS:  Has patient fallen in last 6 months? 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)   PLOF: Independent and Leisure: work, walking on treadmill occasionally, play golf   PATIENT GOALS: improve pain, regain use of RLE, return to work, play golf     OBJECTIVE:    PATIENT SURVEYS:  11/21/2022 : FOTO update:  67  10/29/22: FOTO 63 (predicted 75)   EDEMA:  10/29/22 Circumferential: Knee Joint Line: Rt: 47.7 cm  /Lt: 44.5 cm   LOWER EXTREMITY ROM:   Active ROM Right 10/29/2022 Right 11/05/2022 Right 11/08/22 Right 11/12/22 Right  11/21/2022  Knee flexion 79 98 in supine heel slide AROM  100 AA: 102 A: 100 AA: 104 AROM in supine heel slide 106  Knee extension -3 (seated LAQ)  -3 in seated LAQ AROM -2 (seated LAQ)  -2 in seated LAQ   (Blank rows = not tested)     Passive ROM Right 10/29/2022  Knee flexion 90  Knee extension -1   (Blank rows = not tested)       LOWER EXTREMITY MMT:   MMT Right 10/29/2022 Right 11/21/2022 Left 11/21/2022  Knee flexion 3-/5 5/5   Knee extension 3-/5 5/5 49, 52 lbs 5/5 45.8,49.1 lbs   (Blank rows = not tested)   FUNCTIONAL TESTS:  10/29/2022 Deferred today   GAIT: 11/21/2022:  independent c mild lacking TKE in stance, variable step length.   10/29/2022 Distance walked: 100' Assistive device utilized: Lobbyist Level of assistance: Modified independence Comments: step to pattern, decr stance on Rt; decr hip/knee flexion     TODAY'S TREATMENT:   11/21/2022 Therex: Recumbent bike seat 9; full rev x 11 mins (additional time due to tightness upon arrival today) Leg Press 125# 2 x10; then RLE only 75# 2 x 15 Incline gastroc stretch 30 sec x 5 bilateral Seated LAQ c pauses in end range each way x 15 Supine AROM heel slide x 5 Knee extension double leg up Rt leg lowering slowly 1-2 seconds 15 lbs 2 x 10 (reduced amount due to arrival presentation  Neuro re-ed Tandem stance on foam 1 min x  2 bilateral  Modalities Vaso Rt knee x 10 min, medium pressure; 34 deg in  elevation  11/19/22 Therex: Recumbent bike seat 8; full rev x 10 mins Leg Press 125# 3x10; then RLE only 75# 3x10 Lateral heel taps with RLE on 4" step 3x10 RDL 15# KB 3x10  Neuro re-ed Tandem stance on foam 3x30 sec bilateral c occasional hand assist SLS on foam 3x15 sec bil; UE support needed SLS on RLE with Lt taps to Blaze Pods (3 pods, 30 sec on, 30 sec rest x 5 cycles)  11/15/2022: Therex: Recumbent bike seat 8 partial rev to full rev x 10 mins (longer time today due to tightness upon arrival) Leg press Double leg  100 lbs 3x10 (increase next visit), RLE only 100# 3x10 Fwd step up Rt leg WB 4 inch 2 x 10 Knee extension double leg up Rt leg lowering slowly 1-2 seconds 10 lbs 2 x 10 (reduced amount due to arrival presentation RLE only knee flexion 20# 3x10  Neuro re-ed Tandem stance on foam 1 min x 1 bilateral c occasional hand assist 4 inch step over clear with flexion focus and forward and reverse weight shift on stance leg x 10 bilateral   Modalities Vaso Rt knee x 10 min, medium pressure; 34 deg in elevation    PATIENT EDUCATION:  Education details: HEP Person educated: Patient Education method: Consulting civil engineer, Media planner, and Handouts Education comprehension: verbalized understanding, returned demonstration, and needs further education   HOME EXERCISE PROGRAM: Access Code: [email or web address] URL: [email or web address] Date: 10/29/2022 Prepared by: Faustino Congress   Exercises - Quad Set  - 5-10 x daily - 7 x weekly - 1 sets - 5-10 reps - 5 sec hold - Supine Heel Slide with Strap  - 5-10 x daily - 7 x weekly - 1 sets - 5-10 reps - 5 sec hold - Seated Knee Flexion AAROM  - 3-5 x daily - 7 x weekly - 1 sets - 10 reps - 10 sec hold - Seated Knee Extension AROM  - 3-5 x daily - 7 x weekly - 1 sets - 10 reps - 5 sec hold - Seated Straight Leg Raise  - 3-5 x daily - 7 x weekly - 1 sets - 10 reps - 5 sec hold   ASSESSMENT:   CLINICAL IMPRESSION: Pt has  attended 8 visits overall during course of treatment.  Global rating of change rated +4 at this time.  See objective data for updated information.  Gains noted in all areas towards goals.  Independent ambulation noted in clinic c deviations of stance phase and step lengths.  Continued skilled PT services indicated to progress towards goals.      OBJECTIVE IMPAIRMENTS: Abnormal gait, decreased activity tolerance, decreased balance, decreased knowledge of use of DME, decreased mobility, difficulty walking, decreased ROM, decreased strength, increased edema, increased fascial restrictions, increased muscle spasms, impaired flexibility, and pain.    ACTIVITY LIMITATIONS: carrying, lifting, standing, sleeping, stairs, transfers, and locomotion level   PARTICIPATION LIMITATIONS: cleaning, driving, community activity, occupation, and yard work   PERSONAL FACTORS: 3+ comorbidities: Morbid obesity, OA, HTN, OSA  are also affecting patient's functional outcome.    REHAB POTENTIAL: Good   CLINICAL DECISION MAKING: Evolving/moderate complexity   EVALUATION COMPLEXITY: Moderate     GOALS: Goals reviewed with patient? Yes   SHORT TERM GOALS: Target date: 11/26/2022   Independent with initial HEP Goal status: Met 11/21/2022   2.  Rt knee AROM 0-100 deg for improved function and mobility Goal status: on going - 11/21/2022   LONG TERM GOALS: Target date: 12/24/2022   Independent with final HEP Goal status: on going - 11/21/2022   2.  FOTO score improved to 75 Goal status: on going - 11/21/2022   3.  Rt knee AROM improved to 0-110 for improved function Goal status: on going - 11/21/2022   4.  Report pain < 3/10 with standing and walking activities for improved function Goal status: on going - 11/21/2022   5.  Amb without AD without significant deviations for improved mobility Goal status: on going - 11/21/2022       PLAN:   PT FREQUENCY: 2x/week   PT DURATION: 8 weeks   PLANNED  INTERVENTIONS: Therapeutic exercises, Therapeutic activity, Neuromuscular re-education, Balance training, Gait training, Patient/Family education, Self Care, Joint mobilization, Stair training, DME instructions, Aquatic Therapy, Dry Needling, Electrical stimulation, Cryotherapy, Moist heat, Taping, Vasopneumatic device, Manual therapy, and Re-evaluation   PLAN FOR NEXT SESSION:  Advised continued skilled PT services at this time to address impairments and reach goals.  Strengthening/balance continued.   Scot Jun, PT, DPT, OCS, ATC 11/21/22  10:07 AM

## 2022-11-26 ENCOUNTER — Ambulatory Visit (INDEPENDENT_AMBULATORY_CARE_PROVIDER_SITE_OTHER): Payer: 59 | Admitting: Physical Therapy

## 2022-11-26 ENCOUNTER — Encounter: Payer: Self-pay | Admitting: Physical Therapy

## 2022-11-26 DIAGNOSIS — R2681 Unsteadiness on feet: Secondary | ICD-10-CM

## 2022-11-26 DIAGNOSIS — R600 Localized edema: Secondary | ICD-10-CM | POA: Diagnosis not present

## 2022-11-26 DIAGNOSIS — M25561 Pain in right knee: Secondary | ICD-10-CM | POA: Diagnosis not present

## 2022-11-26 DIAGNOSIS — R2689 Other abnormalities of gait and mobility: Secondary | ICD-10-CM

## 2022-11-26 DIAGNOSIS — M25661 Stiffness of right knee, not elsewhere classified: Secondary | ICD-10-CM | POA: Diagnosis not present

## 2022-11-26 NOTE — Therapy (Signed)
OUTPATIENT PHYSICAL THERAPY TREATMENT NOTE   Patient Name: Charles Morales MRN: [id number] DOB:08-Dec-1961, 61 y.o., male Today's Date: 11/26/2022  PCP: Loura Pardon, MD  REFERRING PROVIDER: Mcarthur Rossetti, MD      END OF SESSION:   PT End of Session - 11/26/22 1023     Visit Number 9    Number of Visits 16    Date for PT Re-Evaluation 12/24/22    Authorization Type [employer] $25 copay, 60 visit limit    Progress Note Due on Visit 10    PT Start Time 1010    PT Stop Time 1048    PT Time Calculation (min) 38 min    Activity Tolerance Patient tolerated treatment well    Behavior During Therapy WFL for tasks assessed/performed                    Past Medical History:  Diagnosis Date   Allergy    Arthritis    right knee   Esophageal reflux    Family history of breast cancer    Family history of genetic disease carrier    sister NBN pathogenic variant   Family history of pancreatic cancer    Fatty liver    HLD (hyperlipidemia)    HTN (hypertension)    Morbid obesity ([place])    Pre-diabetes    Sleep apnea    USES CPAP    Tobacco use disorder    Unspecified sleep apnea    Past Surgical History:  Procedure Laterality Date   COLONOSCOPY  2013   COLONOSCOPY WITH PROPOFOL N/A 05/20/2018   Procedure: COLONOSCOPY WITH PROPOFOL;  Surgeon: Jerene Bears, MD;  Location: [facility];  Service: Gastroenterology;  Laterality: N/A;   KNEE ARTHROSCOPY     left   TOTAL KNEE ARTHROPLASTY Right 10/12/2022   Procedure: RIGHT TOTAL KNEE ARTHROPLASTY;  Surgeon: Mcarthur Rossetti, MD;  Location: [facility];  Service: Orthopedics;  Laterality: Right;   Patient Active Problem List   Diagnosis Date Noted   Status post total right knee replacement 10/12/2022   Current use of proton pump inhibitor 11/20/2021   Genetic testing 09/15/2018   Family history of genetic disease carrier 09/02/2018   Family history of breast cancer    Family history of pancreatic cancer     Prostate cancer screening 08/17/2018   History of colonic polyps    Prediabetes 04/01/2016   Left ankle pain 10/14/2015   PVC's (premature ventricular contractions) 10/22/2013   Colon cancer screening 05/23/2012   Routine general medical examination at a health care facility 05/20/2012   Knee pain, right 06/08/2011   G E R D 08/04/2007   Hyperlipidemia 07/22/2007   Morbid obesity ([place]) 07/22/2007   SMOKELESS TOBACCO ABUSE 07/22/2007   Essential hypertension 07/22/2007   Sleep apnea 07/22/2007    REFERRING DIAG: Z96.651 (ICD-10-CM) - Status post total right knee replacement    THERAPY DIAG:  Acute pain of right knee  Localized edema  Unsteadiness on feet  Stiffness of right knee, not elsewhere classified  Other abnormalities of gait and mobility  Rationale for Evaluation and Treatment Rehabilitation  PERTINENT HISTORY: Morbid obesity, OA, HTN, OSA  PRECAUTIONS: None  SUBJECTIVE:    SUBJECTIVE STATEMENT: Doing well overall; feels each day is getting better.  RLE still feels weaker.  PAIN:  NPRS scale: 2/10 Pain location: Rt knee Pain description: tightness, aching, sore Aggravating factors: bending knee, prolonged positioning Relieving factors: ice, walking    WEIGHT BEARING RESTRICTIONS:  No   FALLS:  Has patient fallen in last 6 months? 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)   PLOF: Independent and Leisure: work, walking on treadmill occasionally, play golf   PATIENT GOALS: improve pain, regain use of RLE, return to work, play golf     OBJECTIVE:    PATIENT SURVEYS:  11/21/2022 : FOTO update:  67  10/29/22: FOTO 63 (predicted 75)   EDEMA:  10/29/22  Circumferential: Knee Joint Line: Rt: 47.7 cm  /Lt: 44.5 cm   LOWER EXTREMITY ROM:   Active ROM Right 10/29/2022 Right 11/05/2022 Right 11/08/22 Right 11/12/22 Right  11/21/2022  Knee flexion 79 98 in supine heel slide AROM  100 AA: 102 A: 100 AA: 104 AROM in supine heel slide 106  Knee extension -3 (seated LAQ)  -3 in seated LAQ AROM -2 (seated LAQ)  -2 in seated LAQ   (Blank rows = not tested)     Passive ROM Right 10/29/2022  Knee flexion 90  Knee extension -1   (Blank rows = not tested)       LOWER EXTREMITY MMT:   MMT Right 10/29/2022 Right 11/21/2022 Left 11/21/2022  Knee flexion 3-/5 5/5   Knee extension 3-/5 5/5 49, 52 lbs 5/5 45.8,49.1 lbs   (Blank rows = not tested)   FUNCTIONAL TESTS:  10/29/2022 Deferred today   GAIT: 11/21/2022:  independent c mild lacking TKE in stance, variable step length.   10/29/2022 Distance walked: 100' Assistive device utilized: Lobbyist Level of assistance: Modified independence Comments: step to pattern, decr stance on Rt; decr hip/knee flexion     TODAY'S TREATMENT:   11/26/22 Therex: Recumbent bike seat 7; L3 x 10 mins Leg Press 150# 3x10; then RLE only 100# 3x10 RLE only: knee extension 15# 3x10; knee flexion 25# 3x10 Squats with 15# KB 3x10 Deadlifts 3x10 with 15# KB SLS on Rt 5x30 sec with slight Lt abduction   11/21/2022 Therex: Recumbent bike seat 9; full rev x 11 mins (additional time due to tightness upon arrival today) Leg Press 125# 2 x10; then RLE only 75# 2 x 15 Incline gastroc stretch 30 sec x 5 bilateral Seated LAQ c pauses in end range each way x 15 Supine AROM heel slide x 5 Knee extension double leg up Rt leg lowering slowly 1-2 seconds 15 lbs 2 x 10 (reduced amount due to arrival presentation  Neuro re-ed Tandem stance on foam 1 min x  2 bilateral  Modalities Vaso Rt knee x 10 min, medium pressure; 34 deg in elevation  11/19/22 Therex: Recumbent bike seat 8; full rev x  10 mins Leg Press 125# 3x10; then RLE only 75# 3x10 Lateral heel taps with RLE on 4" step 3x10 RDL 15# KB 3x10  Neuro re-ed Tandem stance on foam 3x30 sec bilateral c occasional hand assist SLS on foam 3x15 sec bil; UE support needed SLS on RLE with Lt taps to Blaze Pods (3 pods, 30 sec on, 30 sec rest x 5 cycles)  11/15/2022: Therex: Recumbent bike seat 8 partial rev to full rev x 10 mins (longer time today due to tightness upon arrival) Leg press Double leg 100 lbs 3x10 (increase next visit), RLE only 100#  3x10 Fwd step up Rt leg WB 4 inch 2 x 10 Knee extension double leg up Rt leg lowering slowly 1-2 seconds 10 lbs 2 x 10 (reduced amount due to arrival presentation RLE only knee flexion 20# 3x10  Neuro re-ed Tandem stance on foam 1 min x 1 bilateral c occasional hand assist 4 inch step over clear with flexion focus and forward and reverse weight shift on stance leg x 10 bilateral   Modalities Vaso Rt knee x 10 min, medium pressure; 34 deg in elevation    PATIENT EDUCATION:  Education details: HEP Person educated: Patient Education method: Consulting civil engineer, Media planner, and Handouts Education comprehension: verbalized understanding, returned demonstration, and needs further education   HOME EXERCISE PROGRAM: Access Code: [email or web address] URL: [email or web address] Date: 11/26/2022 Prepared by: Faustino Congress  Exercises - Quad Set  - 5-10 x daily - 7 x weekly - 1 sets - 5-10 reps - 5 sec hold - Supine Heel Slide with Strap  - 5-10 x daily - 7 x weekly - 1 sets - 5-10 reps - 5 sec hold - Seated Knee Flexion AAROM  - 3-5 x daily - 7 x weekly - 1 sets - 10 reps - 10 sec hold - Seated Knee Extension AROM  - 3-5 x daily - 7 x weekly - 1 sets - 10 reps - 5 sec hold - Seated Straight Leg Raise  - 3-5 x daily - 7 x weekly - 1 sets - 10 reps - 5 sec hold - Full Leg Press  - 1 x daily - 7 x weekly - 3 sets - 10 reps - Single Leg Press  - 1 x daily - 7 x weekly - 3 sets -  10 reps - Single Leg Hamstring Curl with Weight Machine  - 1 x daily - 7 x weekly - 3 sets - 10 reps - Single Leg Knee Extension with Weight Machine  - 1 x daily - 7 x weekly - 3 sets - 10 reps - Kettlebell Squat  - 1 x daily - 7 x weekly - 3 sets - 10 reps - Kettlebell Deadlift  - 1 x daily - 7 x weekly - 3 sets - 10 reps - Single Leg Stance  - 1 x daily - 7 x weekly - 1 sets - 5 reps - 30 sec hold   ASSESSMENT:   CLINICAL IMPRESSION: Overall pt progressing well with PT, and anticipate we are nearing d/c.  Updated HEP to include gym and home strengthening exercises.  Will continue to benefit from PT to maximize function.     OBJECTIVE IMPAIRMENTS: Abnormal gait, decreased activity tolerance, decreased balance, decreased knowledge of use of DME, decreased mobility, difficulty walking, decreased ROM, decreased strength, increased edema, increased fascial restrictions, increased muscle spasms, impaired flexibility, and pain.    ACTIVITY LIMITATIONS: carrying, lifting, standing, sleeping, stairs, transfers, and locomotion level   PARTICIPATION LIMITATIONS: cleaning, driving, community activity, occupation, and yard work   PERSONAL FACTORS: 3+ comorbidities: Morbid obesity, OA, HTN, OSA  are also affecting patient's functional outcome.    REHAB POTENTIAL: Good   CLINICAL DECISION MAKING: Evolving/moderate complexity   EVALUATION COMPLEXITY: Moderate     GOALS: Goals reviewed with patient? Yes   SHORT TERM GOALS: Target date: 11/26/2022   Independent with initial HEP Goal status: Met 11/21/2022   2.  Rt knee AROM 0-100 deg for improved function and mobility Goal status: on going - 11/21/2022   LONG TERM GOALS: Target date: 12/24/2022   Independent with  final HEP Goal status: on going - 11/21/2022   2.  FOTO score improved to 75 Goal status: on going - 11/21/2022   3.  Rt knee AROM improved to 0-110 for improved function Goal status: on going - 11/21/2022   4.  Report pain  < 3/10 with standing and walking activities for improved function Goal status: on going - 11/21/2022   5.  Amb without AD without significant deviations for improved mobility Goal status: on going - 11/21/2022       PLAN:   PT FREQUENCY: 2x/week   PT DURATION: 8 weeks   PLANNED INTERVENTIONS: Therapeutic exercises, Therapeutic activity, Neuromuscular re-education, Balance training, Gait training, Patient/Family education, Self Care, Joint mobilization, Stair training, DME instructions, Aquatic Therapy, Dry Needling, Electrical stimulation, Cryotherapy, Moist heat, Taping, Vasopneumatic device, Manual therapy, and Re-evaluation   PLAN FOR NEXT SESSION:  check ROM,  Strengthening/balance continued.    Laureen Abrahams, PT, DPT 11/26/22 11:04 AM

## 2022-11-27 ENCOUNTER — Other Ambulatory Visit: Payer: Self-pay | Admitting: Family Medicine

## 2022-11-28 NOTE — Telephone Encounter (Signed)
Pt is due for his CPE (labs prior), please schedule appt and then route back to me to refill, thanks

## 2022-11-29 ENCOUNTER — Encounter: Payer: Self-pay | Admitting: Rehabilitative and Restorative Service Providers"

## 2022-11-29 ENCOUNTER — Telehealth: Payer: Self-pay

## 2022-11-29 ENCOUNTER — Ambulatory Visit (INDEPENDENT_AMBULATORY_CARE_PROVIDER_SITE_OTHER): Payer: 59 | Admitting: Rehabilitative and Restorative Service Providers"

## 2022-11-29 DIAGNOSIS — R2681 Unsteadiness on feet: Secondary | ICD-10-CM | POA: Diagnosis not present

## 2022-11-29 DIAGNOSIS — M25561 Pain in right knee: Secondary | ICD-10-CM

## 2022-11-29 DIAGNOSIS — R600 Localized edema: Secondary | ICD-10-CM | POA: Diagnosis not present

## 2022-11-29 DIAGNOSIS — R2689 Other abnormalities of gait and mobility: Secondary | ICD-10-CM

## 2022-11-29 DIAGNOSIS — M25661 Stiffness of right knee, not elsewhere classified: Secondary | ICD-10-CM

## 2022-11-29 NOTE — Therapy (Signed)
OUTPATIENT PHYSICAL THERAPY TREATMENT NOTE   Patient Name: Charles Morales MRN: [id number] DOB:Aug 28, 1961, 61 y.o., male Today's Date: 11/29/2022  PCP: Loura Pardon, MD  REFERRING PROVIDER: Mcarthur Rossetti, MD      END OF SESSION:   PT End of Session - 11/29/22 1158     Visit Number 10    Number of Visits 16    Date for PT Re-Evaluation 12/24/22    Authorization Type [employer] $25 copay, 60 visit limit    Progress Note Due on Visit 16    PT Start Time 1130    PT Stop Time 1210    PT Time Calculation (min) 40 min    Activity Tolerance Patient tolerated treatment well    Behavior During Therapy WFL for tasks assessed/performed                     Past Medical History:  Diagnosis Date   Allergy    Arthritis    right knee   Esophageal reflux    Family history of breast cancer    Family history of genetic disease carrier    sister NBN pathogenic variant   Family history of pancreatic cancer    Fatty liver    HLD (hyperlipidemia)    HTN (hypertension)    Morbid obesity ([place])    Pre-diabetes    Sleep apnea    USES CPAP    Tobacco use disorder    Unspecified sleep apnea    Past Surgical History:  Procedure Laterality Date   COLONOSCOPY  2013   COLONOSCOPY WITH PROPOFOL N/A 05/20/2018   Procedure: COLONOSCOPY WITH PROPOFOL;  Surgeon: Jerene Bears, MD;  Location: [facility];  Service: Gastroenterology;  Laterality: N/A;   KNEE ARTHROSCOPY     left   TOTAL KNEE ARTHROPLASTY Right 10/12/2022   Procedure: RIGHT TOTAL KNEE ARTHROPLASTY;  Surgeon: Mcarthur Rossetti, MD;  Location: [facility];  Service: Orthopedics;  Laterality: Right;   Patient Active Problem List   Diagnosis Date Noted   Status post total right knee replacement 10/12/2022   Current use of proton pump inhibitor 11/20/2021   Genetic testing 09/15/2018   Family history of genetic disease carrier 09/02/2018   Family history of breast cancer    Family history of pancreatic cancer     Prostate cancer screening 08/17/2018   History of colonic polyps    Prediabetes 04/01/2016   Left ankle pain 10/14/2015   PVC's (premature ventricular contractions) 10/22/2013   Colon cancer screening 05/23/2012   Routine general medical examination at a health care facility 05/20/2012   Knee pain, right 06/08/2011   G E R D 08/04/2007   Hyperlipidemia 07/22/2007   Morbid obesity ([place]) 07/22/2007   SMOKELESS TOBACCO ABUSE 07/22/2007   Essential hypertension 07/22/2007   Sleep apnea 07/22/2007    REFERRING DIAG: Z96.651 (ICD-10-CM) - Status post total right knee replacement    THERAPY DIAG:  Acute pain of right knee  Localized edema  Unsteadiness on feet  Stiffness of right knee, not elsewhere classified  Other abnormalities of gait and mobility  Rationale for Evaluation and Treatment Rehabilitation  PERTINENT HISTORY: Morbid obesity, OA, HTN, OSA  PRECAUTIONS: None  SUBJECTIVE:    SUBJECTIVE STATEMENT: Doing well overall; feels each day is getting better.  RLE still feels weaker.  PAIN:  NPRS scale: 2/10 Pain location: Rt knee Pain description: tightness, aching, sore Aggravating factors: bending knee, prolonged positioning Relieving factors: ice, walking    WEIGHT BEARING  RESTRICTIONS: No   FALLS:  Has patient fallen in last 6 months? 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)   PLOF: Independent and Leisure: work, walking on treadmill occasionally, play golf   PATIENT GOALS: improve pain, regain use of RLE, return to work, play golf     OBJECTIVE:    PATIENT SURVEYS:  11/21/2022 : FOTO update:  67  10/29/22: FOTO 63 (predicted 75)   EDEMA:  10/29/22  Circumferential: Knee Joint Line: Rt: 47.7 cm  /Lt: 44.5 cm   LOWER EXTREMITY ROM:   Active ROM Right 10/29/2022 Right 11/05/2022 Right 11/08/22 Right 11/12/22 Right  11/21/2022 Right 11/29/2022  Knee flexion 79 98 in supine heel slide AROM  100 AA: 102 A: 100 AA: 104 AROM in supine heel slide 106 106 AROM in supine heel slide  Knee extension -3 (seated LAQ)  -3 in seated LAQ AROM -2 (seated LAQ)  -2 in seated LAQ    (Blank rows = not tested)     Passive ROM Right 10/29/2022  Knee flexion 90  Knee extension -1   (Blank rows = not tested)       LOWER EXTREMITY MMT:   MMT Right 10/29/2022 Right 11/21/2022 Left 11/21/2022  Knee flexion 3-/5 5/5   Knee extension 3-/5 5/5 49, 52 lbs 5/5 45.8,49.1 lbs   (Blank rows = not tested)   FUNCTIONAL TESTS:  10/29/2022 Deferred today   GAIT: 11/21/2022:  independent c mild lacking TKE in stance, variable step length.   10/29/2022 Distance walked: 100' Assistive device utilized: Lobbyist Level of assistance: Modified independence Comments: step to pattern, decr stance on Rt; decr hip/knee flexion     TODAY'S TREATMENT:   11/29/2022 Therex: Recumbent bike seat 9; L3 x 10 mins knee extension machine double leg up, Rt leg lowering 2 x 10 20 lbs  Squat 15 lb kettle bell x 15  TherActivity Step on over and down 6 inch WB on Rt leg with single hand rail assist x 15 Flight of stairs reciprocal gait up and down single hand rail assist for household entry    Neuro re-ed SLS on black mat c contralateral leg corner touching x 8 bilateral  11/26/22 Therex: Recumbent bike seat 7; L3 x 10 mins Leg Press 150# 3x10; then RLE only 100# 3x10 RLE only: knee extension 15# 3x10; knee flexion 25# 3x10 Squats with 15# KB 3x10 Deadlifts 3x10 with 15# KB SLS on Rt 5x30 sec with slight Lt abduction   11/21/2022 Therex: Recumbent bike seat 9; full rev x 11 mins (additional time due to tightness upon arrival today) Leg  Press 125# 2 x10; then RLE only 75# 2 x 15 Incline gastroc stretch 30 sec x 5 bilateral Seated LAQ c pauses in end range each way x 15 Supine AROM heel slide x 5 Knee extension double leg up Rt leg lowering slowly 1-2 seconds 15 lbs 2 x 10 (reduced amount due to arrival presentation  Neuro re-ed Tandem stance on foam 1 min x  2 bilateral  Modalities Vaso Rt knee x 10 min, medium pressure; 34 deg in elevation    PATIENT EDUCATION:  Education details: HEP Person educated: Patient Education method: Explanation, Demonstration, and Handouts  Education comprehension: verbalized understanding, returned demonstration, and needs further education   HOME EXERCISE PROGRAM: Access Code: [email or web address] URL: [email or web address] Date: 11/26/2022 Prepared by: Faustino Congress  Exercises - Quad Set  - 5-10 x daily - 7 x weekly - 1 sets - 5-10 reps - 5 sec hold - Supine Heel Slide with Strap  - 5-10 x daily - 7 x weekly - 1 sets - 5-10 reps - 5 sec hold - Seated Knee Flexion AAROM  - 3-5 x daily - 7 x weekly - 1 sets - 10 reps - 10 sec hold - Seated Knee Extension AROM  - 3-5 x daily - 7 x weekly - 1 sets - 10 reps - 5 sec hold - Seated Straight Leg Raise  - 3-5 x daily - 7 x weekly - 1 sets - 10 reps - 5 sec hold - Full Leg Press  - 1 x daily - 7 x weekly - 3 sets - 10 reps - Single Leg Press  - 1 x daily - 7 x weekly - 3 sets - 10 reps - Single Leg Hamstring Curl with Weight Machine  - 1 x daily - 7 x weekly - 3 sets - 10 reps - Single Leg Knee Extension with Weight Machine  - 1 x daily - 7 x weekly - 3 sets - 10 reps - Kettlebell Squat  - 1 x daily - 7 x weekly - 3 sets - 10 reps - Kettlebell Deadlift  - 1 x daily - 7 x weekly - 3 sets - 10 reps - Single Leg Stance  - 1 x daily - 7 x weekly - 1 sets - 5 reps - 30 sec hold   ASSESSMENT:   CLINICAL IMPRESSION: Able to demonstrate good control on stair navigation.      OBJECTIVE IMPAIRMENTS: Abnormal gait, decreased activity  tolerance, decreased balance, decreased knowledge of use of DME, decreased mobility, difficulty walking, decreased ROM, decreased strength, increased edema, increased fascial restrictions, increased muscle spasms, impaired flexibility, and pain.    ACTIVITY LIMITATIONS: carrying, lifting, standing, sleeping, stairs, transfers, and locomotion level   PARTICIPATION LIMITATIONS: cleaning, driving, community activity, occupation, and yard work   PERSONAL FACTORS: 3+ comorbidities: Morbid obesity, OA, HTN, OSA  are also affecting patient's functional outcome.    REHAB POTENTIAL: Good   CLINICAL DECISION MAKING: Evolving/moderate complexity   EVALUATION COMPLEXITY: Moderate     GOALS: Goals reviewed with patient? Yes   SHORT TERM GOALS: Target date: 11/26/2022   Independent with initial HEP Goal status: Met 11/21/2022   2.  Rt knee AROM 0-100 deg for improved function and mobility Goal status: on going - 11/21/2022   LONG TERM GOALS: Target date: 12/24/2022   Independent with final HEP Goal status: on going - 11/21/2022   2.  FOTO score improved to 75 Goal status: on going - 11/21/2022   3.  Rt knee AROM improved to 0-110 for improved function Goal status: on going - 11/21/2022   4.  Report pain < 3/10 with standing and walking activities for improved function Goal status: on going - 11/21/2022   5.  Amb without AD without significant deviations for improved mobility Goal status: MET 11/29/2022       PLAN:   PT FREQUENCY: 2x/week   PT DURATION: 8 weeks   PLANNED INTERVENTIONS: Therapeutic exercises, Therapeutic activity, Neuromuscular re-education, Balance training, Gait training, Patient/Family education, Self Care, Joint mobilization, Stair training, DME instructions, Aquatic Therapy, Dry Needling, Electrical stimulation, Cryotherapy, Moist heat,  Taping, Vasopneumatic device, Manual therapy, and Re-evaluation   PLAN FOR NEXT SESSION:  Continue dynamic stability  improvements and prep for discharge when ready.    Scot Jun, PT, DPT, OCS, ATC 11/29/22  12:15 PM

## 2022-11-29 NOTE — Telephone Encounter (Signed)
Lvmtcb, sent mychart message  

## 2022-11-29 NOTE — Telephone Encounter (Signed)
Lm for patient to request that he bring SD card to 11/30/2022 visit, however I was able to obtain a download and SD card is not needed.

## 2022-11-30 ENCOUNTER — Encounter: Payer: Self-pay | Admitting: Internal Medicine

## 2022-11-30 ENCOUNTER — Ambulatory Visit (INDEPENDENT_AMBULATORY_CARE_PROVIDER_SITE_OTHER): Payer: 59 | Admitting: Internal Medicine

## 2022-11-30 VITALS — BP 138/88 | HR 78 | Temp 98.0°F | Ht 70.0 in | Wt 328.8 lb

## 2022-11-30 DIAGNOSIS — G4719 Other hypersomnia: Secondary | ICD-10-CM | POA: Diagnosis not present

## 2022-11-30 NOTE — Patient Instructions (Addendum)
Continue CPAP as prescribed  EXCELLENT JOB A+   Recommend weight loss

## 2022-11-30 NOTE — Progress Notes (Signed)
Name: Charles Morales MRN: [id number] DOB: 11-Sep-1961      CHIEF COMPLAINT:  Excessive daytime sleepiness   HISTORY OF PRESENT ILLNESS:  Patient is seen today for problems and issues with sleep related to excessive daytime sleepiness Patient  has been having sleep problems for many years Diagnosis of +OSA 2018   Discussed sleep data and reviewed with patient.  Encouraged proper weight management.  Discussed driving precautions and its relationship with hypersomnolence.  Discussed operating dangerous equipment and its relationship with hypersomnolence.  Discussed sleep hygiene, and benefits of a fixed sleep waked time.  The importance of getting eight or more hours of sleep discussed with patient.  Discussed limiting the use of the computer and television before bedtime.  Decrease naps during the day, so night time sleep will become enhanced.  Limit caffeine, and sleep deprivation.  HTN, stroke, and heart failure are potential risk factors.    EPWORTH SLEEP SCORE 3  CPAP DL 10/2022 97% compliance for days 93 compliance for greater than 4 hours  AHI reduced to 3.5(initial AHI 13)      PAST MEDICAL HISTORY :   has a past medical history of Allergy, Arthritis, Esophageal reflux, Family history of breast cancer, Family history of genetic disease carrier, Family history of pancreatic cancer, Fatty liver, HLD (hyperlipidemia), HTN (hypertension), Morbid obesity ([place]), Pre-diabetes, Sleep apnea, Tobacco use disorder, and Unspecified sleep apnea.  has a past surgical history that includes Knee arthroscopy; Colonoscopy (2013); Colonoscopy with propofol (N/A, 05/20/2018); and Total knee arthroplasty (Right, 10/12/2022). Prior to Admission medications   Medication Sig Start Date End Date Taking? Authorizing Provider  ALPRAZolam Duanne Moron) 0.5 MG tablet Take 1 tablet (0.5 mg total) by mouth daily as needed for anxiety. 09/15/20   Tower, Wynelle Fanny, MD  amoxicillin (AMOXIL) 500 MG tablet  Take 2 by mouth one hour before dental appt, then 2 by mouth six hours after. 10/30/22   Mcarthur Rossetti, MD  aspirin EC 325 MG tablet Take 1 tablet (325 mg total) by mouth 2 (two) times daily. Patient not taking: Reported on 10/29/2022 10/13/22   Mcarthur Rossetti, MD  cetirizine (ZYRTEC) 10 MG tablet Take 10 mg by mouth daily.    [provider]  cyanocobalamin (VITAMIN B12) 1000 MCG tablet Take 1,000 mcg by mouth daily.    [provider]  diclofenac Sodium (VOLTAREN) 1 % GEL Apply 1 Application topically 4 (four) times daily as needed (pain).    [provider]  diphenhydrAMINE HCl, Sleep, (ZZZQUIL) 50 MG/30ML LIQD Take 50 mg by mouth at bedtime as needed (sleep).    [provider]  GLUCOSAMINE-CHONDROITIN PO Take 1 tablet by mouth 2 (two) times daily.    [provider]  HYDROmorphone (DILAUDID) 4 MG tablet Take 1 tablet (4 mg total) by mouth every 4 (four) hours as needed for severe pain. 11/02/22   Mcarthur Rossetti, MD  lisinopril-hydrochlorothiazide (ZESTORETIC) 20-25 MG tablet TAKE 1 TABLET BY MOUTH EVERY DAY 11/30/22   Tower, Wynelle Fanny, MD  methocarbamol (ROBAXIN) 500 MG tablet Take 1 tablet (500 mg total) by mouth every 6 (six) hours as needed for muscle spasms. 10/13/22   Mcarthur Rossetti, MD  NON FORMULARY CPAP at night as directed     [provider]  Omega-3 Fatty Acids (FISH OIL) 1200 MG CPDR Take 2,400 mg by mouth 2 (two) times daily.    [provider]  omeprazole (PRILOSEC) 40 MG capsule TAKE 1 CAPSULE (40 MG TOTAL)  BY MOUTH DAILY. 11/30/22   Tower, Wynelle Fanny, MD  Oxymetazoline HCl (VICKS SINEX NA) Place 1 spray into the nose daily as needed (allergies).    [provider]  rosuvastatin (CRESTOR) 5 MG tablet TAKE 1 TABLET (5 MG TOTAL) BY MOUTH DAILY. 11/30/22   Tower, Wynelle Fanny, MD   No Known Allergies  FAMILY HISTORY:  family history includes Breast cancer (age of onset: 19) in his  sister; Heart attack in his mother; Heart attack (age of onset: 29) in his father; Heart disease in his father and mother; Hypertension in his mother; Kidney disease in his maternal grandfather and mother; Lung cancer in his brother. SOCIAL HISTORY:  reports that he quit smoking about 30 years ago. His smoking use included cigarettes. His smokeless tobacco use includes snuff. He reports current alcohol use. He reports that he does not use drugs.   Review of Systems:  Gen:  Denies  fever, sweats, chills weight loss  HEENT: Denies blurred vision, double vision, ear pain, eye pain, hearing loss, nose bleeds, sore throat Cardiac:  No dizziness, chest pain or heaviness, chest tightness,edema, No JVD Resp:   No cough, -sputum production, -shortness of breath,-wheezing, -hemoptysis,  Gi: Denies swallowing difficulty, stomach pain, nausea or vomiting, diarrhea, constipation, bowel incontinence Gu:  Denies bladder incontinence, burning urine Ext:   Denies Joint pain, stiffness or swelling Skin: Denies  skin rash, easy bruising or bleeding or hives Endoc:  Denies polyuria, polydipsia , polyphagia or weight change Psych:   Denies depression, insomnia or hallucinations  Other:  All other systems negative   ALL OTHER ROS ARE NEGATIVE   BP 138/88 (BP Location: Left Arm, Cuff Size: Large)   Pulse 78   Temp 98 F (36.7 C) (Temporal)   Ht '5\' 10"'$  (1.778 m)   Wt (!) 328 lb 12.8 oz (149.1 kg)   SpO2 98%   BMI 47.18 kg/m     Physical Examination:   General Appearance: No distress  EYES PERRLA, EOM intact.   NECK Supple, No JVD Pulmonary: normal breath sounds, No wheezing.  CardiovascularNormal S1,S2.  No m/r/g.   Abdomen: Benign, Soft, non-tender. Skin:   warm, no rashes, no ecchymosis  Extremities: normal, no cyanosis, clubbing. Neuro:without focal findings,  speech normal  PSYCHIATRIC: Mood, affect within normal limits.   ALL OTHER ROS ARE NEGATIVE       ASSESSMENT AND  PLAN SYNOPSIS  61 year old white male seen today for follow-up assessment for OSA and reestablish office visit  Underlying sleep apnea Well-controlled Excellent compliance report reviewed with patient Patient benefits from CPAP therapy Continue as prescribed Auto CPAP 12 to 18 cm water pressure  Obesity -recommend significant weight loss -recommend changing diet  Deconditioned state -Recommend increased daily activity and exercise   MEDICATION ADJUSTMENTS/LABS AND TESTS ORDERED:    CURRENT MEDICATIONS REVIEWED AT LENGTH WITH PATIENT TODAY   Patient  satisfied with Plan of action and management. All questions answered   Follow up 1 year   Total Time Spent  22 mins   Jah Alarid Patricia Pesa, M.D.  [facility]  Medical Director [place] Director [facility] Cardio-Pulmonary Department

## 2022-12-04 ENCOUNTER — Encounter: Payer: Self-pay | Admitting: Rehabilitative and Restorative Service Providers"

## 2022-12-04 ENCOUNTER — Ambulatory Visit (INDEPENDENT_AMBULATORY_CARE_PROVIDER_SITE_OTHER): Payer: 59 | Admitting: Rehabilitative and Restorative Service Providers"

## 2022-12-04 DIAGNOSIS — R600 Localized edema: Secondary | ICD-10-CM | POA: Diagnosis not present

## 2022-12-04 DIAGNOSIS — R2681 Unsteadiness on feet: Secondary | ICD-10-CM

## 2022-12-04 DIAGNOSIS — M25561 Pain in right knee: Secondary | ICD-10-CM

## 2022-12-04 DIAGNOSIS — R2689 Other abnormalities of gait and mobility: Secondary | ICD-10-CM

## 2022-12-04 DIAGNOSIS — M25661 Stiffness of right knee, not elsewhere classified: Secondary | ICD-10-CM | POA: Diagnosis not present

## 2022-12-04 NOTE — Therapy (Signed)
OUTPATIENT PHYSICAL THERAPY TREATMENT NOTE   Patient Name: Charles Morales MRN: [id number] DOB:13-Jul-1961, 61 y.o., male Today's Date: 12/04/2022  PCP: Loura Pardon, MD  REFERRING PROVIDER: Mcarthur Rossetti, MD      END OF SESSION:   PT End of Session - 12/04/22 1132     Visit Number 11    Number of Visits 16    Date for PT Re-Evaluation 12/24/22    Authorization Type [employer] $25 copay, 60 visit limit    Progress Note Due on Visit 53    PT Start Time 5188    PT Stop Time 1215    PT Time Calculation (min) 40 min    Activity Tolerance Patient tolerated treatment well    Behavior During Therapy WFL for tasks assessed/performed                      Past Medical History:  Diagnosis Date   Allergy    Arthritis    right knee   Esophageal reflux    Family history of breast cancer    Family history of genetic disease carrier    sister NBN pathogenic variant   Family history of pancreatic cancer    Fatty liver    HLD (hyperlipidemia)    HTN (hypertension)    Morbid obesity ([place])    Pre-diabetes    Sleep apnea    USES CPAP    Tobacco use disorder    Unspecified sleep apnea    Past Surgical History:  Procedure Laterality Date   COLONOSCOPY  2013   COLONOSCOPY WITH PROPOFOL N/A 05/20/2018   Procedure: COLONOSCOPY WITH PROPOFOL;  Surgeon: Jerene Bears, MD;  Location: [facility];  Service: Gastroenterology;  Laterality: N/A;   KNEE ARTHROSCOPY     left   TOTAL KNEE ARTHROPLASTY Right 10/12/2022   Procedure: RIGHT TOTAL KNEE ARTHROPLASTY;  Surgeon: Mcarthur Rossetti, MD;  Location: [facility];  Service: Orthopedics;  Laterality: Right;   Patient Active Problem List   Diagnosis Date Noted   Status post total right knee replacement 10/12/2022   Current use of proton pump inhibitor 11/20/2021   Genetic testing 09/15/2018   Family history of genetic disease carrier 09/02/2018   Family history of breast cancer    Family history of pancreatic cancer     Prostate cancer screening 08/17/2018   History of colonic polyps    Prediabetes 04/01/2016   Left ankle pain 10/14/2015   PVC's (premature ventricular contractions) 10/22/2013   Colon cancer screening 05/23/2012   Routine general medical examination at a health care facility 05/20/2012   Knee pain, right 06/08/2011   G E R D 08/04/2007   Hyperlipidemia 07/22/2007   Morbid obesity ([place]) 07/22/2007   SMOKELESS TOBACCO ABUSE 07/22/2007   Essential hypertension 07/22/2007   Sleep apnea 07/22/2007    REFERRING DIAG: Z96.651 (ICD-10-CM) - Status post total right knee replacement    THERAPY DIAG:  Acute pain of right knee  Localized edema  Unsteadiness on feet  Stiffness of right knee, not elsewhere classified  Other abnormalities of gait and mobility  Rationale for Evaluation and Treatment Rehabilitation  PERTINENT HISTORY: Morbid obesity, OA, HTN, OSA  PRECAUTIONS: None  SUBJECTIVE:    SUBJECTIVE STATEMENT: Pt indicated feeling pretty good.  He said he has to go to the dentist today.   PAIN:  NPRS scale: no pain upon arrival Pain location: Rt knee Pain description: tightness, aching, sore Aggravating factors: bending knee, prolonged positioning Relieving factors:  ice, walking    WEIGHT BEARING RESTRICTIONS: No   FALLS:  Has patient fallen in last 6 months? 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)   PLOF: Independent and Leisure: work, walking on treadmill occasionally, play golf   PATIENT GOALS: improve pain, regain use of RLE, return to work, play golf     OBJECTIVE:    PATIENT SURVEYS:  11/21/2022 : FOTO update:  67  10/29/22: FOTO 63 (predicted 75)   EDEMA:   10/29/22 Circumferential: Knee Joint Line: Rt: 47.7 cm  /Lt: 44.5 cm   LOWER EXTREMITY ROM:   Active ROM Right 10/29/2022 Right 11/05/2022 Right 11/08/22 Right 11/12/22 Right  11/21/2022 Right 11/29/2022  Knee flexion 79 98 in supine heel slide AROM  100 AA: 102 A: 100 AA: 104 AROM in supine heel slide 106 106 AROM in supine heel slide  Knee extension -3 (seated LAQ)  -3 in seated LAQ AROM -2 (seated LAQ)  -2 in seated LAQ    (Blank rows = not tested)     Passive ROM Right 10/29/2022  Knee flexion 90  Knee extension -1   (Blank rows = not tested)       LOWER EXTREMITY MMT:   MMT Right 10/29/2022 Right 11/21/2022 Left 11/21/2022  Knee flexion 3-/5 5/5   Knee extension 3-/5 5/5 49, 52 lbs 5/5 45.8,49.1 lbs   (Blank rows = not tested)   FUNCTIONAL TESTS:  10/29/2022 Deferred today   GAIT: 11/21/2022:  independent c mild lacking TKE in stance, variable step length.   10/29/2022 Distance walked: 100' Assistive device utilized: Lobbyist Level of assistance: Modified independence Comments: step to pattern, decr stance on Rt; decr hip/knee flexion     TODAY'S TREATMENT:   12/04/2022 Therex: Recumbent bike seat 7; L3 x 10 mins Leg press double leg 125 lbs x 15, single leg 62 lbs 2 x 15 bilateral knee extension machine double leg up, Rt leg lowering 2 x 15 20 lbs  Leg curl Rt leg only 20 lbs 2 x 15 Lateral step down slow focus 2 x 10 4 inch step bilateral  Neuro re-ed Tandem ambulation on foam in // bars occasional to moderate hand assist at times 6 ft x 6 each way Tandem stance 1 min x 1 bilateral on foam  SLS c vector reaching fwd, lateral, back light touch contralateral leg x 6 each way, performed bilaterally  11/29/2022 Therex: Recumbent bike seat 9; L3 x 10 mins knee extension machine double leg up, Rt leg lowering 2 x 10 20 lbs  Squat 15 lb kettle bell x 15  TherActivity Step on over and down 6 inch WB on Rt leg with single hand rail  assist x 15 Flight of stairs reciprocal gait up and down single hand rail assist for household entry    Neuro re-ed SLS on black mat c contralateral leg corner touching x 8 bilateral  11/26/22 Therex: Recumbent bike seat 7; L3 x 10 mins Leg Press 150# 3x10; then RLE only 100# 3x10 RLE only: knee extension 15# 3x10; knee flexion 25# 3x10 Squats with 15# KB 3x10 Deadlifts 3x10 with 15# KB SLS on Rt 5x30 sec with slight Lt abduction    PATIENT  EDUCATION:  Education details: HEP Person educated: Patient Education method: Consulting civil engineer, Media planner, and Handouts Education comprehension: verbalized understanding, returned demonstration, and needs further education   HOME EXERCISE PROGRAM: Access Code: [email or web address] URL: [email or web address] Date: 11/26/2022 Prepared by: Faustino Congress  Exercises - Quad Set  - 5-10 x daily - 7 x weekly - 1 sets - 5-10 reps - 5 sec hold - Supine Heel Slide with Strap  - 5-10 x daily - 7 x weekly - 1 sets - 5-10 reps - 5 sec hold - Seated Knee Flexion AAROM  - 3-5 x daily - 7 x weekly - 1 sets - 10 reps - 10 sec hold - Seated Knee Extension AROM  - 3-5 x daily - 7 x weekly - 1 sets - 10 reps - 5 sec hold - Seated Straight Leg Raise  - 3-5 x daily - 7 x weekly - 1 sets - 10 reps - 5 sec hold - Full Leg Press  - 1 x daily - 7 x weekly - 3 sets - 10 reps - Single Leg Press  - 1 x daily - 7 x weekly - 3 sets - 10 reps - Single Leg Hamstring Curl with Weight Machine  - 1 x daily - 7 x weekly - 3 sets - 10 reps - Single Leg Knee Extension with Weight Machine  - 1 x daily - 7 x weekly - 3 sets - 10 reps - Kettlebell Squat  - 1 x daily - 7 x weekly - 3 sets - 10 reps - Kettlebell Deadlift  - 1 x daily - 7 x weekly - 3 sets - 10 reps - Single Leg Stance  - 1 x daily - 7 x weekly - 1 sets - 5 reps - 30 sec hold   ASSESSMENT:   CLINICAL IMPRESSION: Continued overall improvement report by Pt and confidence in HEP.  Pt to return next visit  prior to MD visit same day.  Plan for reassessment and review of HEP for possible discharge due to progress.      OBJECTIVE IMPAIRMENTS: Abnormal gait, decreased activity tolerance, decreased balance, decreased knowledge of use of DME, decreased mobility, difficulty walking, decreased ROM, decreased strength, increased edema, increased fascial restrictions, increased muscle spasms, impaired flexibility, and pain.    ACTIVITY LIMITATIONS: carrying, lifting, standing, sleeping, stairs, transfers, and locomotion level   PARTICIPATION LIMITATIONS: cleaning, driving, community activity, occupation, and yard work   PERSONAL FACTORS: 3+ comorbidities: Morbid obesity, OA, HTN, OSA  are also affecting patient's functional outcome.    REHAB POTENTIAL: Good   CLINICAL DECISION MAKING: Evolving/moderate complexity   EVALUATION COMPLEXITY: Moderate     GOALS: Goals reviewed with patient? Yes   SHORT TERM GOALS: Target date: 11/26/2022   Independent with initial HEP Goal status: Met 11/21/2022   2.  Rt knee AROM 0-100 deg for improved function and mobility Goal status: on going - 11/21/2022   LONG TERM GOALS: Target date: 12/24/2022   Independent with final HEP Goal status: on going - 11/21/2022   2.  FOTO score improved to 75 Goal status: on going - 11/21/2022   3.  Rt knee AROM improved to 0-110 for improved function Goal status: on going - 11/21/2022   4.  Report pain < 3/10 with standing and walking activities for improved function Goal status: on going - 11/21/2022   5.  Amb without AD without significant deviations for improved mobility Goal status: MET 11/29/2022       PLAN:  PT FREQUENCY: 2x/week   PT DURATION: 8 weeks   PLANNED INTERVENTIONS: Therapeutic exercises, Therapeutic activity, Neuromuscular re-education, Balance training, Gait training, Patient/Family education, Self Care, Joint mobilization, Stair training, DME instructions, Aquatic Therapy, Dry Needling,  Electrical stimulation, Cryotherapy, Moist heat, Taping, Vasopneumatic device, Manual therapy, and Re-evaluation   PLAN FOR NEXT SESSION:  FOTO and update for MD. Probable discharge   Scot Jun, PT, DPT, OCS, ATC 12/04/22  12:13 PM

## 2022-12-06 ENCOUNTER — Encounter: Payer: 59 | Admitting: Physical Therapy

## 2022-12-10 ENCOUNTER — Ambulatory Visit (INDEPENDENT_AMBULATORY_CARE_PROVIDER_SITE_OTHER): Payer: 59 | Admitting: Physical Therapy

## 2022-12-10 ENCOUNTER — Encounter: Payer: Self-pay | Admitting: Physical Therapy

## 2022-12-10 ENCOUNTER — Ambulatory Visit (INDEPENDENT_AMBULATORY_CARE_PROVIDER_SITE_OTHER): Payer: 59 | Admitting: Orthopaedic Surgery

## 2022-12-10 ENCOUNTER — Encounter: Payer: Self-pay | Admitting: Orthopaedic Surgery

## 2022-12-10 DIAGNOSIS — R2681 Unsteadiness on feet: Secondary | ICD-10-CM | POA: Diagnosis not present

## 2022-12-10 DIAGNOSIS — M25561 Pain in right knee: Secondary | ICD-10-CM

## 2022-12-10 DIAGNOSIS — R2689 Other abnormalities of gait and mobility: Secondary | ICD-10-CM

## 2022-12-10 DIAGNOSIS — M25661 Stiffness of right knee, not elsewhere classified: Secondary | ICD-10-CM

## 2022-12-10 DIAGNOSIS — R600 Localized edema: Secondary | ICD-10-CM | POA: Diagnosis not present

## 2022-12-10 DIAGNOSIS — Z96651 Presence of right artificial knee joint: Secondary | ICD-10-CM

## 2022-12-10 NOTE — Progress Notes (Signed)
The patient is now 8 weeks out from a total knee arthroplasty for his right knee.  He is 61 years old.  He has been pushing himself through therapy as well.  He says the knee is doing well.  He does feel like he can return to full work duties without restrictions starting December 21.  On exam his extension is full and I am able to flex him past 90 degrees.  The knee feels ligamentously stable.  He will continue to increase his activities as comfort allows.  The next time I need to see him is not for 6 months unless there are issues.  Have a standing AP and lateral of his right operative knee at that visit.

## 2022-12-10 NOTE — Therapy (Addendum)
OUTPATIENT PHYSICAL THERAPY TREATMENT NOTE/ DISCHARGE   Patient Name: Charles Morales MRN: [id number] DOB:[birth year], 61 y.o., male Today's Date: 12/10/2022  PCP: Loura Pardon, MD  REFERRING PROVIDER: Mcarthur Rossetti, MD      END OF SESSION:   PT End of Session - 12/10/22 0921     Visit Number 12    Number of Visits 16    Date for PT Re-Evaluation 12/24/22    Authorization Type [employer] $25 copay, 60 visit limit    Progress Note Due on Visit 16    PT Start Time 0930    PT Stop Time 1000    PT Time Calculation (min) 30 min    Activity Tolerance Patient tolerated treatment well    Behavior During Therapy WFL for tasks assessed/performed                       Past Medical History:  Diagnosis Date   Allergy    Arthritis    right knee   Esophageal reflux    Family history of breast cancer    Family history of genetic disease carrier    sister NBN pathogenic variant   Family history of pancreatic cancer    Fatty liver    HLD (hyperlipidemia)    HTN (hypertension)    Morbid obesity ([place])    Pre-diabetes    Sleep apnea    USES CPAP    Tobacco use disorder    Unspecified sleep apnea    Past Surgical History:  Procedure Laterality Date   COLONOSCOPY  2013   COLONOSCOPY WITH PROPOFOL N/A 05/20/2018   Procedure: COLONOSCOPY WITH PROPOFOL;  Surgeon: Jerene Bears, MD;  Location: [facility];  Service: Gastroenterology;  Laterality: N/A;   KNEE ARTHROSCOPY     left   TOTAL KNEE ARTHROPLASTY Right 10/12/2022   Procedure: RIGHT TOTAL KNEE ARTHROPLASTY;  Surgeon: Mcarthur Rossetti, MD;  Location: [facility];  Service: Orthopedics;  Laterality: Right;   Patient Active Problem List   Diagnosis Date Noted   Status post total right knee replacement 10/12/2022   Current use of proton pump inhibitor 11/20/2021   Genetic testing 09/15/2018   Family history of genetic disease carrier 09/02/2018   Family history of breast cancer    Family history of  pancreatic cancer    Prostate cancer screening 08/17/2018   History of colonic polyps    Prediabetes 04/01/2016   Left ankle pain 10/14/2015   PVC's (premature ventricular contractions) 10/22/2013   Colon cancer screening 05/23/2012   Routine general medical examination at a health care facility 05/20/2012   Knee pain, right 06/08/2011   G E R D 08/04/2007   Hyperlipidemia 07/22/2007   Morbid obesity ([place]) 07/22/2007   SMOKELESS TOBACCO ABUSE 07/22/2007   Essential hypertension 07/22/2007   Sleep apnea 07/22/2007    REFERRING DIAG: Z96.651 (ICD-10-CM) - Status post total right knee replacement    THERAPY DIAG:  Acute pain of right knee  Localized edema  Unsteadiness on feet  Stiffness of right knee, not elsewhere classified  Other abnormalities of gait and mobility  Rationale for Evaluation and Treatment Rehabilitation  PERTINENT HISTORY: Morbid obesity, OA, HTN, OSA  PRECAUTIONS: None  SUBJECTIVE:    SUBJECTIVE STATEMENT: Doing well; feels like he can finish today.  PAIN:  NPRS scale: no pain upon arrival Pain location: Rt knee Pain description: tightness, aching, sore Aggravating factors: bending knee, prolonged positioning Relieving factors: ice, walking    WEIGHT BEARING  RESTRICTIONS: No   FALLS:  Has patient fallen in last 6 months? 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)   PLOF: Independent and Leisure: work, walking on treadmill occasionally, play golf   PATIENT GOALS: improve pain, regain use of RLE, return to work, play golf     OBJECTIVE:    PATIENT SURVEYS:  12/10/22: FOTO 83  11/21/2022 : FOTO update:  67  10/29/22: FOTO 63 (predicted 75)    EDEMA:  10/29/22 Circumferential: Knee Joint Line: Rt: 47.7 cm  /Lt: 44.5 cm   LOWER EXTREMITY ROM:   Active ROM Right 10/29/2022 Right 11/05/2022 Right 11/08/22 Right 11/12/22 Right  11/21/2022 Right 11/29/2022 Right 12/10/22  Knee flexion 79 98 in supine heel slide AROM  100 AA: 102 A: 100 AA: 104 AROM in supine heel slide 106 106 AROM in supine heel slide AA: 109 A: 109  Knee extension -3 (seated LAQ)  -3 in seated LAQ AROM -2 (seated LAQ)  -2 in seated LAQ  0 (seated LAQ)   (Blank rows = not tested)     Passive ROM Right 10/29/2022  Knee flexion 90  Knee extension -1   (Blank rows = not tested)       LOWER EXTREMITY MMT:   MMT Right 10/29/2022 Right 11/21/2022 Left 11/21/2022  Knee flexion 3-/5 5/5   Knee extension 3-/5 5/5 49, 52 lbs 5/5 45.8,49.1 lbs   (Blank rows = not tested)   FUNCTIONAL TESTS:  10/29/2022 Deferred today   GAIT: 11/21/2022:  independent c mild lacking TKE in stance, variable step length.   10/29/2022 Distance walked: 100' Assistive device utilized: Lobbyist Level of assistance: Modified independence Comments: step to pattern, decr stance on Rt; decr hip/knee flexion     TODAY'S TREATMENT:   12/10/22 Therex: Recumbent bike seat 8; L6 x 10 mins ROM measurements as noted above AA Rt heel slides x 20 reps Discussed HEP and community fitness; recommend strength training 3-4x/wk with active recovery days - pt verbalized understanding   12/04/2022 Therex: Recumbent bike seat 7; L3 x 10 mins Leg press double leg 125 lbs x 15, single leg 62 lbs 2 x 15 bilateral knee extension machine double leg up, Rt leg lowering 2 x 15 20 lbs  Leg curl Rt leg only 20 lbs 2 x 15 Lateral step down slow focus 2 x 10 4 inch step bilateral  Neuro re-ed Tandem ambulation on foam in // bars occasional to moderate hand assist at times 6 ft x 6 each way Tandem stance 1 min x 1 bilateral on foam  SLS c vector reaching fwd, lateral, back  light touch contralateral leg x 6 each way, performed bilaterally  11/29/2022 Therex: Recumbent bike seat 9; L3 x 10 mins knee extension machine double leg up, Rt leg lowering 2 x 10 20 lbs  Squat 15 lb kettle bell x 15  TherActivity Step on over and down 6 inch WB on Rt leg with single hand rail assist x 15 Flight of stairs reciprocal gait up and down single hand rail assist for household entry    Neuro re-ed SLS on black mat c contralateral leg corner touching x 8 bilateral  11/26/22 Therex: Recumbent bike seat  7; L3 x 10 mins Leg Press 150# 3x10; then RLE only 100# 3x10 RLE only: knee extension 15# 3x10; knee flexion 25# 3x10 Squats with 15# KB 3x10 Deadlifts 3x10 with 15# KB SLS on Rt 5x30 sec with slight Lt abduction    PATIENT EDUCATION:  Education details: HEP Person educated: Patient Education method: Consulting civil engineer, Media planner, and Handouts Education comprehension: verbalized understanding, returned demonstration, and needs further education   HOME EXERCISE PROGRAM: Access Code: [email or web address] URL: [email or web address] Date: 11/26/2022 Prepared by: Faustino Congress  Exercises - Quad Set  - 5-10 x daily - 7 x weekly - 1 sets - 5-10 reps - 5 sec hold - Supine Heel Slide with Strap  - 5-10 x daily - 7 x weekly - 1 sets - 5-10 reps - 5 sec hold - Seated Knee Flexion AAROM  - 3-5 x daily - 7 x weekly - 1 sets - 10 reps - 10 sec hold - Seated Knee Extension AROM  - 3-5 x daily - 7 x weekly - 1 sets - 10 reps - 5 sec hold - Seated Straight Leg Raise  - 3-5 x daily - 7 x weekly - 1 sets - 10 reps - 5 sec hold - Full Leg Press  - 1 x daily - 7 x weekly - 3 sets - 10 reps - Single Leg Press  - 1 x daily - 7 x weekly - 3 sets - 10 reps - Single Leg Hamstring Curl with Weight Machine  - 1 x daily - 7 x weekly - 3 sets - 10 reps - Single Leg Knee Extension with Weight Machine  - 1 x daily - 7 x weekly - 3 sets - 10 reps - Kettlebell Squat  - 1 x daily - 7 x  weekly - 3 sets - 10 reps - Kettlebell Deadlift  - 1 x daily - 7 x weekly - 3 sets - 10 reps - Single Leg Stance  - 1 x daily - 7 x weekly - 1 sets - 5 reps - 30 sec hold   ASSESSMENT:   CLINICAL IMPRESSION: Pt has met/nearly met all goals at this time.  Recommend d/c from PT, but to to see MD today so will await final recommendations.     OBJECTIVE IMPAIRMENTS: Abnormal gait, decreased activity tolerance, decreased balance, decreased knowledge of use of DME, decreased mobility, difficulty walking, decreased ROM, decreased strength, increased edema, increased fascial restrictions, increased muscle spasms, impaired flexibility, and pain.    ACTIVITY LIMITATIONS: carrying, lifting, standing, sleeping, stairs, transfers, and locomotion level   PARTICIPATION LIMITATIONS: cleaning, driving, community activity, occupation, and yard work   PERSONAL FACTORS: 3+ comorbidities: Morbid obesity, OA, HTN, OSA  are also affecting patient's functional outcome.    REHAB POTENTIAL: Good   CLINICAL DECISION MAKING: Evolving/moderate complexity   EVALUATION COMPLEXITY: Moderate     GOALS: Goals reviewed with patient? Yes   SHORT TERM GOALS: Target date: 11/26/2022   Independent with initial HEP Goal status: Met 11/21/2022   2.  Rt knee AROM 0-100 deg for improved function and mobility Goal status: on going - 11/21/2022 (met 11/08/22)   LONG TERM GOALS: Target date: 12/24/2022   Independent with final HEP Goal status: MET 12/10/22   2.  FOTO score improved to 75 Goal status: MET 12/10/22   3.  Rt knee AROM improved to 0-110 for improved function Goal status: Partially Met 12/10/22   4.  Report pain < 3/10 with standing and walking activities for improved function  Goal status: MET 12/10/22   5.  Amb without AD without significant deviations for improved mobility Goal status: MET 11/29/2022       PLAN:   PT FREQUENCY: 2x/week   PT DURATION: 8 weeks   PLANNED INTERVENTIONS:  Therapeutic exercises, Therapeutic activity, Neuromuscular re-education, Balance training, Gait training, Patient/Family education, Self Care, Joint mobilization, Stair training, DME instructions, Aquatic Therapy, Dry Needling, Electrical stimulation, Cryotherapy, Moist heat, Taping, Vasopneumatic device, Manual therapy, and Re-evaluation   PLAN FOR NEXT SESSION:  anticipate d/c; see what MD says    Laureen Abrahams, PT, DPT 12/10/22 10:02 AM  PHYSICAL THERAPY DISCHARGE SUMMARY  Visits from Start of Care: 12  Current functional level related to goals / functional outcomes: See note   Remaining deficits: See note   Education / Equipment: HEP  Patient goals were met. Patient is being discharged due to meeting the stated rehab goals.  Scot Jun, PT, DPT, OCS, ATC 01/10/23  10:35 AM

## 2022-12-13 ENCOUNTER — Encounter: Payer: 59 | Admitting: Physical Therapy

## 2022-12-13 ENCOUNTER — Ambulatory Visit (INDEPENDENT_AMBULATORY_CARE_PROVIDER_SITE_OTHER): Payer: 59 | Admitting: Family Medicine

## 2022-12-13 VITALS — BP 124/82 | HR 77 | Temp 97.9°F | Ht 70.0 in | Wt 327.1 lb

## 2022-12-13 DIAGNOSIS — Z125 Encounter for screening for malignant neoplasm of prostate: Secondary | ICD-10-CM | POA: Diagnosis not present

## 2022-12-13 DIAGNOSIS — I10 Essential (primary) hypertension: Secondary | ICD-10-CM

## 2022-12-13 DIAGNOSIS — G4733 Obstructive sleep apnea (adult) (pediatric): Secondary | ICD-10-CM

## 2022-12-13 DIAGNOSIS — Z79899 Other long term (current) drug therapy: Secondary | ICD-10-CM | POA: Diagnosis not present

## 2022-12-13 DIAGNOSIS — Z0000 Encounter for general adult medical examination without abnormal findings: Secondary | ICD-10-CM | POA: Diagnosis not present

## 2022-12-13 DIAGNOSIS — Z1211 Encounter for screening for malignant neoplasm of colon: Secondary | ICD-10-CM

## 2022-12-13 DIAGNOSIS — E7800 Pure hypercholesterolemia, unspecified: Secondary | ICD-10-CM

## 2022-12-13 DIAGNOSIS — Z23 Encounter for immunization: Secondary | ICD-10-CM | POA: Diagnosis not present

## 2022-12-13 DIAGNOSIS — R7303 Prediabetes: Secondary | ICD-10-CM

## 2022-12-13 DIAGNOSIS — K219 Gastro-esophageal reflux disease without esophagitis: Secondary | ICD-10-CM

## 2022-12-13 LAB — CBC WITH DIFFERENTIAL/PLATELET
Basophils Absolute: 0 10*3/uL (ref 0.0–0.1)
Basophils Relative: 0.3 % (ref 0.0–3.0)
Eosinophils Absolute: 0.1 10*3/uL (ref 0.0–0.7)
Eosinophils Relative: 1.4 % (ref 0.0–5.0)
HCT: 45.4 % (ref 39.0–52.0)
Hemoglobin: 15.3 g/dL (ref 13.0–17.0)
Lymphocytes Relative: 25 % (ref 12.0–46.0)
Lymphs Abs: 1.4 10*3/uL (ref 0.7–4.0)
MCHC: 33.8 g/dL (ref 30.0–36.0)
MCV: 90.5 fl (ref 78.0–100.0)
Monocytes Absolute: 0.5 10*3/uL (ref 0.1–1.0)
Monocytes Relative: 9.5 % (ref 3.0–12.0)
Neutro Abs: 3.5 10*3/uL (ref 1.4–7.7)
Neutrophils Relative %: 63.8 % (ref 43.0–77.0)
Platelets: 188 10*3/uL (ref 150.0–400.0)
RBC: 5.02 Mil/uL (ref 4.22–5.81)
RDW: 15.1 % (ref 11.5–15.5)
WBC: 5.5 10*3/uL (ref 4.0–10.5)

## 2022-12-13 LAB — COMPREHENSIVE METABOLIC PANEL
ALT: 16 U/L (ref 0–53)
AST: 14 U/L (ref 0–37)
Albumin: 4.2 g/dL (ref 3.5–5.2)
Alkaline Phosphatase: 62 U/L (ref 39–117)
BUN: 15 mg/dL (ref 6–23)
CO2: 31 mEq/L (ref 19–32)
Calcium: 9.4 mg/dL (ref 8.4–10.5)
Chloride: 101 mEq/L (ref 96–112)
Creatinine, Ser: 0.89 mg/dL (ref 0.40–1.50)
GFR: 93.27 mL/min (ref >60.00)
Glucose, Bld: 135 mg/dL — ABNORMAL HIGH (ref 70–99)
Potassium: 4 mEq/L (ref 3.5–5.1)
Sodium: 139 mEq/L (ref 135–145)
Total Bilirubin: 0.6 mg/dL (ref 0.2–1.2)
Total Protein: 7.1 g/dL (ref 6.0–8.3)

## 2022-12-13 LAB — LIPID PANEL
Cholesterol: 112 mg/dL (ref 0–200)
HDL: 36.3 mg/dL — ABNORMAL LOW (ref >39.00)
LDL Cholesterol: 61 mg/dL (ref 0–99)
NonHDL: 75.7
Total CHOL/HDL Ratio: 3
Triglycerides: 73 mg/dL (ref 0.0–149.0)
VLDL: 14.6 mg/dL (ref 0.0–40.0)

## 2022-12-13 LAB — HEMOGLOBIN A1C: Hgb A1c MFr Bld: 5.7 % (ref 4.6–6.5)

## 2022-12-13 LAB — PSA: PSA: 2.33 ng/mL (ref 0.10–4.00)

## 2022-12-13 LAB — TSH: TSH: 0.7 u[IU]/mL (ref 0.35–5.50)

## 2022-12-13 LAB — VITAMIN B12: Vitamin B-12: 614 pg/mL (ref 211–911)

## 2022-12-13 MED ORDER — LISINOPRIL-HYDROCHLOROTHIAZIDE 20-25 MG PO TABS: 1.0000 | ORAL_TABLET | Freq: Every day | ORAL | 3 refills | Status: DC

## 2022-12-13 MED ORDER — OMEPRAZOLE 40 MG PO CPDR: 40.0000 mg | DELAYED_RELEASE_CAPSULE | Freq: Every day | ORAL | 3 refills | Status: DC

## 2022-12-13 MED ORDER — ROSUVASTATIN CALCIUM 5 MG PO TABS: 5.0000 mg | ORAL_TABLET | Freq: Every day | ORAL | 3 refills | Status: DC

## 2022-12-13 NOTE — Assessment & Plan Note (Signed)
A1c ordered Prior 6.0  Wt loss and better habits since then, commended  disc imp of low glycemic diet and wt loss to prevent DM2

## 2022-12-13 NOTE — Assessment & Plan Note (Signed)
Per pt colonoscopy was 2019 with 10 y recall (not 5)

## 2022-12-13 NOTE — Assessment & Plan Note (Signed)
Reviewed health habits including diet and exercise and skin cancer prevention Reviewed appropriate screening tests for age  Also reviewed health mt list, fam hx and immunization status , as well as social and family history   See HPI Labs ordered Wt loss commended Td updated Flu shot given  Colonoscopy 2019 with 10 y recall Psa ordered

## 2022-12-13 NOTE — Assessment & Plan Note (Signed)
bp in fair control at this time  BP Readings from Last 1 Encounters:  12/13/22 124/82   No changes needed Most recent labs reviewed  Disc lifstyle change with low sodium diet and exercise  Plan to continue lisinopril hct 20-25 mg daily Labs today

## 2022-12-13 NOTE — Patient Instructions (Addendum)
Flu shot and tetanus shot today   Keep up the exercise  Add some weight training   Keep up the good work with diet and weight loss Try to get most of your carbohydrates from produce (with the exception of white potatoes)  Eat less bread/pasta/rice/snack foods/cereals/sweets and other items from the middle of the grocery store (processed carbs)

## 2022-12-13 NOTE — Progress Notes (Signed)
Subjective:    Patient ID: Charles Morales, male    DOB: Jan 03, 1961, 61 y.o.   MRN: [id number]  HPI Here for health maintenance exam and to review chronic medical problems    Wt Readings from Last 3 Encounters:  12/13/22 (!) 327 lb 2 oz (148.4 kg)  11/30/22 (!) 328 lb 12.8 oz (149.1 kg)  10/12/22 (!) 337 lb (152.9 kg)   46.94 kg/m Wt loss so far has really helped him feel better  Wt was 375 lb a year ago  He dropped 25-30 lb before his surgery    Had a knee replacement  Doing much better!   Still in recovery mode  Was released on Monday  Doing PT = has home program   A little walking-getting started with  Has a gym [facility] the [facility] [employer]-uses it 2-3 times per week (wife can go with him)    Immunization History  Administered Date(s) Administered   Influenza Whole 11/11/2009   Influenza,inj,Quad PF,6+ Mos 10/14/2015, 11/12/2016, 11/13/2019, 11/17/2020, 11/20/2021   Moderna Sars-Covid-2 Vaccination 02/03/2020, 03/02/2020, 12/20/2020   Td 12/31/2000   Tdap 05/23/2012   Zoster Recombinat (Shingrix) 12/04/2018, 02/19/2019   Health Maintenance Due  Topic Date Due   DTaP/Tdap/Td (3 - Td or Tdap) 05/23/2022   Tdap 04/2012- due for tetanus shot   Flu shot : today   Colonoscopy 04/2018 with 10 years- is entered wrong on health mt list    Prostate health  Lab Results  Component Value Date   PSA 1.12 11/20/2021   PSA 1.36 10/23/2019   PSA 1.67 08/18/2018   No problems  Nocturia - 0-1 times per night  No prostate cancer in family     HTN bp is stable today  No cp or palpitations or headaches or edema  No side effects to medicines  BP Readings from Last 3 Encounters:  12/13/22 124/82  11/30/22 138/88  10/13/22 (!) 143/92    Pulse Readings from Last 3 Encounters:  12/13/22 77  11/30/22 78  10/13/22 66    Lisinopril  hct 20-25 mg daily   Lab Results  Component Value Date   CREATININE 0.82 10/13/2022   BUN 13 10/13/2022   NA 138 10/13/2022   K 4.3  10/13/2022   CL 104 10/13/2022   CO2 29 10/13/2022     OSA using cpap - doing well with that  Doing well with that   GERD Omeprazole 40 mg daily  Lab Results  Component Value Date   VITAMINB12 704 11/20/2021   Hyperlipidemia  Lab Results  Component Value Date   CHOL 120 11/20/2021   HDL 38.30 (L) 11/20/2021   LDLCALC 65 11/20/2021   TRIG 88.0 11/20/2021   CHOLHDL 3 11/20/2021   Taking crestor 5 mg daily   Prediabetes Lab Results  Component Value Date   HGBA1C 6.0 11/20/2021   Diet :  Good and bad days  Uses myfitness pal app - counting calories  Went to [employer] program and that was helpful also   Sticking to higher protein and lower carb diet  Is mindful of that  In general less sweets  Cut out sweet tea and soda and bread   Patient Active Problem List   Diagnosis Date Noted   Status post total right knee replacement 10/12/2022   Current use of proton pump inhibitor 11/20/2021   Genetic testing 09/15/2018   Family history of genetic disease carrier 09/02/2018   Family history of breast cancer    Family  history of pancreatic cancer    Prostate cancer screening 08/17/2018   History of colonic polyps    Prediabetes 04/01/2016   Left ankle pain 10/14/2015   PVC's (premature ventricular contractions) 10/22/2013   Colon cancer screening 05/23/2012   Routine general medical examination at a health care facility 05/20/2012   Knee pain, right 06/08/2011   G E R D 08/04/2007   Hyperlipidemia 07/22/2007   Morbid obesity ([place]) 07/22/2007   SMOKELESS TOBACCO ABUSE 07/22/2007   Essential hypertension 07/22/2007   Sleep apnea 07/22/2007   Past Medical History:  Diagnosis Date   Allergy    Arthritis    right knee   Esophageal reflux    Family history of breast cancer    Family history of genetic disease carrier    sister NBN pathogenic variant   Family history of pancreatic cancer    Fatty liver    HLD (hyperlipidemia)    HTN (hypertension)    Morbid  obesity ([place])    Pre-diabetes    Sleep apnea    USES CPAP    Tobacco use disorder    Unspecified sleep apnea    Past Surgical History:  Procedure Laterality Date   COLONOSCOPY  2013   COLONOSCOPY WITH PROPOFOL N/A 05/20/2018   Procedure: COLONOSCOPY WITH PROPOFOL;  Surgeon: Jerene Bears, MD;  Location: [facility];  Service: Gastroenterology;  Laterality: N/A;   KNEE ARTHROSCOPY     left   TOTAL KNEE ARTHROPLASTY Right 10/12/2022   Procedure: RIGHT TOTAL KNEE ARTHROPLASTY;  Surgeon: Mcarthur Rossetti, MD;  Location: [facility];  Service: Orthopedics;  Laterality: Right;   Social History   Tobacco Use   Smoking status: Former    Packs/day: 1.00    Years: 4.00    Total pack years: 4.00    Types: Cigarettes    Quit date: 1993    Years since quitting: 30.9   Smokeless tobacco: Current    Types: Snuff  Vaping Use   Vaping Use: Never used  Substance Use Topics   Alcohol use: Yes    Comment: rare   Drug use: No   Family History  Problem Relation Age of Onset   Hypertension Mother    Heart attack Mother        deceased   Heart disease Mother    Kidney disease Mother    Heart attack Father 57       deceased   Heart disease Father    Breast cancer Sister 69       NBN +   Lung cancer Brother    Kidney disease Maternal Grandfather    Colon cancer Neg Hx    Stomach cancer Neg Hx    Esophageal cancer Neg Hx    Rectal cancer Neg Hx    No Known Allergies Current Outpatient Medications on File Prior to Visit  Medication Sig Dispense Refill   ALPRAZolam (XANAX) 0.5 MG tablet Take 1 tablet (0.5 mg total) by mouth daily as needed for anxiety. 30 tablet 0   cetirizine (ZYRTEC) 10 MG tablet Take 10 mg by mouth daily.     cyanocobalamin (VITAMIN B12) 1000 MCG tablet Take 1,000 mcg by mouth daily.     diclofenac Sodium (VOLTAREN) 1 % GEL Apply 1 Application topically 4 (four) times daily as needed (pain).     diphenhydrAMINE HCl, Sleep, (ZZZQUIL) 50 MG/30ML LIQD Take 50 mg by  mouth at bedtime as needed (sleep).     GLUCOSAMINE-CHONDROITIN PO Take 1 tablet  by mouth 2 (two) times daily.     methocarbamol (ROBAXIN) 500 MG tablet Take 1 tablet (500 mg total) by mouth every 6 (six) hours as needed for muscle spasms. 60 tablet 1   NON FORMULARY CPAP at night as directed      Omega-3 Fatty Acids (FISH OIL) 1200 MG CPDR Take 2,400 mg by mouth 2 (two) times daily.     Oxymetazoline HCl (VICKS SINEX NA) Place 1 spray into the nose daily as needed (allergies).     No current facility-administered medications on file prior to visit.     Review of Systems  Constitutional:  Negative for activity change, appetite change, fatigue, fever and unexpected weight change.  HENT:  Negative for congestion, rhinorrhea, sore throat and trouble swallowing.   Eyes:  Negative for pain, redness, itching and visual disturbance.  Respiratory:  Negative for cough, chest tightness, shortness of breath and wheezing.   Cardiovascular:  Negative for chest pain and palpitations.  Gastrointestinal:  Negative for abdominal pain, blood in stool, constipation, diarrhea and nausea.  Endocrine: Negative for cold intolerance, heat intolerance, polydipsia and polyuria.  Genitourinary:  Negative for difficulty urinating, dysuria, frequency and urgency.  Musculoskeletal:  Positive for arthralgias. Negative for joint swelling and myalgias.  Skin:  Negative for pallor and rash.  Neurological:  Negative for dizziness, tremors, weakness, numbness and headaches.  Hematological:  Negative for adenopathy. Does not bruise/bleed easily.  Psychiatric/Behavioral:  Negative for decreased concentration and dysphoric mood. The patient is not nervous/anxious.        Objective:   Physical Exam Constitutional:      General: He is not in acute distress.    Appearance: Normal appearance. He is well-developed. He is obese. He is not ill-appearing or diaphoretic.  HENT:     Head: Normocephalic and atraumatic.     Right  Ear: Tympanic membrane, ear canal and external ear normal.     Left Ear: Tympanic membrane, ear canal and external ear normal.     Nose: Nose normal. No congestion.     Mouth/Throat:     Mouth: Mucous membranes are moist.     Pharynx: Oropharynx is clear. No posterior oropharyngeal erythema.  Eyes:     General: No scleral icterus.       Right eye: No discharge.        Left eye: No discharge.     Conjunctiva/sclera: Conjunctivae normal.     Pupils: Pupils are equal, round, and reactive to light.  Neck:     Thyroid: No thyromegaly.     Vascular: No carotid bruit or JVD.  Cardiovascular:     Rate and Rhythm: Normal rate and regular rhythm.     Pulses: Normal pulses.     Heart sounds: Normal heart sounds.     No gallop.  Pulmonary:     Effort: Pulmonary effort is normal. No respiratory distress.     Breath sounds: Normal breath sounds. No wheezing or rales.     Comments: Good air exch Chest:     Chest wall: No tenderness.  Abdominal:     General: Bowel sounds are normal. There is no distension or abdominal bruit.     Palpations: Abdomen is soft. There is no mass.     Tenderness: There is no abdominal tenderness.     Hernia: No hernia is present.  Musculoskeletal:        General: No tenderness.     Cervical back: Normal range of motion and neck supple.  No rigidity. No muscular tenderness.     Right lower leg: No edema.     Left lower leg: No edema.  Lymphadenopathy:     Cervical: No cervical adenopathy.  Skin:    General: Skin is warm and dry.     Coloration: Skin is not pale.     Findings: No erythema or rash.     Comments: Fair  Solar lentigines diffusely   Neurological:     Mental Status: He is alert.     Cranial Nerves: No cranial nerve deficit.     Motor: No abnormal muscle tone.     Coordination: Coordination normal.     Gait: Gait normal.     Deep Tendon Reflexes: Reflexes are normal and symmetric. Reflexes normal.  Psychiatric:        Mood and Affect: Mood  normal.        Cognition and Memory: Cognition normal.           Assessment & Plan:   Problem List Items Addressed This Visit       Cardiovascular and Mediastinum   Essential hypertension    bp in fair control at this time  BP Readings from Last 1 Encounters:  12/13/22 124/82  No changes needed Most recent labs reviewed  Disc lifstyle change with low sodium diet and exercise  Plan to continue lisinopril hct 20-25 mg daily Labs today      Relevant Medications   lisinopril-hydrochlorothiazide (ZESTORETIC) 20-25 MG tablet   rosuvastatin (CRESTOR) 5 MG tablet   Other Relevant Orders   TSH   Lipid panel   Comprehensive metabolic panel   CBC with Differential/Platelet     Respiratory   Sleep apnea    Using cpap Much improved Feels better        Digestive   G E R D    Taking omeprazole 40 daily and has to take regularly  With continued wt loss hope we would be able to wean this       Relevant Medications   omeprazole (PRILOSEC) 40 MG capsule     Other   Colon cancer screening    Per pt colonoscopy was 2019 with 10 y recall (not 5)        Current use of proton pump inhibitor    B12 level today  Taking 1000 mcg B12 daily      Relevant Orders   Vitamin B12   Hyperlipidemia    Disc goals for lipids and reasons to control them Rev last labs with pt Rev low sat fat diet in detail  Takes crestor 5 mg daily  Diet is improved Labs ordered        Relevant Medications   lisinopril-hydrochlorothiazide (ZESTORETIC) 20-25 MG tablet   rosuvastatin (CRESTOR) 5 MG tablet   Morbid obesity ([place])    Discussed how this problem influences overall health and the risks it imposes  Reviewed plan for weight loss with lower calorie diet (via better food choices and also portion control or program like weight watchers) and exercise building up to or more than 30 minutes 5 days per week including some aerobic activity   Commended wt loss so far Enc to continue  exercise/ gym Better with knee repl Enc him to continue low glycemic diet       Prediabetes    A1c ordered Prior 6.0  Wt loss and better habits since then, commended  disc imp of low glycemic diet and wt loss to prevent DM2  Relevant Orders   Hemoglobin A1c   Prostate cancer screening    Psa ordered No voiding changes No fam h/o prostate cancer       Relevant Orders   PSA   Routine general medical examination at a health care facility - Primary    Reviewed health habits including diet and exercise and skin cancer prevention Reviewed appropriate screening tests for age  Also reviewed health mt list, fam hx and immunization status , as well as social and family history   See HPI Labs ordered Wt loss commended Td updated Flu shot given  Colonoscopy 2019 with 10 y recall Psa ordered       Relevant Orders   Flu Vaccine QUAD 6+ mos PF IM (Fluarix Quad PF) (Completed)   Td : Tetanus/diphtheria >7yo Preservative  free (Completed)   Other Visit Diagnoses     Need for influenza vaccination       Relevant Orders   Flu Vaccine QUAD 6+ mos PF IM (Fluarix Quad PF) (Completed)   Need for Td vaccine       Relevant Orders   Td : Tetanus/diphtheria >7yo Preservative  free (Completed)

## 2022-12-13 NOTE — Assessment & Plan Note (Signed)
Discussed how this problem influences overall health and the risks it imposes  Reviewed plan for weight loss with lower calorie diet (via better food choices and also portion control or program like weight watchers) and exercise building up to or more than 30 minutes 5 days per week including some aerobic activity   Commended wt loss so far Enc to continue exercise/ gym Better with knee repl Enc him to continue low glycemic diet

## 2022-12-13 NOTE — Assessment & Plan Note (Signed)
Taking omeprazole 40 daily and has to take regularly  With continued wt loss hope we would be able to wean this

## 2022-12-13 NOTE — Assessment & Plan Note (Signed)
Using cpap Much improved Feels better

## 2022-12-13 NOTE — Assessment & Plan Note (Signed)
B12 level today  Taking 1000 mcg B12 daily

## 2022-12-13 NOTE — Assessment & Plan Note (Addendum)
Disc goals for lipids and reasons to control them Rev last labs with pt Rev low sat fat diet in detail  Takes crestor 5 mg daily  Diet is improved Labs ordered

## 2022-12-13 NOTE — Assessment & Plan Note (Signed)
Psa ordered No voiding changes No fam h/o prostate cancer

## 2022-12-17 ENCOUNTER — Encounter: Payer: 59 | Admitting: Rehabilitative and Restorative Service Providers"

## 2022-12-19 ENCOUNTER — Encounter: Payer: 59 | Admitting: Rehabilitative and Restorative Service Providers"

## 2022-12-26 ENCOUNTER — Encounter: Payer: 59 | Admitting: Rehabilitative and Restorative Service Providers"

## 2023-03-07 ENCOUNTER — Encounter: Payer: Self-pay | Admitting: Radiology

## 2023-06-10 ENCOUNTER — Other Ambulatory Visit (INDEPENDENT_AMBULATORY_CARE_PROVIDER_SITE_OTHER): Payer: 59

## 2023-06-10 ENCOUNTER — Ambulatory Visit: Payer: 59 | Admitting: Orthopaedic Surgery

## 2023-06-10 ENCOUNTER — Encounter: Payer: Self-pay | Admitting: Orthopaedic Surgery

## 2023-06-10 DIAGNOSIS — Z96651 Presence of right artificial knee joint: Secondary | ICD-10-CM | POA: Diagnosis not present

## 2023-06-10 NOTE — Progress Notes (Signed)
The patient is 8 months status post a right total knee arthroplasty.  He reports he is doing well and has good range of motion and strength with the knee but only some discomfort after a lot of activity.  He is someone who is morbidly obese and he is still on a weight loss journey.  He is only 62 years old.  He does have arthritis in his left knee mainly over the lateral aspect of the knee but right now it is manageable.  His right knee shows no effusion today.  The range of motion is full and it feels ligamentously stable.  2 views of the right knee show well-seated press-fit implant with no evidence of loosening or complicating features.  At this point follow-up for his right knee is as needed.  If he does develop any issues with that he knows to let us know.  If his left knee starts giving him enough problems he would need to come in and see [place] and we will get new x-rays of his both knees.  All questions and concerns were answered and addressed.

## 2023-06-11 ENCOUNTER — Ambulatory Visit: Payer: 59 | Admitting: Orthopaedic Surgery

## 2023-09-25 ENCOUNTER — Encounter: Payer: Self-pay | Admitting: Family Medicine

## 2023-10-02 ENCOUNTER — Ambulatory Visit: Payer: 59 | Admitting: Family Medicine

## 2023-10-02 ENCOUNTER — Encounter: Payer: Self-pay | Admitting: Family Medicine

## 2023-10-02 VITALS — BP 136/78 | HR 65 | Temp 97.6°F | Ht 70.0 in | Wt 349.2 lb

## 2023-10-02 DIAGNOSIS — Z0289 Encounter for other administrative examinations: Secondary | ICD-10-CM | POA: Diagnosis not present

## 2023-10-02 DIAGNOSIS — Z23 Encounter for immunization: Secondary | ICD-10-CM

## 2023-10-02 DIAGNOSIS — R7303 Prediabetes: Secondary | ICD-10-CM

## 2023-10-02 DIAGNOSIS — I10 Essential (primary) hypertension: Secondary | ICD-10-CM | POA: Diagnosis not present

## 2023-10-02 NOTE — Assessment & Plan Note (Signed)
In light of chronic health problems incl morbid obesity, HTN, prediabetes   He is unlimited re: work duties at [employer] despite these problems  Can walk unlimited amount (improved after knee repl) , can walk stairs and lift without issues   No cardiac problems noted Uses cpap for osa   No restriction noted for work/ letter done

## 2023-10-02 NOTE — Patient Instructions (Addendum)
Try to get most of your carbohydrates from produce (with the exception of white potatoes) and whole grains Eat less bread/pasta/rice/snack foods/cereals/sweets and other items from the middle of the grocery store (processed carbs)  Flu shot today   Start working in exercise  Do something to raise heart rate / cardio Add some strength training to your routine, this is important for bone and brain health and can reduce your risk of falls and help your body use insulin properly and regulate weight  Light weights, exercise bands , and internet videos are a good way to start  Yoga (chair or regular), machines , floor exercises or a gym with machines are also good options   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   [employer] is a good online program  If you want a referral to the healthy weight and wellness center

## 2023-10-02 NOTE — Assessment & Plan Note (Signed)
A1c ordered Prior 6.0  Plan to re check next mo at annual exam  May be up due to weight gain   disc imp of low glycemic diet and wt loss to prevent DM2

## 2023-10-02 NOTE — Assessment & Plan Note (Signed)
bp in fair control at this time (despite weight gain) BP Readings from Last 1 Encounters:  10/02/23 136/78   No changes needed Most recent labs reviewed  Disc lifstyle change with low sodium diet and exercise  Plan to continue lisinopril hct 20-25 mg daily Labs today

## 2023-10-02 NOTE — Progress Notes (Signed)
Subjective:    Patient ID: Charles Morales, male    DOB: 1961/06/02, 62 y.o.   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   Put some weight back on since his last visit  Had knee replacement and could not exercise for a while Then working night shifts (sleeps during the day)   Eats too big portions   Schedule forces him to eat on the run- fast food   Knows he can do it/ has done it before  Has the myfitness pal app   Wife is supportive as well   Stressors  Work  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, 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[employer], 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 Tends to help somone else instead of helping himself   Goal is to start with 3 d per week of exercise (am or pm)  Had 4 days off per week  Wife wants to start also   Has a treadmill and bike/elliptical combo   Needs a letter saying he is fit for duty for 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  He commands /guides more than the physical work   He can walk at moderate level -unlimited since knee replacement  Can walk stairs also  Not required to run     HTN bp is stable today  No cp or palpitations or headaches or edema  No side effects to medicines  BP Readings from Last 3 Encounters:  10/02/23 136/78  12/13/22 124/82  11/30/22 138/88    Lisinopril hct 20-25 mg   OSA Uses cpap   Hyperlipidemia Lab Results  Component Value Date   CHOL 112 12/13/2022   HDL 36.30 (L) 12/13/2022   LDLCALC 61 12/13/2022   TRIG 73.0 12/13/2022   CHOLHDL 3 12/13/2022   Crestor 5 mg daily   Prediabetes Lab Results  Component Value Date   HGBA1C 5.7 12/13/2022      Patient Active Problem List   Diagnosis Date  Noted   Encounter for physical examination related to employment 10/02/2023   Status post total right knee replacement 10/12/2022   Current use of proton pump inhibitor 11/20/2021   Genetic testing 09/15/2018   Family history of genetic disease carrier 09/02/2018   Family history of breast cancer    Family history of pancreatic cancer    Prostate cancer screening 08/17/2018   History of colonic polyps    Prediabetes 04/01/2016   Left ankle pain 10/14/2015   PVC's (premature ventricular contractions) 10/22/2013   Colon cancer screening 05/23/2012   Routine general medical examination at a health care facility 05/20/2012   Knee pain, right 06/08/2011   G E R D 08/04/2007   Hyperlipidemia 07/22/2007   Morbid obesity (HCC) 07/22/2007   SMOKELESS TOBACCO ABUSE 07/22/2007   Essential hypertension 07/22/2007   Sleep apnea 07/22/2007   Past Medical History:  Diagnosis Date   Allergy    Arthritis    right knee   Esophageal reflux    Family history of breast cancer    Family history of genetic disease  carrier    sister NBN pathogenic variant   Family history of pancreatic cancer    Fatty liver    HLD (hyperlipidemia)    HTN (hypertension)    Morbid obesity (HCC)    Pre-diabetes    Sleep apnea    USES CPAP    Tobacco use disorder    Unspecified sleep apnea    Past Surgical History:  Procedure Laterality Date   COLONOSCOPY  2013   COLONOSCOPY WITH PROPOFOL N/A 05/20/2018   Procedure: COLONOSCOPY WITH PROPOFOL;  Surgeon: Beverley Fiedler, MD;  Location: [facility];  Service: Gastroenterology;  Laterality: N/A;   KNEE ARTHROSCOPY     left   TOTAL KNEE ARTHROPLASTY Right 10/12/2022   Procedure: RIGHT TOTAL KNEE ARTHROPLASTY;  Surgeon: Kathryne Hitch, MD;  Location: [facility];  Service: Orthopedics;  Laterality: Right;   Social History   Tobacco Use   Smoking status: Former    Current packs/day: 0.00    Average packs/day: 1 pack/day for 4.0 years (4.0 ttl pk-yrs)     Types: Cigarettes    Start date: 50    Quit date: 62    Years since quitting: 31.7   Smokeless tobacco: Current    Types: Snuff  Vaping Use   Vaping status: Never Used  Substance Use Topics   Alcohol use: Yes    Comment: rare   Drug use: No   Family History  Problem Relation Age of Onset   Hypertension Mother    Heart attack Mother        deceased   Heart disease Mother    Kidney disease Mother    Heart attack Father 45       deceased   Heart disease Father    Breast cancer Sister 61       NBN +   Lung cancer Brother    Kidney disease Maternal Grandfather    Colon cancer Neg Hx    Stomach cancer Neg Hx    Esophageal cancer Neg Hx    Rectal cancer Neg Hx    No Known Allergies Current Outpatient Medications on File Prior to Visit  Medication Sig Dispense Refill   ALPRAZolam (XANAX) 0.5 MG tablet Take 1 tablet (0.5 mg total) by mouth daily as needed for anxiety. 30 tablet 0   cetirizine (ZYRTEC) 10 MG tablet Take 10 mg by mouth daily.     cyanocobalamin (VITAMIN B12) 1000 MCG tablet Take 1,000 mcg by mouth daily.     diclofenac Sodium (VOLTAREN) 1 % GEL Apply 1 Application topically 4 (four) times daily as needed (pain).     diphenhydrAMINE HCl, Sleep, (ZZZQUIL) 50 MG/30ML LIQD Take 50 mg by mouth at bedtime as needed (sleep).     GLUCOSAMINE-CHONDROITIN PO Take 1 tablet by mouth 2 (two) times daily.     lisinopril-hydrochlorothiazide (ZESTORETIC) 20-25 MG tablet Take 1 tablet by mouth daily. 90 tablet 3   methocarbamol (ROBAXIN) 500 MG tablet Take 1 tablet (500 mg total) by mouth every 6 (six) hours as needed for muscle spasms. 60 tablet 1   NON FORMULARY CPAP at night as directed      Omega-3 Fatty Acids (FISH OIL) 1200 MG CPDR Take 2,400 mg by mouth 2 (two) times daily.     omeprazole (PRILOSEC) 40 MG capsule Take 1 capsule (40 mg total) by mouth daily. 90 capsule 3   Oxymetazoline HCl (VICKS SINEX NA) Place 1 spray into the nose daily as needed (allergies).  rosuvastatin (CRESTOR) 5 MG tablet Take 1 tablet (5 mg total) by mouth daily. 90 tablet 3   No current facility-administered medications on file prior to visit.    Review of Systems  Constitutional:  Negative for activity change, appetite change, fatigue, fever and unexpected weight change.  HENT:  Negative for congestion, rhinorrhea, sore throat and trouble swallowing.   Eyes:  Negative for pain, redness, itching and visual disturbance.  Respiratory:  Negative for cough, chest tightness, shortness of breath and wheezing.   Cardiovascular:  Negative for chest pain and palpitations.  Gastrointestinal:  Negative for abdominal pain, blood in stool, constipation, diarrhea and nausea.  Endocrine: Negative for cold intolerance, heat intolerance, polydipsia and polyuria.  Genitourinary:  Negative for difficulty urinating, dysuria, frequency and urgency.  Musculoskeletal:  Positive for arthralgias. Negative for joint swelling and myalgias.  Skin:  Negative for pallor and rash.  Neurological:  Negative for dizziness, tremors, weakness, numbness and headaches.  Hematological:  Negative for adenopathy. Does not bruise/bleed easily.  Psychiatric/Behavioral:  Negative for decreased concentration and dysphoric mood. The patient is not nervous/anxious.        Objective:   Physical Exam Constitutional:      General: He is not in acute distress.    Appearance: Normal appearance. He is well-developed. He is obese. He is not ill-appearing or diaphoretic.     Comments: Able to walk unassisted Able to rise from chair unassisted and get on table   HENT:     Head: Normocephalic and atraumatic.     Mouth/Throat:     Mouth: Mucous membranes are moist.  Eyes:     General: No scleral icterus.    Conjunctiva/sclera: Conjunctivae normal.     Pupils: Pupils are equal, round, and reactive to light.  Neck:     Thyroid: No thyromegaly.     Vascular: No carotid bruit or JVD.  Cardiovascular:     Rate and  Rhythm: Normal rate and regular rhythm.     Heart sounds: Normal heart sounds.     No gallop.  Pulmonary:     Effort: Pulmonary effort is normal. No respiratory distress.     Breath sounds: Normal breath sounds. No wheezing or rales.  Abdominal:     General: There is no distension or abdominal bruit.     Palpations: Abdomen is soft.  Musculoskeletal:     Cervical back: Normal range of motion and neck supple.     Right lower leg: No edema.     Left lower leg: No edema.     Comments: Normal rom both knees   Lymphadenopathy:     Cervical: No cervical adenopathy.  Skin:    General: Skin is warm and dry.     Coloration: Skin is not pale.     Findings: No rash.  Neurological:     Mental Status: He is alert.     Motor: No weakness.     Coordination: Coordination normal.     Deep Tendon Reflexes: Reflexes are normal and symmetric. Reflexes normal.  Psychiatric:        Mood and Affect: Mood normal.           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  Can walk unlimited amount (improved after knee repl) , can walk stairs and lift without issues   No cardiac problems noted Uses cpap for osa   No restriction noted for work/ letter done        Morbid obesity (HCC)    Discussed how this problem influences overall health and the risks it imposes  Reviewed plan for weight loss with lower  calorie diet (via better food choices (lower glycemic and portion control) along with exercise building up to or more than 30 minutes 5 days per week including some aerobic activity and strength training   He has osa and prediabetes and HTN as co morbidities   Discussed options for help with weight loss and motivation like NOOM program , weight watchers, or the healthy weight and wellness center through [facility]  If covered by insurance in the future a GLP-1 drug may be helpful   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

## 2023-10-02 NOTE — Assessment & Plan Note (Signed)
Discussed how this problem influences overall health and the risks it imposes  Reviewed plan for weight loss with lower calorie diet (via better food choices (lower glycemic and portion control) along with exercise building up to or more than 30 minutes 5 days per week including some aerobic activity and strength training   He has osa and prediabetes and HTN as co morbidities   Discussed options for help with weight loss and motivation like NOOM program , weight watchers, or the healthy weight and wellness center through [facility]  If covered by insurance in the future a GLP-1 drug may be helpful   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

## 2023-11-11 ENCOUNTER — Ambulatory Visit: Payer: 59 | Admitting: Internal Medicine

## 2023-11-11 ENCOUNTER — Encounter: Payer: Self-pay | Admitting: Internal Medicine

## 2023-11-11 VITALS — BP 130/86 | HR 67 | Temp 97.3°F | Ht 70.0 in | Wt 366.8 lb

## 2023-11-11 DIAGNOSIS — G4733 Obstructive sleep apnea (adult) (pediatric): Secondary | ICD-10-CM

## 2023-11-11 NOTE — Patient Instructions (Addendum)
Excellent job A+  Please continue with CPAP as prescribed Recommend weight loss Continue 10 mg Melatonin as needed as night  Avoid secondhand smoke Avoid SICK contacts Recommend  Masking  when appropriate Recommend Keep up-to-date with vaccinations

## 2023-11-11 NOTE — Progress Notes (Signed)
Name: Charles Morales MRN: [id number] DOB: 12-Mar-1961      CHIEF COMPLAINT:  Follow up assessment of sleep apnea Initial AHI  13   HISTORY OF PRESENT ILLNESS: Patient seen today for assessment of sleep apnea Has been diagnosed with sleep apnea for many years Originally diagnosed in 2018 with OSA  Discussed sleep data and reviewed with patient.  Encouraged proper weight management.  Discussed driving precautions and its relationship with hypersomnolence.  Discussed operating dangerous equipment and its relationship with hypersomnolence.  Discussed sleep hygiene, and benefits of a fixed sleep waked time.  The importance of getting eight or more hours of sleep discussed with patient.  Discussed limiting the use of the computer and television before bedtime.  Decrease naps during the day, so night time sleep will become enhanced.  Limit caffeine, and sleep deprivation.  HTN, stroke, and heart failure are potential risk factors.   Discussed risk of untreated sleep apnea including cardiac arrhthymias, stroke, DM, pulm HTN.    CPAP DL 41/3244 01% compliance for days 93 compliance for greater than 4 hours  AHI reduced to 3.5(initial AHI 13)  CPAP download 11/2023 100% for compliance for days 1% compliance for greater than 4 hours  Auto CPAP 12-18 AHI reduced to 3.9 Patient use and benefits from therapy  No exacerbation at this time No evidence of heart failure at this time No evidence or signs of infection at this time No respiratory distress No fevers, chills, nausea, vomiting, diarrhea No evidence of lower extremity edema No evidence hemoptysis    PAST MEDICAL HISTORY :   has a past medical history of Allergy, Arthritis, Esophageal reflux, Family history of breast cancer, Family history of genetic disease carrier, Family history of pancreatic cancer, Fatty liver, HLD (hyperlipidemia), HTN (hypertension), Morbid obesity (HCC), Pre-diabetes, Sleep apnea, Tobacco use  disorder, and Unspecified sleep apnea.  has a past surgical history that includes Knee arthroscopy; Colonoscopy (2013); Colonoscopy with propofol (N/A, 05/20/2018); and Total knee arthroplasty (Right, 10/12/2022). Prior to Admission medications   Medication Sig Start Date End Date Taking? Authorizing Provider  ALPRAZolam Prudy Feeler) 0.5 MG tablet Take 1 tablet (0.5 mg total) by mouth daily as needed for anxiety. 09/15/20   Tower, Audrie Gallus, MD  amoxicillin (AMOXIL) 500 MG tablet Take 2 by mouth one hour before dental appt, then 2 by mouth six hours after. 10/30/22   Kathryne Hitch, MD  aspirin EC 325 MG tablet Take 1 tablet (325 mg total) by mouth 2 (two) times daily. Patient not taking: Reported on 10/29/2022 10/13/22   Kathryne Hitch, MD  cetirizine (ZYRTEC) 10 MG tablet Take 10 mg by mouth daily.    [provider]  cyanocobalamin (VITAMIN B12) 1000 MCG tablet Take 1,000 mcg by mouth daily.    [provider]  diclofenac Sodium (VOLTAREN) 1 % GEL Apply 1 Application topically 4 (four) times daily as needed (pain).    [provider]  diphenhydrAMINE HCl, Sleep, (ZZZQUIL) 50 MG/30ML LIQD Take 50 mg by mouth at bedtime as needed (sleep).    [provider]  GLUCOSAMINE-CHONDROITIN PO Take 1 tablet by mouth 2 (two) times daily.    [provider]  HYDROmorphone (DILAUDID) 4 MG tablet Take 1 tablet (4 mg total) by mouth every 4 (four) hours as needed for severe pain. 11/02/22   Kathryne Hitch, MD  lisinopril-hydrochlorothiazide (ZESTORETIC) 20-25 MG tablet TAKE 1 TABLET BY MOUTH EVERY DAY 11/30/22   Tower, Audrie Gallus, MD  methocarbamol (ROBAXIN)  500 MG tablet Take 1 tablet (500 mg total) by mouth every 6 (six) hours as needed for muscle spasms. 10/13/22   Kathryne Hitch, MD  NON FORMULARY CPAP at night as directed     [provider]  Omega-3 Fatty Acids (FISH OIL) 1200 MG CPDR Take 2,400 mg by mouth 2 (two) times daily.     [provider]  omeprazole (PRILOSEC) 40 MG capsule TAKE 1 CAPSULE (40 MG TOTAL) BY MOUTH DAILY. 11/30/22   Tower, Audrie Gallus, MD  Oxymetazoline HCl (VICKS SINEX NA) Place 1 spray into the nose daily as needed (allergies).    [provider]  rosuvastatin (CRESTOR) 5 MG tablet TAKE 1 TABLET (5 MG TOTAL) BY MOUTH DAILY. 11/30/22   Tower, Audrie Gallus, MD   No Known Allergies  FAMILY HISTORY:  family history includes Breast cancer (age of onset: 66) in his sister; Heart attack in his mother; Heart attack (age of onset: 11) in his father; Heart disease in his father and mother; Hypertension in his mother; Kidney disease in his maternal grandfather and mother; Lung cancer in his brother. SOCIAL HISTORY:  reports that he quit smoking about 31 years ago. His smoking use included cigarettes. He started smoking about 35 years ago. He has a 4 pack-year smoking history. His smokeless tobacco use includes snuff. He reports current alcohol use. He reports that he does not use drugs.    BP 130/86 (BP Location: Right Wrist, Cuff Size: Normal)   Pulse 67   Temp (!) 97.3 F (36.3 C)   Ht 5\' 10"  (1.778 m)   Wt (!) 366 lb 12.8 oz (166.4 kg)   SpO2 94%   BMI 52.63 kg/m      Review of Systems: Gen:  Denies  fever, sweats, chills weight loss  HEENT: Denies blurred vision, double vision, ear pain, eye pain, hearing loss, nose bleeds, sore throat Cardiac:  No dizziness, chest pain or heaviness, chest tightness,edema, No JVD Resp:   No cough, -sputum production, -shortness of breath,-wheezing, -hemoptysis,  Other:  All other systems negative   Physical Examination:   General Appearance: No distress  EYES PERRLA, EOM intact.   NECK Supple, No JVD Pulmonary: normal breath sounds, No wheezing.  CardiovascularNormal S1,S2.  No m/r/g.   Abdomen: Benign, Soft, non-tender. Neurology UE/LE 5/5 strength, no focal deficits Ext pulses intact, cap refill intact ALL OTHER ROS ARE  NEGATIVE      ASSESSMENT AND PLAN SYNOPSIS 62 year old pleasant white male seen today for follow-up assessment for OSA  Moderate OSA Well-controlled at this time I will continue to prescribe auto CPAP 12 to 18 cm of water pressure Patient with excellent compliance report reviewed with patient in detail He uses and benefits from therapy  Insomnia Continue melatonin as needed 10 mg  Obesity -recommend significant weight loss -recommend changing diet  Deconditioned state -Recommend increased daily activity and exercise    MEDICATION ADJUSTMENTS/LABS AND TESTS ORDERED: Please continue with CPAP as prescribed Continue Melatonin as needed Recommend weight loss Avoid secondhand smoke Avoid SICK contacts Recommend  Masking  when appropriate Recommend Keep up-to-date with vaccinations   CURRENT MEDICATIONS REVIEWED AT LENGTH WITH PATIENT TODAY   Patient  satisfied with Plan of action and management. All questions answered   Follow up 1 year   Total Time Spent  32 mins   Broc Caspers Santiago Glad, M.D.  [facility]  Medical Director [facility] Medical Director [facility] Cardio-Pulmonary Department

## 2023-12-10 ENCOUNTER — Telehealth: Payer: Self-pay | Admitting: Family Medicine

## 2023-12-10 DIAGNOSIS — I10 Essential (primary) hypertension: Secondary | ICD-10-CM

## 2023-12-10 DIAGNOSIS — Z125 Encounter for screening for malignant neoplasm of prostate: Secondary | ICD-10-CM

## 2023-12-10 DIAGNOSIS — R7303 Prediabetes: Secondary | ICD-10-CM

## 2023-12-10 DIAGNOSIS — E7800 Pure hypercholesterolemia, unspecified: Secondary | ICD-10-CM

## 2023-12-10 DIAGNOSIS — Z79899 Other long term (current) drug therapy: Secondary | ICD-10-CM

## 2023-12-10 NOTE — Telephone Encounter (Signed)
-----   Message from Alvina Chou sent at 11/26/2023  3:30 PM EST ----- Regarding: Lab orders for [facility], 12/12/23 Patient is scheduled for CPX labs, please order future labs, Thanks , Camelia Eng

## 2023-12-11 ENCOUNTER — Ambulatory Visit: Payer: 59 | Admitting: Internal Medicine

## 2023-12-12 ENCOUNTER — Other Ambulatory Visit: Payer: 59

## 2023-12-12 DIAGNOSIS — R7303 Prediabetes: Secondary | ICD-10-CM

## 2023-12-12 DIAGNOSIS — Z125 Encounter for screening for malignant neoplasm of prostate: Secondary | ICD-10-CM

## 2023-12-12 DIAGNOSIS — I10 Essential (primary) hypertension: Secondary | ICD-10-CM | POA: Diagnosis not present

## 2023-12-12 DIAGNOSIS — Z79899 Other long term (current) drug therapy: Secondary | ICD-10-CM | POA: Diagnosis not present

## 2023-12-12 DIAGNOSIS — E7800 Pure hypercholesterolemia, unspecified: Secondary | ICD-10-CM | POA: Diagnosis not present

## 2023-12-12 LAB — HEMOGLOBIN A1C: Hgb A1c MFr Bld: 6.1 % (ref 4.6–6.5)

## 2023-12-12 LAB — TSH: TSH: 0.93 u[IU]/mL (ref 0.35–5.50)

## 2023-12-12 LAB — COMPREHENSIVE METABOLIC PANEL
ALT: 23 U/L (ref 0–53)
AST: 19 U/L (ref 0–37)
Albumin: 4.1 g/dL (ref 3.5–5.2)
Alkaline Phosphatase: 53 U/L (ref 39–117)
BUN: 14 mg/dL (ref 6–23)
CO2: 28 meq/L (ref 19–32)
Calcium: 9.3 mg/dL (ref 8.4–10.5)
Chloride: 103 meq/L (ref 96–112)
Creatinine, Ser: 1.02 mg/dL (ref 0.40–1.50)
GFR: 79.43 mL/min (ref >60.00)
Glucose, Bld: 101 mg/dL — ABNORMAL HIGH (ref 70–99)
Potassium: 4.6 meq/L (ref 3.5–5.1)
Sodium: 140 meq/L (ref 135–145)
Total Bilirubin: 0.9 mg/dL (ref 0.2–1.2)
Total Protein: 6.9 g/dL (ref 6.0–8.3)

## 2023-12-12 LAB — CBC WITH DIFFERENTIAL/PLATELET
Basophils Absolute: 0 10*3/uL (ref 0.0–0.1)
Basophils Relative: 0.4 % (ref 0.0–3.0)
Eosinophils Absolute: 0.1 10*3/uL (ref 0.0–0.7)
Eosinophils Relative: 2.6 % (ref 0.0–5.0)
HCT: 47.8 % (ref 39.0–52.0)
Hemoglobin: 15.8 g/dL (ref 13.0–17.0)
Lymphocytes Relative: 21.8 % (ref 12.0–46.0)
Lymphs Abs: 1.2 10*3/uL (ref 0.7–4.0)
MCHC: 33.1 g/dL (ref 30.0–36.0)
MCV: 91.6 fL (ref 78.0–100.0)
Monocytes Absolute: 0.7 10*3/uL (ref 0.1–1.0)
Monocytes Relative: 12.5 % — ABNORMAL HIGH (ref 3.0–12.0)
Neutro Abs: 3.4 10*3/uL (ref 1.4–7.7)
Neutrophils Relative %: 62.7 % (ref 43.0–77.0)
Platelets: 169 10*3/uL (ref 150.0–400.0)
RBC: 5.21 Mil/uL (ref 4.22–5.81)
RDW: 14.6 % (ref 11.5–15.5)
WBC: 5.4 10*3/uL (ref 4.0–10.5)

## 2023-12-12 LAB — VITAMIN D 25 HYDROXY (VIT D DEFICIENCY, FRACTURES): VITD: 21.01 ng/mL — ABNORMAL LOW (ref 30.00–100.00)

## 2023-12-12 LAB — LIPID PANEL
Cholesterol: 97 mg/dL (ref 0–200)
HDL: 30.5 mg/dL — ABNORMAL LOW (ref >39.00)
LDL Cholesterol: 49 mg/dL (ref 0–99)
NonHDL: 66.84
Total CHOL/HDL Ratio: 3
Triglycerides: 90 mg/dL (ref 0.0–149.0)
VLDL: 18 mg/dL (ref 0.0–40.0)

## 2023-12-12 LAB — VITAMIN B12: Vitamin B-12: 1226 pg/mL — ABNORMAL HIGH (ref 211–911)

## 2023-12-12 LAB — PSA: PSA: 1.81 ng/mL (ref 0.10–4.00)

## 2023-12-20 ENCOUNTER — Encounter: Payer: Self-pay | Admitting: Family Medicine

## 2023-12-20 ENCOUNTER — Ambulatory Visit (INDEPENDENT_AMBULATORY_CARE_PROVIDER_SITE_OTHER): Payer: 59 | Admitting: Family Medicine

## 2023-12-20 VITALS — BP 130/84 | HR 73 | Temp 97.6°F | Ht 69.5 in | Wt 367.0 lb

## 2023-12-20 DIAGNOSIS — K219 Gastro-esophageal reflux disease without esophagitis: Secondary | ICD-10-CM

## 2023-12-20 DIAGNOSIS — E7800 Pure hypercholesterolemia, unspecified: Secondary | ICD-10-CM | POA: Diagnosis not present

## 2023-12-20 DIAGNOSIS — I10 Essential (primary) hypertension: Secondary | ICD-10-CM

## 2023-12-20 DIAGNOSIS — Z0000 Encounter for general adult medical examination without abnormal findings: Secondary | ICD-10-CM | POA: Diagnosis not present

## 2023-12-20 DIAGNOSIS — Z125 Encounter for screening for malignant neoplasm of prostate: Secondary | ICD-10-CM

## 2023-12-20 DIAGNOSIS — G4733 Obstructive sleep apnea (adult) (pediatric): Secondary | ICD-10-CM

## 2023-12-20 DIAGNOSIS — Z79899 Other long term (current) drug therapy: Secondary | ICD-10-CM

## 2023-12-20 DIAGNOSIS — R7303 Prediabetes: Secondary | ICD-10-CM

## 2023-12-20 DIAGNOSIS — F430 Acute stress reaction: Secondary | ICD-10-CM | POA: Insufficient documentation

## 2023-12-20 DIAGNOSIS — F17200 Nicotine dependence, unspecified, uncomplicated: Secondary | ICD-10-CM

## 2023-12-20 DIAGNOSIS — Z1211 Encounter for screening for malignant neoplasm of colon: Secondary | ICD-10-CM

## 2023-12-20 DIAGNOSIS — E559 Vitamin D deficiency, unspecified: Secondary | ICD-10-CM

## 2023-12-20 MED ORDER — LISINOPRIL-HYDROCHLOROTHIAZIDE 20-25 MG PO TABS: 1.0000 | ORAL_TABLET | Freq: Every day | ORAL | 3 refills | Status: AC

## 2023-12-20 MED ORDER — ROSUVASTATIN CALCIUM 5 MG PO TABS: 5.0000 mg | ORAL_TABLET | Freq: Every day | ORAL | 3 refills | Status: AC

## 2023-12-20 MED ORDER — OMEPRAZOLE 40 MG PO CPDR: 40.0000 mg | DELAYED_RELEASE_CAPSULE | Freq: Every day | ORAL | 3 refills | Status: AC

## 2023-12-20 MED ORDER — FLUOXETINE HCL 10 MG PO CAPS: 10.0000 mg | ORAL_CAPSULE | Freq: Every day | ORAL | 1 refills | Status: DC

## 2023-12-20 NOTE — Assessment & Plan Note (Signed)
Reviewed stressors/ coping techniques/symptoms/ support sources/ tx options and side effects in detail today  Some anx/dep symptoms and also emotional eating Offered counseling ref  Prescription fluoxetine 10 mg daily  Discussed expectations of SSRI medication including time to effectiveness and mechanism of action, also poss of side effects (early and late)- including mental fuzziness, weight or appetite change, nausea and poss of worse dep or anxiety (even suicidal thoughts)  Pt voiced understanding and will stop med and update if this occurs   Follow up 4-6 wk

## 2023-12-20 NOTE — Assessment & Plan Note (Signed)
Not ready to quit  Goes to dentist regularly

## 2023-12-20 NOTE — Assessment & Plan Note (Signed)
Lab Results  Component Value Date   VITAMINB12 1,226 (H) 12/12/2023   Will cut b12 in half Last vitamin D Lab Results  Component Value Date   VD25OH 21.01 (L) 12/12/2023   Will start D3 2000 international units daily

## 2023-12-20 NOTE — Progress Notes (Unsigned)
Subjective:    Patient ID: Charles Morales, male    DOB: 09-08-1961, 62 y.o.   MRN: [id number]  HPI  Here for health maintenance exam and to review chronic medical problems   Wt Readings from Last 3 Encounters:  12/20/23 (!) 367 lb (166.5 kg)  11/11/23 (!) 366 lb 12.8 oz (166.4 kg)  10/02/23 (!) 349 lb 4 oz (158.4 kg)   53.42 kg/m  Vitals:   12/20/23 0824  Pulse: 73  Temp: 97.6 F (36.4 C)  SpO2: 97%    Immunization History  Administered Date(s) Administered   Influenza Whole 11/11/2009   Influenza, Seasonal, Injecte, Preservative Fre 10/02/2023   Influenza,inj,Quad PF,6+ Mos 10/14/2015, 11/12/2016, 11/13/2019, 11/17/2020, 11/20/2021, 12/13/2022   Moderna Sars-Covid-2 Vaccination 02/03/2020, 03/02/2020, 12/20/2020   Td 12/31/2000, 12/13/2022   Tdap 05/23/2012   Zoster Recombinant(Shingrix) 12/04/2018, 02/19/2019    There are no preventive care reminders to display for this patient.  Hep c/ hiv - declined low ris    Prostate health Lab Results  Component Value Date   PSA 1.81 12/12/2023   PSA 2.33 12/13/2022   PSA 1.12 11/20/2021  No urinary changes   Colon cancer screening  colonoscopy 04/2018 with 10 y recall    Bone health   Falls-none  Fractures-none  Supplements -none  Last vitamin D Lab Results  Component Value Date   VD25OH 21.01 (L) 12/12/2023    Exercise  Physical job    Derm care No recent visit   Snuff use - still uses  Goes to the dentist regularly    Mood    12/20/2023    8:25 AM 10/02/2023    8:15 AM 12/13/2022   10:21 AM 11/20/2021    3:26 PM 11/17/2020    9:10 AM  Depression screen PHQ 2/9  Decreased Interest 1 0 0 0 0  Down, Depressed, Hopeless 0 0 0 1 1  PHQ - 2 Score 1 0 0 1 1  Altered sleeping  0 1 1 0  Tired, decreased energy  0 0 1 1  Change in appetite  1  1 0  Feeling bad or failure about yourself   0 0 0 1  Trouble concentrating  0 0 0 0  Moving slowly or fidgety/restless  0 0 0 0  Suicidal thoughts  0  0 0 0  PHQ-9 Score  1 1 4 3   Difficult doing work/chores  Not difficult at all  Not difficult at all Not difficult at all   Lot of stress  Emotional eater  Family members with cancer Wife laid off from job   Xanax prn   Interested in prozac    HTN bp is stable today  No cp or palpitations or headaches or edema  No side effects to medicines  BP Readings from Last 3 Encounters:  12/20/23 130/84  11/11/23 130/86  10/02/23 136/78    Lisinopril hct 20-25 mg daily   Lab Results  Component Value Date   NA 140 12/12/2023   K 4.6 12/12/2023   CO2 28 12/12/2023   GLUCOSE 101 (H) 12/12/2023   BUN 14 12/12/2023   CREATININE 1.02 12/12/2023   CALCIUM 9.3 12/12/2023   GFR 79.43 12/12/2023   GFRNONAA >60 10/13/2022   OSA- using cpap  Sees pulmonary   GERD Omeprazole 40 mg daily  Lab Results  Component Value Date   VITAMINB12 1,226 (H) 12/12/2023   Hyperlipidemia with CAD Lab Results  Component Value Date   CHOL 97 12/12/2023  CHOL 112 12/13/2022   CHOL 120 11/20/2021   Lab Results  Component Value Date   HDL 30.50 (L) 12/12/2023   HDL 36.30 (L) 12/13/2022   HDL 38.30 (L) 11/20/2021   Lab Results  Component Value Date   LDLCALC 49 12/12/2023   LDLCALC 61 12/13/2022   LDLCALC 65 11/20/2021   Lab Results  Component Value Date   TRIG 90.0 12/12/2023   TRIG 73.0 12/13/2022   TRIG 88.0 11/20/2021   Lab Results  Component Value Date   CHOLHDL 3 12/12/2023   CHOLHDL 3 12/13/2022   CHOLHDL 3 11/20/2021   No results found for: "LDLDIRECT" Crestor 5 mg daily   Prediabetes Lab Results  Component Value Date   HGBA1C 6.1 12/12/2023   6.0 last time      Patient Active Problem List   Diagnosis Date Noted   Encounter for physical examination related to employment 10/02/2023   Status post total right knee replacement 10/12/2022   Current use of proton pump inhibitor 11/20/2021   Genetic testing 09/15/2018   Family history of genetic disease carrier  09/02/2018   Family history of breast cancer    Family history of pancreatic cancer    Prostate cancer screening 08/17/2018   History of colonic polyps    Prediabetes 04/01/2016   Left ankle pain 10/14/2015   PVC's (premature ventricular contractions) 10/22/2013   Colon cancer screening 05/23/2012   Routine general medical examination at a health care facility 05/20/2012   Right knee pain 06/08/2011   G E R D 08/04/2007   Hyperlipidemia 07/22/2007   Morbid obesity (HCC) 07/22/2007   SMOKELESS TOBACCO ABUSE 07/22/2007   High blood pressure 07/22/2007   OSA (obstructive sleep apnea) 07/22/2007   Past Medical History:  Diagnosis Date   Allergy    Arthritis    right knee   Esophageal reflux    Family history of breast cancer    Family history of genetic disease carrier    sister NBN pathogenic variant   Family history of pancreatic cancer    Fatty liver    HLD (hyperlipidemia)    HTN (hypertension)    Morbid obesity (HCC)    Pre-diabetes    Sleep apnea    USES CPAP    Tobacco use disorder    Unspecified sleep apnea    Past Surgical History:  Procedure Laterality Date   COLONOSCOPY  2013   COLONOSCOPY WITH PROPOFOL N/A 05/20/2018   Procedure: COLONOSCOPY WITH PROPOFOL;  Surgeon: Beverley Fiedler, MD;  Location: [facility];  Service: Gastroenterology;  Laterality: N/A;   KNEE ARTHROSCOPY     left   TOTAL KNEE ARTHROPLASTY Right 10/12/2022   Procedure: RIGHT TOTAL KNEE ARTHROPLASTY;  Surgeon: Kathryne Hitch, MD;  Location: [facility];  Service: Orthopedics;  Laterality: Right;   Social History   Tobacco Use   Smoking status: Former    Current packs/day: 0.00    Average packs/day: 1 pack/day for 4.0 years (4.0 ttl pk-yrs)    Types: Cigarettes    Start date: 44    Quit date: 1993    Years since quitting: 31.9   Smokeless tobacco: Current    Types: Snuff  Vaping Use   Vaping status: Never Used  Substance Use Topics   Alcohol use: Yes    Comment: rare   Drug  use: No   Family History  Problem Relation Age of Onset   Hypertension Mother    Heart attack Mother  deceased   Heart disease Mother    Kidney disease Mother    Heart attack Father 48       deceased   Heart disease Father    Breast cancer Sister 42       NBN +   Lung cancer Brother    Kidney disease Maternal Grandfather    Colon cancer Neg Hx    Stomach cancer Neg Hx    Esophageal cancer Neg Hx    Rectal cancer Neg Hx    No Known Allergies Current Outpatient Medications on File Prior to Visit  Medication Sig Dispense Refill   ALPRAZolam (XANAX) 0.5 MG tablet Take 1 tablet (0.5 mg total) by mouth daily as needed for anxiety. 30 tablet 0   cetirizine (ZYRTEC) 10 MG tablet Take 10 mg by mouth daily.     cyanocobalamin (VITAMIN B12) 1000 MCG tablet Take 1,000 mcg by mouth daily.     diclofenac Sodium (VOLTAREN) 1 % GEL Apply 1 Application topically 4 (four) times daily as needed (pain).     diphenhydrAMINE HCl, Sleep, (ZZZQUIL) 50 MG/30ML LIQD Take 50 mg by mouth at bedtime as needed (sleep).     GLUCOSAMINE-CHONDROITIN PO Take 1 tablet by mouth 2 (two) times daily.     lisinopril-hydrochlorothiazide (ZESTORETIC) 20-25 MG tablet Take 1 tablet by mouth daily. 90 tablet 3   Melatonin 10 MG TABS Take 1 tablet by mouth daily.     methocarbamol (ROBAXIN) 500 MG tablet Take 1 tablet (500 mg total) by mouth every 6 (six) hours as needed for muscle spasms. 60 tablet 1   NON FORMULARY CPAP at night as directed      Omega-3 Fatty Acids (FISH OIL) 1200 MG CPDR Take 2,400 mg by mouth 2 (two) times daily.     omeprazole (PRILOSEC) 40 MG capsule Take 1 capsule (40 mg total) by mouth daily. 90 capsule 3   Oxymetazoline HCl (VICKS SINEX NA) Place 1 spray into the nose daily as needed (allergies).     rosuvastatin (CRESTOR) 5 MG tablet Take 1 tablet (5 mg total) by mouth daily. 90 tablet 3   No current facility-administered medications on file prior to visit.    Review of Systems      Objective:   Physical Exam        Assessment & Plan:   Problem List Items Addressed This Visit       Cardiovascular and Mediastinum   High blood pressure     Respiratory   OSA (obstructive sleep apnea)     Digestive   G E R D     Other   Routine general medical examination at a health care facility - Primary   Prediabetes   Hyperlipidemia   Current use of proton pump inhibitor

## 2023-12-20 NOTE — Assessment & Plan Note (Signed)
bp in fair control at this time (despite weight gain) BP Readings from Last 1 Encounters:  12/20/23 130/84   No changes needed Most recent labs reviewed  Disc lifstyle change with low sodium diet and exercise  Plan to continue lisinopril hct 20-25 mg daily

## 2023-12-20 NOTE — Assessment & Plan Note (Signed)
  Lab Results  Component Value Date   HGBA1C 6.1 12/12/2023    May be up due to weight gain   disc imp of low glycemic diet and wt loss to prevent DM2

## 2023-12-20 NOTE — Patient Instructions (Addendum)
Get some vitamin D3 and take 2000 international units daily over the counter   Cut your B12 supplement by half    Stay active Add some strength training to your routine, this is important for bone and brain health and can reduce your risk of falls and help your body use insulin properly and regulate weight  Light weights, exercise bands , and internet videos are a good way to start  Yoga (chair or regular), machines , floor exercises or a gym with machines are also good options     Check in with insurance periodically regarding coverage of GLP-1 medicines  like wegovy and zepbound   Let us know when/if you want to start some mental health counseling   I will send in 10 mg generic prozac  If any intolerable side effects or if you feel worse - stop it and call us   Follow up in 4-6 weeks for a visit

## 2023-12-20 NOTE — Assessment & Plan Note (Signed)
Continues cpap  Weight loss would help /pt is aware

## 2023-12-20 NOTE — Assessment & Plan Note (Signed)
Colonoscopy 2019 with 10 y recall

## 2023-12-20 NOTE — Assessment & Plan Note (Signed)
Lab Results  Component Value Date   PSA 1.81 12/12/2023   PSA 2.33 12/13/2022   PSA 1.12 11/20/2021    No voiding changes No fam history

## 2023-12-20 NOTE — Assessment & Plan Note (Signed)
Continues omeprazole 40 mg  Does ok if missing a day Encouraged to try taking every other day Weight loss would hlep

## 2023-12-20 NOTE — Assessment & Plan Note (Signed)
Disc goals for lipids and reasons to control them Rev last labs with pt Rev low sat fat diet in detail  Takes crestor 5 mg daily  LDL at 49 HDL baseline low-encouraged exercise  Labs ordered

## 2023-12-21 DIAGNOSIS — E559 Vitamin D deficiency, unspecified: Secondary | ICD-10-CM | POA: Insufficient documentation

## 2023-12-21 NOTE — Assessment & Plan Note (Signed)
Bmi of 53.4  Pt admits to emotional eating- counseling referral offered  Discussed how this problem influences overall health and the risks it imposes  Reviewed plan for weight loss with lower calorie diet (via better food choices (lower glycemic and portion control) along with exercise building up to or more than 30 minutes 5 days per week including some aerobic activity and strength training    Pt would be a good candidate for GLP-1 med  Has co morbidities including OSA, HTN, GERD, joint pain and prediabetes He will continue to check into his insurance coverage/plan to see if they may pay for this in the future

## 2023-12-21 NOTE — Assessment & Plan Note (Signed)
Reviewed health habits including diet and exercise and skin cancer prevention Reviewed appropriate screening tests for age  Also reviewed health mt list, fam hx and immunization status , as well as social and family history   See HPI Labs reviewed and ordered Declined HIV screen due to low risk  Psa is stable /decreased from a year ago  Colonoscopy utd 2019  Discussed fall prevention, supplements and exercise for bone density  Encouraged to stop tobacco use  Dental visits utd  PHQ is elevated due to stress reaction-see a/p for this treatment plan  Health Maintenance  Topic Date Due   Hepatitis C Screening  02/02/2024*   HIV Screening  02/02/2024*   COVID-19 Vaccine (4 - 2024-25 season) 10/17/2024*   Colon Cancer Screening  05/20/2028   DTaP/Tdap/Td vaccine (4 - Td or Tdap) 12/13/2032   Flu Shot  Completed   Zoster (Shingles) Vaccine  Completed   HPV Vaccine  Aged Out  *Topic was postponed. The date shown is not the original due date.

## 2023-12-21 NOTE — Assessment & Plan Note (Signed)
Last vitamin D Lab Results  Component Value Date   VD25OH 21.01 (L) 12/12/2023   Encouraged to start 2000 international units D3 daily over the counter  Discussed importance to bone and overall health

## 2024-01-13 ENCOUNTER — Encounter: Payer: Self-pay | Admitting: Family Medicine

## 2024-01-13 ENCOUNTER — Ambulatory Visit (INDEPENDENT_AMBULATORY_CARE_PROVIDER_SITE_OTHER): Payer: 59 | Admitting: Family Medicine

## 2024-01-13 VITALS — BP 118/76 | HR 64 | Temp 98.4°F | Ht 69.5 in | Wt 367.1 lb

## 2024-01-13 DIAGNOSIS — F430 Acute stress reaction: Secondary | ICD-10-CM | POA: Diagnosis not present

## 2024-01-13 DIAGNOSIS — I10 Essential (primary) hypertension: Secondary | ICD-10-CM

## 2024-01-13 MED ORDER — FLUOXETINE HCL 20 MG PO CAPS: 20.0000 mg | ORAL_CAPSULE | Freq: Every day | ORAL | 3 refills | Status: DC

## 2024-01-13 NOTE — Patient Instructions (Addendum)
 Go up to 20 mg on the fluoxetine   You can double up 10s and then pick up the new prescription   Take care of yourself   Pick some small goals weekly for lifestyle change   Pick a better snack if you can  Consider some exercise starting in 5-10 minute intervals    Follow up in 2-3 months

## 2024-01-13 NOTE — Assessment & Plan Note (Signed)
 bp in fair control at this time (despite weight gain) BP Readings from Last 1 Encounters:  01/13/24 118/76   No changes needed Most recent labs reviewed  Disc lifstyle change with low sodium diet and exercise  Plan to continue lisinopril  hct 20-25 mg daily

## 2024-01-13 NOTE — Progress Notes (Signed)
 Subjective:    Patient ID: Charles Morales, male    DOB: May 01, 1961, 63 y.o.   MRN: [id number]  HPI  Wt Readings from Last 3 Encounters:  01/13/24 (!) 367 lb 2 oz (166.5 kg)  12/20/23 (!) 367 lb (166.5 kg)  11/11/23 (!) 366 lb 12.8 oz (166.4 kg)   53.44 kg/m  Vitals:   01/13/24 0817  BP: 118/76  Pulse: 64  Temp: 98.4 F (36.9 C)  SpO2: 94%   Pt presents for follow up of mood    Last visit 12/20 discussed stress reaction with some anx/ dep symptoms and emotional eating   Prescription fluoxetine  10 mg daily  Offered counseling referral   Tolerates the medication well  No side effects   Family notes some improvement  Less irritable  Less frustrated and upset   Sleep is fair / melatonin helps him get to sleep Appetite is good   Trying to watch what he eats -doing this for himself  Trying to get a little more active   Plans to start some weight lifting   Taking care of himself  Had to work over holidays   HTN bp is stable today  No cp or palpitations or headaches or edema  No side effects to medicines  BP Readings from Last 3 Encounters:  01/13/24 118/76  12/20/23 130/84  11/11/23 130/86    Fine with lisinopril  hct 20-25 mg daily      Patient Active Problem List   Diagnosis Date Noted   Vitamin D  deficiency 12/21/2023   Stress reaction 12/20/2023   Encounter for physical examination related to employment 10/02/2023   Status post total right knee replacement 10/12/2022   Current use of proton pump inhibitor 11/20/2021   Genetic testing 09/15/2018   Family history of genetic disease carrier 09/02/2018   Family history of breast cancer    Family history of pancreatic cancer    Prostate cancer screening 08/17/2018   History of colonic polyps    Prediabetes 04/01/2016   Left ankle pain 10/14/2015   PVC's (premature ventricular contractions) 10/22/2013   Colon cancer screening 05/23/2012   Routine general medical examination at a health care  facility 05/20/2012   Right knee pain 06/08/2011   G E R D 08/04/2007   Hyperlipidemia 07/22/2007   Morbid obesity (HCC) 07/22/2007   SMOKELESS TOBACCO ABUSE 07/22/2007   Hypertension, essential 07/22/2007   OSA (obstructive sleep apnea) 07/22/2007   Past Medical History:  Diagnosis Date   Allergy    Arthritis    right knee   Esophageal reflux    Family history of breast cancer    Family history of genetic disease carrier    sister NBN pathogenic variant   Family history of pancreatic cancer    Fatty liver    HLD (hyperlipidemia)    HTN (hypertension)    Morbid obesity (HCC)    Pre-diabetes    Sleep apnea    USES CPAP    Tobacco use disorder    Unspecified sleep apnea    Past Surgical History:  Procedure Laterality Date   COLONOSCOPY  2013   COLONOSCOPY WITH PROPOFOL  N/A 05/20/2018   Procedure: COLONOSCOPY WITH PROPOFOL ;  Surgeon: Albertus Gordy HERO, MD;  Location: [facility];  Service: Gastroenterology;  Laterality: N/A;   KNEE ARTHROSCOPY     left   TOTAL KNEE ARTHROPLASTY Right 10/12/2022   Procedure: RIGHT TOTAL KNEE ARTHROPLASTY;  Surgeon: Vernetta Lonni GRADE, MD;  Location: [facility];  Service: Orthopedics;  Laterality: Right;   Social History   Tobacco Use   Smoking status: Former    Current packs/day: 0.00    Average packs/day: 1 pack/day for 4.0 years (4.0 ttl pk-yrs)    Types: Cigarettes    Start date: 31    Quit date: 33    Years since quitting: 32.0   Smokeless tobacco: Current    Types: Snuff  Vaping Use   Vaping status: Never Used  Substance Use Topics   Alcohol use: Yes    Comment: rare   Drug use: No   Family History  Problem Relation Age of Onset   Hypertension Mother    Heart attack Mother        deceased   Heart disease Mother    Kidney disease Mother    Heart attack Father 8       deceased   Heart disease Father    Breast cancer Sister 52       NBN +   Lung cancer Brother    Kidney disease Maternal Grandfather    Colon  cancer Neg Hx    Stomach cancer Neg Hx    Esophageal cancer Neg Hx    Rectal cancer Neg Hx    No Known Allergies Current Outpatient Medications on File Prior to Visit  Medication Sig Dispense Refill   ALPRAZolam  (XANAX ) 0.5 MG tablet Take 1 tablet (0.5 mg total) by mouth daily as needed for anxiety. 30 tablet 0   cetirizine (ZYRTEC) 10 MG tablet Take 10 mg by mouth daily.     cholecalciferol (VITAMIN D3) 25 MCG (1000 UNIT) tablet Take 2,000 Units by mouth daily.     cyanocobalamin  (VITAMIN B12) 1000 MCG tablet Take 500 mcg by mouth daily.     diclofenac Sodium (VOLTAREN) 1 % GEL Apply 1 Application topically 4 (four) times daily as needed (pain).     diphenhydrAMINE  HCl, Sleep, (ZZZQUIL) 50 MG/30ML LIQD Take 50 mg by mouth at bedtime as needed (sleep).     GLUCOSAMINE-CHONDROITIN PO Take 1 tablet by mouth 2 (two) times daily.     lisinopril -hydrochlorothiazide  (ZESTORETIC ) 20-25 MG tablet Take 1 tablet by mouth daily. 90 tablet 3   Melatonin 10 MG TABS Take 1 tablet by mouth daily.     methocarbamol  (ROBAXIN ) 500 MG tablet Take 1 tablet (500 mg total) by mouth every 6 (six) hours as needed for muscle spasms. 60 tablet 1   NON FORMULARY CPAP at night as directed      Omega-3 Fatty Acids (FISH OIL) 1200 MG CPDR Take 2,400 mg by mouth 2 (two) times daily.     omeprazole  (PRILOSEC) 40 MG capsule Take 1 capsule (40 mg total) by mouth daily. 90 capsule 3   Oxymetazoline HCl (VICKS SINEX NA) Place 1 spray into the nose daily as needed (allergies).     rosuvastatin  (CRESTOR ) 5 MG tablet Take 1 tablet (5 mg total) by mouth daily. 90 tablet 3   No current facility-administered medications on file prior to visit.    Review of Systems  Constitutional:  Negative for activity change, appetite change, fatigue, fever and unexpected weight change.  HENT:  Negative for congestion, rhinorrhea, sore throat and trouble swallowing.   Eyes:  Negative for pain, redness, itching and visual disturbance.   Respiratory:  Negative for cough, chest tightness, shortness of breath and wheezing.   Cardiovascular:  Negative for chest pain and palpitations.  Gastrointestinal:  Negative for abdominal pain, blood in stool, constipation, diarrhea and nausea.  Endocrine: Negative for cold intolerance, heat intolerance, polydipsia and polyuria.  Genitourinary:  Negative for difficulty urinating, dysuria, frequency and urgency.  Musculoskeletal:  Negative for arthralgias, joint swelling and myalgias.  Skin:  Negative for pallor and rash.  Neurological:  Negative for dizziness, tremors, weakness, numbness and headaches.  Hematological:  Negative for adenopathy. Does not bruise/bleed easily.  Psychiatric/Behavioral:  Positive for dysphoric mood and sleep disturbance. Negative for decreased concentration. The patient is nervous/anxious.        Objective:   Physical Exam Constitutional:      General: He is not in acute distress.    Appearance: Normal appearance. He is obese. He is not ill-appearing.  Cardiovascular:     Rate and Rhythm: Normal rate and regular rhythm.  Pulmonary:     Effort: Pulmonary effort is normal. No respiratory distress.  Neurological:     Mental Status: He is alert.  Psychiatric:        Attention and Perception: Attention normal.        Mood and Affect: Mood is not anxious.        Speech: Speech normal.        Behavior: Behavior normal.     Comments: Calm today  Candidly discusses symptoms and stressors              Assessment & Plan:   Problem List Items Addressed This Visit       Cardiovascular and Mediastinum   Hypertension, essential   bp in fair control at this time (despite weight gain) BP Readings from Last 1 Encounters:  01/13/24 118/76   No changes needed Most recent labs reviewed  Disc lifstyle change with low sodium diet and exercise  Plan to continue lisinopril  hct 20-25 mg daily         Other   Stress reaction - Primary   With anxious/  depressed mood and emotional eating  Overall slightly improved since starting fluoxetine  10 mg which he tolerates well Reviewed stressors/ coping techniques/symptoms/ support sources/ tx options and side effects in detail today Family agrees some improvement in frustration and irritability   Will increase to 20 mg daily  Discussed expectations of SSRI medication including time to effectiveness and mechanism of action, also poss of side effects (early and late)- including mental fuzziness, weight or appetite change, nausea and poss of worse dep or anxiety (even suicidal thoughts)  Pt voiced understanding and will stop med and update if this occurs   Offered counseling referral if needed   Encouraged self care including exercise / small steps Follow up 2-3 mo       Relevant Medications   FLUoxetine  (PROZAC ) 20 MG capsule

## 2024-01-13 NOTE — Assessment & Plan Note (Signed)
 With anxious/ depressed mood and emotional eating  Overall slightly improved since starting fluoxetine  10 mg which he tolerates well Reviewed stressors/ coping techniques/symptoms/ support sources/ tx options and side effects in detail today Family agrees some improvement in frustration and irritability   Will increase to 20 mg daily  Discussed expectations of SSRI medication including time to effectiveness and mechanism of action, also poss of side effects (early and late)- including mental fuzziness, weight or appetite change, nausea and poss of worse dep or anxiety (even suicidal thoughts)  Pt voiced understanding and will stop med and update if this occurs   Offered counseling referral if needed   Encouraged self care including exercise / small steps Follow up 2-3 mo

## 2024-01-17 ENCOUNTER — Encounter: Payer: Self-pay | Admitting: Family Medicine

## 2024-01-19 MED ORDER — TIRZEPATIDE-WEIGHT MANAGEMENT 2.5 MG/0.5ML ~~LOC~~ SOLN: 2.5000 mg | SUBCUTANEOUS | 0 refills | Status: DC

## 2024-01-19 MED ORDER — TIRZEPATIDE-WEIGHT MANAGEMENT 2.5 MG/0.5ML ~~LOC~~ SOLN
2.5000 mg | SUBCUTANEOUS | 0 refills | Status: DC
Start: 2024-01-19 — End: 2024-01-19

## 2024-01-19 NOTE — Telephone Encounter (Signed)
This med won't go through electronically (zepbound generic) Would someone please try again or call in on Monday?  Thanks

## 2024-01-19 NOTE — Addendum Note (Signed)
Addended by: Roxy Manns A on: 01/19/2024 01:46 PM   Modules accepted: Orders

## 2024-01-20 MED ORDER — TIRZEPATIDE-WEIGHT MANAGEMENT 2.5 MG/0.5ML ~~LOC~~ SOLN: 2.5000 mg | SUBCUTANEOUS | 0 refills | Status: DC

## 2024-01-20 NOTE — Addendum Note (Signed)
Addended by: Shon Millet on: 01/20/2024 08:31 AM   Modules accepted: Orders

## 2024-01-22 ENCOUNTER — Telehealth: Payer: Self-pay

## 2024-01-22 NOTE — Telephone Encounter (Signed)
Copied from CRM [id number]. Topic: Clinical - Medication Question >> Jan 22, 2024 11:57 AM Almira Coaster wrote: Reason for CRM: Patient is calling because the pharmacy has not received the approval for his tirzepatide (ZEPBOUND) 2.5 MG/0.5ML injection vial. Patient is asking if we can resend it.

## 2024-01-22 NOTE — Telephone Encounter (Signed)
Called pharmacy and they received the Rx for zepbound but the med does require a PA.   Will route to PA dpt and also PCP so she is aware that med does need PA since mychart message from pt said it was an approved med.

## 2024-01-24 ENCOUNTER — Other Ambulatory Visit (HOSPITAL_COMMUNITY): Payer: Self-pay

## 2024-01-24 ENCOUNTER — Telehealth: Payer: Self-pay

## 2024-01-24 NOTE — Telephone Encounter (Signed)
Pharmacy Patient Advocate Encounter   Received notification from Pt Calls Messages that prior authorization for Zepbound 2.5MG /0.5ML pen-injectors is required/requested.   Insurance verification completed.   The patient is insured through [facility] .   Per test claim: PA required; PA submitted to above mentioned insurance via CoverMyMeds Key/confirmation #/EOC [id number] Status is pending

## 2024-01-27 ENCOUNTER — Other Ambulatory Visit (HOSPITAL_COMMUNITY): Payer: Self-pay

## 2024-01-27 ENCOUNTER — Telehealth: Payer: Self-pay

## 2024-01-27 MED ORDER — TIRZEPATIDE-WEIGHT MANAGEMENT 2.5 MG/0.5ML ~~LOC~~ SOAJ: 2.5000 mg | SUBCUTANEOUS | 0 refills | Status: DC

## 2024-01-27 NOTE — Telephone Encounter (Signed)
Copied and pasted CRM:  Sent to wrong practice [facility]) I was not sure how to forward CRM.   Reason for CRM: Patient called in due to needing the actual pen for zepbound due to pharmacy not caring the vial / please call [phone] Javarie Crisp  (wife).   Thank you, Annabelle Harman

## 2024-01-27 NOTE — Telephone Encounter (Signed)
Copied from CRM [id number]. Topic: Clinical - Prescription Issue >> Jan 27, 2024  7:52 AM Deaijah H wrote: Reason for CRM: Patient called in due to needing the actual pen for zepbound due to pharmacy not caring the vial / please call [phone] Izael Bessinger  (wife).

## 2024-01-27 NOTE — Telephone Encounter (Signed)
Pharmacy Patient Advocate Encounter  Received notification from [facility] that Prior Authorization for Zepbound 2.5MG /0.5ML pen-injectors has been APPROVED from 01/24/24 to 07/23/24. Unable to obtain price due to refill too soon rejection, last fill date 01/27/24 next available fill date 02/17/24   PA #/Case ID/Reference #:  [id number]

## 2024-01-27 NOTE — Telephone Encounter (Signed)
Called and discussed with patients wife this morning, see other encounter. Rx for pen was sent in.

## 2024-01-27 NOTE — Telephone Encounter (Signed)
Called patients wife and reviewed all information. She verbalized understanding. Will call if any further questions.

## 2024-01-27 NOTE — Telephone Encounter (Signed)
I sent it

## 2024-01-30 ENCOUNTER — Other Ambulatory Visit (HOSPITAL_COMMUNITY): Payer: Self-pay

## 2024-01-31 ENCOUNTER — Other Ambulatory Visit (HOSPITAL_COMMUNITY): Payer: Self-pay

## 2024-02-19 ENCOUNTER — Emergency Department (HOSPITAL_BASED_OUTPATIENT_CLINIC_OR_DEPARTMENT_OTHER)
Admission: EM | Admit: 2024-02-19 | Discharge: 2024-02-19 | Disposition: A | Discharge status: Home / Self Care | Payer: 59 | Attending: Emergency Medicine | Admitting: Emergency Medicine

## 2024-02-19 ENCOUNTER — Encounter (HOSPITAL_BASED_OUTPATIENT_CLINIC_OR_DEPARTMENT_OTHER): Payer: Self-pay

## 2024-02-19 DIAGNOSIS — Z79899 Other long term (current) drug therapy: Secondary | ICD-10-CM | POA: Diagnosis not present

## 2024-02-19 DIAGNOSIS — B9789 Other viral agents as the cause of diseases classified elsewhere: Secondary | ICD-10-CM | POA: Insufficient documentation

## 2024-02-19 DIAGNOSIS — J069 Acute upper respiratory infection, unspecified: Secondary | ICD-10-CM | POA: Insufficient documentation

## 2024-02-19 DIAGNOSIS — R059 Cough, unspecified: Secondary | ICD-10-CM | POA: Diagnosis present

## 2024-02-19 LAB — RESP PANEL BY RT-PCR (RSV, FLU A&B, COVID)  RVPGX2
Influenza A by PCR: NEGATIVE
Influenza B by PCR: NEGATIVE
Resp Syncytial Virus by PCR: NEGATIVE
SARS Coronavirus 2 by RT PCR: NEGATIVE

## 2024-02-19 NOTE — Discharge Instructions (Signed)
Follow-up with your PCP as needed.  Your respiratory panel was negative today for COVID, flu, RSV.  We discussed x-ray but she deferred today.  Return for any concerning symptoms.

## 2024-02-19 NOTE — ED Provider Notes (Signed)
Charles Morales   CSN: [id number] Arrival date & time: 02/19/24  1753     History  Chief Complaint  Patient presents with   Cough    Charles Morales is a 63 y.o. male.  63 year old male presents with his wife for concern of URI symptoms that started yesterday.  Endorses some sinus congestion, pharyngitis, and productive cough.  Denies fever.  Endorses body aches.  States his family has been sick with RSV.  Denies shortness of breath or chest pain.  The history is provided by the patient. No language interpreter was used.       Home Medications Prior to Admission medications   Medication Sig Start Date End Date Taking? Authorizing Provider  ALPRAZolam Prudy Feeler) 0.5 MG tablet Take 1 tablet (0.5 mg total) by mouth daily as needed for anxiety. 09/15/20   Tower, Audrie Gallus, MD  cetirizine (ZYRTEC) 10 MG tablet Take 10 mg by mouth daily.    [provider]  cholecalciferol (VITAMIN D3) 25 MCG (1000 UNIT) tablet Take 2,000 Units by mouth daily.    [provider]  cyanocobalamin (VITAMIN B12) 1000 MCG tablet Take 500 mcg by mouth daily.    [provider]  diclofenac Sodium (VOLTAREN) 1 % GEL Apply 1 Application topically 4 (four) times daily as needed (pain).    [provider]  diphenhydrAMINE HCl, Sleep, (ZZZQUIL) 50 MG/30ML LIQD Take 50 mg by mouth at bedtime as needed (sleep).    [provider]  FLUoxetine (PROZAC) 20 MG capsule Take 1 capsule (20 mg total) by mouth daily. 01/13/24   Tower, Audrie Gallus, MD  GLUCOSAMINE-CHONDROITIN PO Take 1 tablet by mouth 2 (two) times daily.    [provider]  lisinopril-hydrochlorothiazide (ZESTORETIC) 20-25 MG tablet Take 1 tablet by mouth daily. 12/20/23   Tower, Audrie Gallus, MD  Melatonin 10 MG TABS Take 1 tablet by mouth daily.    [provider]  methocarbamol (ROBAXIN) 500 MG tablet Take 1 tablet (500 mg total) by mouth every 6 (six)  hours as needed for muscle spasms. 10/13/22   Kathryne Hitch, MD  NON FORMULARY CPAP at night as directed     [provider]  Omega-3 Fatty Acids (FISH OIL) 1200 MG CPDR Take 2,400 mg by mouth 2 (two) times daily.    [provider]  omeprazole (PRILOSEC) 40 MG capsule Take 1 capsule (40 mg total) by mouth daily. 12/20/23   Tower, Audrie Gallus, MD  Oxymetazoline HCl (VICKS SINEX NA) Place 1 spray into the nose daily as needed (allergies).    [provider]  rosuvastatin (CRESTOR) 5 MG tablet Take 1 tablet (5 mg total) by mouth daily. 12/20/23   Tower, Audrie Gallus, MD  tirzepatide [facility]) 2.5 MG/0.5ML Pen Inject 2.5 mg into the skin once a week. 01/27/24   Tower, Audrie Gallus, MD      Allergies    Patient has no known allergies.    Review of Systems   Review of Systems  Constitutional:  Positive for chills and fever.  HENT:  Positive for congestion. Negative for sore throat (Now resolved).   Respiratory:  Positive for cough. Negative for shortness of breath.   Cardiovascular:  Negative for chest pain.  Gastrointestinal:  Negative for abdominal pain.  All other systems reviewed and are negative.   Physical Exam Updated Vital Signs BP 128/72 (BP Location: Right Arm)   Pulse 72   Temp 97.7 F (36.5 C) (  Oral)   Resp 18   Ht 5\' 10"  (1.778 m)   Wt (!) 164.2 kg   SpO2 97%   BMI 51.94 kg/m  Physical Exam Vitals and nursing Morales reviewed.  Constitutional:      General: He is not in acute distress.    Appearance: Normal appearance. He is not ill-appearing.  HENT:     Head: Normocephalic and atraumatic.     Nose: Nose normal.  Eyes:     Conjunctiva/sclera: Conjunctivae normal.  Cardiovascular:     Rate and Rhythm: Normal rate and regular rhythm.  Pulmonary:     Effort: Pulmonary effort is normal. No respiratory distress.  Musculoskeletal:        General: No deformity. Normal range of motion.     Cervical back: Normal range of motion.  Skin:     Findings: No rash.  Neurological:     Mental Status: He is alert.     ED Results / Procedures / Treatments   Labs (all labs ordered are listed, but only abnormal results are displayed) Labs Reviewed  RESP PANEL BY RT-PCR (RSV, FLU A&B, COVID)  [id number]    EKG None  Radiology No results found.  Procedures Procedures    Medications Ordered in ED Medications - No data to display  ED Course/ Medical Decision Making/ A&P                                 Medical Decision Making  63 year old male presents today for concern of URI symptoms that started yesterday.  He is predominantly here for work Morales.  States his work requires him to be evaluated if he is not can be able to make it into his shift.  Respiratory panel is negative.  I offered chest x-ray but he defers at this time.  Strict return precautions given.  Patient voices understanding and is in agreement with plan.  Final Clinical Impression(s) / ED Diagnoses Final diagnoses:  Viral URI with cough    Rx / DC Orders ED Discharge Orders     None         Marita Kansas, PA-C 02/19/24 1934    Melene Plan, DO 02/19/24 2127

## 2024-02-19 NOTE — ED Triage Notes (Addendum)
Body aches and pais..  Congestion Cough productive brownish.  States had low grade fever  Took aleve about 1 hour ago  States he needs a work note

## 2024-02-24 ENCOUNTER — Encounter: Payer: Self-pay | Admitting: Family Medicine

## 2024-02-24 ENCOUNTER — Other Ambulatory Visit: Payer: Self-pay | Admitting: Family Medicine

## 2024-02-24 MED ORDER — BENZONATATE 200 MG PO CAPS: 200.0000 mg | ORAL_CAPSULE | Freq: Three times a day (TID) | ORAL | 1 refills | Status: DC | PRN

## 2024-02-24 NOTE — Telephone Encounter (Signed)
 Last Fill: 01/27/24 2 mL/0 refills  Last OV: 01/13/24 Next OV: None Scheduled  Routing to provider for review/authorization.

## 2024-02-24 NOTE — Telephone Encounter (Signed)
 Copied from CRM [id number]. Topic: Clinical - Medication Refill >> Feb 24, 2024 11:08 AM Myrtice Lauth wrote: Most Recent Primary Care Visit:  Provider: Roxy Manns A  Department: [facility]  Visit Type: OFFICE VISIT  Date: 01/13/2024  Medication: tirzepatide (ZEPBOUND) 2.5 MG/0.5ML Pen  Has the patient contacted their pharmacy? Yes (Agent: If no, request that the patient contact the pharmacy for the refill. If patient does not wish to contact the pharmacy document the reason why and proceed with request.) (Agent: If yes, when and what did the pharmacy advise?)  Is this the correct pharmacy for this prescription? Yes If no, delete pharmacy and type the correct one.  This is the patient's preferred pharmacy:  [id number] [place], [place] - [address] [place] [address] Phone: [phone] Fax: [phone]   Has the prescription been filled recently? Yes  Is the patient out of the medication? Yes  Has the patient been seen for an appointment in the last year OR does the patient have an upcoming appointment? Yes  Can we respond through MyChart? Yes  Agent: Please be advised that Rx refills may take up to 3 business days. We ask that you follow-up with your pharmacy.

## 2024-02-24 NOTE — Telephone Encounter (Signed)
 Do you want to go up on the dose? How are you tolerating the medicine ?

## 2024-02-25 MED ORDER — TIRZEPATIDE-WEIGHT MANAGEMENT 5 MG/0.5ML ~~LOC~~ SOLN: 5.0000 mg | SUBCUTANEOUS | 0 refills | Status: DC

## 2024-02-25 NOTE — Addendum Note (Signed)
 Addended by: Roxy Manns A on: 02/25/2024 08:46 PM   Modules accepted: Orders

## 2024-02-26 ENCOUNTER — Other Ambulatory Visit: Payer: Self-pay | Admitting: Family Medicine

## 2024-02-27 ENCOUNTER — Emergency Department (HOSPITAL_BASED_OUTPATIENT_CLINIC_OR_DEPARTMENT_OTHER): Payer: No Typology Code available for payment source

## 2024-02-27 ENCOUNTER — Other Ambulatory Visit (HOSPITAL_BASED_OUTPATIENT_CLINIC_OR_DEPARTMENT_OTHER): Payer: Self-pay

## 2024-02-27 ENCOUNTER — Encounter (HOSPITAL_BASED_OUTPATIENT_CLINIC_OR_DEPARTMENT_OTHER): Payer: Self-pay | Admitting: Emergency Medicine

## 2024-02-27 ENCOUNTER — Emergency Department (HOSPITAL_BASED_OUTPATIENT_CLINIC_OR_DEPARTMENT_OTHER)
Admission: EM | Admit: 2024-02-27 | Discharge: 2024-02-27 | Disposition: A | Discharge status: Home / Self Care | Payer: No Typology Code available for payment source | Attending: Emergency Medicine | Admitting: Emergency Medicine

## 2024-02-27 ENCOUNTER — Other Ambulatory Visit: Payer: Self-pay

## 2024-02-27 DIAGNOSIS — Z79899 Other long term (current) drug therapy: Secondary | ICD-10-CM | POA: Diagnosis not present

## 2024-02-27 DIAGNOSIS — N12 Tubulo-interstitial nephritis, not specified as acute or chronic: Secondary | ICD-10-CM | POA: Insufficient documentation

## 2024-02-27 DIAGNOSIS — C649 Malignant neoplasm of unspecified kidney, except renal pelvis: Secondary | ICD-10-CM | POA: Diagnosis not present

## 2024-02-27 DIAGNOSIS — R339 Retention of urine, unspecified: Secondary | ICD-10-CM | POA: Diagnosis present

## 2024-02-27 DIAGNOSIS — D49519 Neoplasm of unspecified behavior of unspecified kidney: Secondary | ICD-10-CM

## 2024-02-27 LAB — CBC WITH DIFFERENTIAL/PLATELET
Abs Immature Granulocytes: 0.05 10*3/uL (ref 0.00–0.07)
Basophils Absolute: 0 10*3/uL (ref 0.0–0.1)
Basophils Relative: 0 %
Eosinophils Absolute: 0.1 10*3/uL (ref 0.0–0.5)
Eosinophils Relative: 1 %
HCT: 47.2 % (ref 39.0–52.0)
Hemoglobin: 16.2 g/dL (ref 13.0–17.0)
Immature Granulocytes: 0 %
Lymphocytes Relative: 8 %
Lymphs Abs: 1 10*3/uL (ref 0.7–4.0)
MCH: 29.8 pg (ref 26.0–34.0)
MCHC: 34.3 g/dL (ref 30.0–36.0)
MCV: 86.8 fL (ref 80.0–100.0)
Monocytes Absolute: 0.9 10*3/uL (ref 0.1–1.0)
Monocytes Relative: 8 %
Neutro Abs: 10.3 10*3/uL — ABNORMAL HIGH (ref 1.7–7.7)
Neutrophils Relative %: 83 %
Platelets: 196 10*3/uL (ref 150–400)
RBC: 5.44 MIL/uL (ref 4.22–5.81)
RDW: 14.2 % (ref 11.5–15.5)
WBC: 12.3 10*3/uL — ABNORMAL HIGH (ref 4.0–10.5)
nRBC: 0 % (ref 0.0–0.2)

## 2024-02-27 LAB — URINALYSIS, ROUTINE W REFLEX MICROSCOPIC
Bilirubin Urine: NEGATIVE
Glucose, UA: NEGATIVE mg/dL
Nitrite: NEGATIVE
Protein, ur: 30 mg/dL — AB
Specific Gravity, Urine: 1.027 (ref 1.005–1.030)
WBC, UA: >50 WBC/hpf (ref 0–5)
pH: 5.5 (ref 5.0–8.0)

## 2024-02-27 LAB — BASIC METABOLIC PANEL
Anion gap: 11 (ref 5–15)
BUN: 16 mg/dL (ref 8–23)
CO2: 25 mmol/L (ref 22–32)
Calcium: 10.1 mg/dL (ref 8.9–10.3)
Chloride: 101 mmol/L (ref 98–111)
Creatinine, Ser: 1.03 mg/dL (ref 0.61–1.24)
GFR, Estimated: >60 mL/min (ref >60)
Glucose, Bld: 112 mg/dL — ABNORMAL HIGH (ref 70–99)
Potassium: 3.7 mmol/L (ref 3.5–5.1)
Sodium: 137 mmol/L (ref 135–145)

## 2024-02-27 MED ORDER — ZEPBOUND 5 MG/0.5ML ~~LOC~~ SOAJ: 5.0000 mg | SUBCUTANEOUS | 0 refills | Status: DC

## 2024-02-27 MED ORDER — PHENAZOPYRIDINE HCL 200 MG PO TABS
200.0000 mg | ORAL_TABLET | Freq: Three times a day (TID) | ORAL | 0 refills | Status: DC
  Filled 2024-02-27 (×2): qty 6, 2d supply, fill #0

## 2024-02-27 MED ORDER — HYDROMORPHONE HCL 1 MG/ML IJ SOLN
1.0000 mg | Freq: Once | INTRAMUSCULAR | Status: AC
  Administered 2024-02-27: 1 mg via INTRAVENOUS
  Filled 2024-02-27: qty 1

## 2024-02-27 MED ORDER — CEFPODOXIME PROXETIL 200 MG PO TABS
200.0000 mg | ORAL_TABLET | Freq: Two times a day (BID) | ORAL | 0 refills | Status: DC
  Filled 2024-02-27: qty 28, 14d supply, fill #0

## 2024-02-27 MED ORDER — CEFPODOXIME PROXETIL 200 MG PO TABS
200.0000 mg | ORAL_TABLET | Freq: Two times a day (BID) | ORAL | 0 refills | Status: DC
Start: 2024-02-27 — End: 2024-02-27

## 2024-02-27 MED ORDER — BENZONATATE 200 MG PO CAPS
200.0000 mg | ORAL_CAPSULE | Freq: Three times a day (TID) | ORAL | 0 refills | Status: DC | PRN
  Filled 2024-02-27: qty 20, 7d supply, fill #0

## 2024-02-27 MED ORDER — SODIUM CHLORIDE 0.9 % IV SOLN
2.0000 g | Freq: Once | INTRAVENOUS | Status: AC
  Administered 2024-02-27: 2 g via INTRAVENOUS
  Filled 2024-02-27: qty 20

## 2024-02-27 MED ORDER — IBUPROFEN 600 MG PO TABS: 600.0000 mg | ORAL_TABLET | Freq: Four times a day (QID) | ORAL | 0 refills | Status: DC | PRN

## 2024-02-27 MED ORDER — LACTATED RINGERS IV BOLUS
1000.0000 mL | Freq: Once | INTRAVENOUS | Status: AC
  Administered 2024-02-27: 1000 mL via INTRAVENOUS

## 2024-02-27 MED ORDER — IBUPROFEN 600 MG PO TABS
600.0000 mg | ORAL_TABLET | Freq: Four times a day (QID) | ORAL | 0 refills | Status: DC | PRN
  Filled 2024-02-27: qty 30, 8d supply, fill #0

## 2024-02-27 MED ORDER — PHENAZOPYRIDINE HCL 100 MG PO TABS
200.0000 mg | ORAL_TABLET | Freq: Once | ORAL | Status: AC
  Administered 2024-02-27: 200 mg via ORAL
  Filled 2024-02-27: qty 2

## 2024-02-27 MED ORDER — KETOROLAC TROMETHAMINE 30 MG/ML IJ SOLN
30.0000 mg | Freq: Once | INTRAMUSCULAR | Status: AC
  Administered 2024-02-27: 30 mg via INTRAVENOUS
  Filled 2024-02-27: qty 1

## 2024-02-27 MED ORDER — SODIUM CHLORIDE 0.9 % IV BOLUS
1000.0000 mL | Freq: Once | INTRAVENOUS | Status: AC
  Administered 2024-02-27: 1000 mL via INTRAVENOUS

## 2024-02-27 MED ORDER — PHENAZOPYRIDINE HCL 200 MG PO TABS: 200.0000 mg | ORAL_TABLET | Freq: Three times a day (TID) | ORAL | 0 refills | Status: DC

## 2024-02-27 NOTE — Discharge Instructions (Addendum)
 1.  You were given an IV dose of Rocephin in the emergency department for bladder and kidney infection.  Fill your prescription and start your antibiotic, Vantin tomorrow morning.  You may take Pyridium as prescribed for bladder irritation.  This will turn your urine bright orange.  For back or flank pain take ibuprofen 600 mg every 8 hours with some food. 2.  Return to emergency department immediately if you develop a fever, worsening pain, confusion, vomiting or any signs of worsening condition. 3.  Per your request Jerilynn Som were filled along with your prescription from your previous prescription from your physician.  See instructions for taking these. 4.  Your CT scan showed a small nodule in the kidney that must have further evaluation to rule out a cancer or tumor.  Follow-up with your doctor for referrals and further evaluation.

## 2024-02-27 NOTE — Addendum Note (Signed)
 Addended by: Roxy Manns A on: 02/27/2024 02:53 PM   Modules accepted: Orders

## 2024-02-27 NOTE — ED Provider Notes (Signed)
 [place] EMERGENCY DEPARTMENT AT [facility] Provider Note   CSN: [id number] Arrival date & time: 02/27/24  8657     History  Chief Complaint  Patient presents with   Urinary Retention    Charles Morales is a 63 y.o. male.  HPI Patient was at work last night.  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  He reports he felt fine for the first half of his shift.  He was urinating going to the bathroom per usual.  After about 2 AM he started getting urinary urgency and frequency.  He reports that he had intense burning with urination and every time he dribbled a few amounts of urine, he felt like he had to go more again.  Patient denies any prior history of urinary retention.  No history of frequent urinary tract infections.  He reports this morning now he is having some chills.  He has not had fever that he is aware of.  No nausea no vomiting.    Home Medications Prior to Admission medications   Medication Sig Start Date End Date Taking? Authorizing Provider  benzonatate (TESSALON) 200 MG capsule Take 1 capsule (200 mg total) by mouth 3 (three) times daily as needed for cough. Swallow whole, do not chew or suck the capsule. 02/27/24  Yes Arby Barrette, MD  FLUoxetine (PROZAC) 10 MG capsule Take 10 mg by mouth daily. 01/17/24  Yes [provider]  ALPRAZolam Prudy Feeler) 0.5 MG tablet Take 1 tablet (0.5 mg total) by mouth daily as needed for anxiety. 09/15/20   Tower, Audrie Gallus, MD  cefpodoxime (VANTIN) 200 MG tablet Take 1 tablet (200 mg total) by mouth 2 (two) times daily. 02/27/24   Arby Barrette, MD  cetirizine (ZYRTEC) 10 MG tablet Take 10 mg by mouth daily.    [provider]  cholecalciferol (VITAMIN D3) 25 MCG (1000 UNIT) tablet Take 2,000 Units by mouth daily.    [provider]  cyanocobalamin (VITAMIN B12) 1000 MCG tablet Take 500 mcg by mouth daily.    [provider]  diclofenac Sodium (VOLTAREN) 1 % GEL Apply 1 Application  topically 4 (four) times daily as needed (pain).    [provider]  diphenhydrAMINE HCl, Sleep, (ZZZQUIL) 50 MG/30ML LIQD Take 50 mg by mouth at bedtime as needed (sleep).    [provider]  FLUoxetine (PROZAC) 20 MG capsule Take 1 capsule (20 mg total) by mouth daily. 01/13/24   Tower, Audrie Gallus, MD  GLUCOSAMINE-CHONDROITIN PO Take 1 tablet by mouth 2 (two) times daily.    [provider]  ibuprofen (ADVIL) 600 MG tablet Take 1 tablet (600 mg total) by mouth every 6 (six) hours as needed. 02/27/24   Arby Barrette, MD  lisinopril-hydrochlorothiazide (ZESTORETIC) 20-25 MG tablet Take 1 tablet by mouth daily. 12/20/23   Tower, Audrie Gallus, MD  Melatonin 10 MG TABS Take 1 tablet by mouth daily.    [provider]  methocarbamol (ROBAXIN) 500 MG tablet Take 1 tablet (500 mg total) by mouth every 6 (six) hours as needed for muscle spasms. 10/13/22   Kathryne Hitch, MD  NON FORMULARY CPAP at night as directed     [provider]  Omega-3 Fatty Acids (FISH OIL) 1200 MG CPDR Take 2,400 mg by mouth 2 (two) times daily.    [provider]  omeprazole (PRILOSEC) 40 MG capsule Take 1 capsule (40 mg total) by mouth daily. 12/20/23   Tower, Audrie Gallus, MD  Oxymetazoline HCl (VICKS SINEX NA) Place 1 spray into the nose daily as needed (allergies).    [provider]  phenazopyridine (PYRIDIUM) 200 MG tablet Take 1 tablet (200 mg total) by mouth 3 (three) times daily. 02/27/24   Arby Barrette, MD  rosuvastatin (CRESTOR) 5 MG tablet Take 1 tablet (5 mg total) by mouth daily. 12/20/23   Tower, Audrie Gallus, MD  tirzepatide 5 MG/0.5ML injection vial Inject 5 mg into the skin once a week. 02/25/24   Tower, Audrie Gallus, MD      Allergies    Patient has no known allergies.    Review of Systems   Review of Systems  Physical Exam Updated Vital Signs BP 101/63   Pulse 71   Temp 98.1 F (36.7 C) (Oral)   Resp 16   SpO2 96%  Physical Exam Constitutional:       Comments: Alert.  Uncomfortable in appearance.  Mental status clear.  HENT:     Mouth/Throat:     Pharynx: Oropharynx is clear.  Eyes:     Extraocular Movements: Extraocular movements intact.  Cardiovascular:     Rate and Rhythm: Normal rate and regular rhythm.  Pulmonary:     Effort: Pulmonary effort is normal.     Breath sounds: Normal breath sounds.  Abdominal:     Comments: Patient has significant abdominal obesity with pannus folds.  There is moderate candidal rash in the suprapubic pannus fold.  Patient does have some supra pubic tenderness but no palpable mass.  Positive for CVA tenderness.  Upper abdomen nontender.  Genitourinary:    Comments: Patient has significant abdominal obesity.  Penis is involuted.  No scrotal edema. Musculoskeletal:        General: Normal range of motion.     Right lower leg: No edema.     Left lower leg: No edema.  Skin:    General: Skin is warm and dry.  Neurological:     General: No focal deficit present.     Mental Status: He is oriented to person, place, and time.     Motor: No weakness.     Coordination: Coordination normal.  Psychiatric:        Mood and Affect: Mood normal.     ED Results / Procedures / Treatments   Labs (all labs ordered are listed, but only abnormal results are displayed) Labs Reviewed  URINALYSIS, ROUTINE W REFLEX MICROSCOPIC - Abnormal; Notable for the following components:      Result Value   APPearance HAZY (*)    Hgb urine dipstick MODERATE (*)    Ketones, ur TRACE (*)    Protein, ur 30 (*)    Leukocytes,Ua LARGE (*)    Bacteria, UA RARE (*)    All other components within normal limits  BASIC METABOLIC PANEL - Abnormal; Notable for the following components:   Glucose, Bld 112 (*)    All other components within normal limits  CBC WITH DIFFERENTIAL/PLATELET - Abnormal; Notable for the following components:   WBC 12.3 (*)    Neutro Abs 10.3 (*)    All other components within normal limits  URINE  CULTURE    EKG None  Radiology CT Renal Stone Study Result Date: 02/27/2024 CLINICAL DATA:  Abdominal and flank pain.  Body aches. EXAM: CT ABDOMEN AND PELVIS WITHOUT CONTRAST TECHNIQUE: Multidetector CT imaging of the abdomen and pelvis was performed following the standard protocol without IV contrast. RADIATION DOSE REDUCTION: This exam was performed according to the departmental  dose-optimization program which includes automated exposure control, adjustment of the mA and/or kV according to patient size and/or use of iterative reconstruction technique. COMPARISON:  None Available. FINDINGS: Lower chest: Normal heart size. Patchy nodular areas of consolidation within the left lower lung (image 11; series 4), concerning for pneumonia in the appropriate clinical setting. No pleural effusion. Hepatobiliary: The liver is normal in size and contour. The liver is diffusely low in attenuation compatible with steatosis. Cholelithiasis. No gallbladder wall thickening or pericholecystic fluid. No biliary ductal dilatation. Pancreas: Unremarkable Spleen: Small splenule adjacent to the pancreatic tail. Adrenals/Urinary Tract: The adrenal glands are normal. Symmetric renal size. Mild pelvic fullness bilaterally. No nephrolithiasis. No ureterolithiasis. Urinary bladder is decompressed. Multiple bilateral renal cysts are demonstrated. On the inferior pole of the right kidney there is an exophytic 1.6 cm dense renal mass within internal density of 53 Hounsfield units (image 63; series 5). Stomach/Bowel: Descending colonic diverticulosis. No CT evidence for acute diverticulitis. The appendix is normal. Normal morphology of the stomach. Small descending duodenal diverticulum. No evidence for small bowel obstruction. No free fluid or free intraperitoneal air. Vascular/Lymphatic: Normal caliber abdominal aorta. Peripheral calcified atherosclerotic plaque. No retroperitoneal lymphadenopathy. Reproductive: Unremarkable. Other:  Small fat containing left inguinal hernia. Musculoskeletal: Lower lumbar spine and lower thoracic spine degenerative changes. No aggressive or acute appearing osseous lesions. IMPRESSION: 1. Patchy nodular areas of consolidation within the left lower lung, concerning for pneumonia in the appropriate clinical setting. 2. No nephrolithiasis or ureterolithiasis. 3. Hepatic steatosis. 4. Cholelithiasis. 5. Descending colonic diverticulosis. No CT evidence for acute diverticulitis. 6. There is an exophytic 1.6 cm dense renal mass on the inferior pole of the right kidney. This is indeterminate. Recommend further evaluation with renal ultrasound. Electronically Signed   By: Dr. Annia Belt M.D.   On: 02/27/2024 09:44    Procedures Procedures    Medications Ordered in ED Medications  sodium chloride 0.9 % bolus 1,000 mL (0 mLs Intravenous Stopped 02/27/24 0951)  HYDROmorphone (DILAUDID) injection 1 mg (1 mg Intravenous Given 02/27/24 0838)  cefTRIAXone (ROCEPHIN) 2 g in sodium chloride 0.9 % 100 mL IVPB (0 g Intravenous Stopped 02/27/24 1030)  phenazopyridine (PYRIDIUM) tablet 200 mg (200 mg Oral Given 02/27/24 0946)  ketorolac (TORADOL) 30 MG/ML injection 30 mg (30 mg Intravenous Given 02/27/24 1022)  lactated ringers bolus 1,000 mL (0 mLs Intravenous Stopped 02/27/24 1126)    ED Course/ Medical Decision Making/ A&P                                 Medical Decision Making Amount and/or Complexity of Data Reviewed Labs: ordered. Radiology: ordered.  Risk Prescription drug management.   Patient presents as outlined.  He has no prior history of urinary retention or urinary tract infections.  Symptoms started quite abruptly.  Urine at bedside is very cloudy.  At this time with associated flank pain concern for pyelonephritis.  Will proceed with CT abdomen pelvis to rule out stone and further delineate presence of pyelonephritis.  Will need additional lab work.  Patient has significant amount of pain.   Will treat with Dilaudid 1 mg IV.   Urinalysis is grossly positive with large leuk esterase and greater than 50 WBCs.  White count 12.3.  H&H normal.  GFR normal greater than 60.  Basic metabolic panel otherwise normal.  I have personally reviewed CT stone study does not show any retained stone.  No bladder distention.  Radiology makes note of a 1.2 cm area in the kidney which will require further evaluation for possible tumor.  Patient and his wife are made aware of this finding and the high importance of follow-up evaluation.  They voiced understanding.  Notably, patient does not have any urinary retention.  My personal review of CT suggest some subtle perinephric stranding and in conjunction with some positive CVA tenderness I feel the patient has pyelonephritis.  At this time it appears uncomplicated.  He has stable vital signs, otherwise stable lab work and is nontoxic in appearance.  We discussed a trial of outpatient management and patient and his wife are in agreement with this plan.  Patient is nontoxic.  History is Rocephin 2 g IV for pyelonephritis.  I have provided rehydration and pain control.  Upon recheck patient is well in appearance.  They request a prescription for Tessalon Perles which had previously been prescribed by the primary doctor but not available at the pharmacy.  Will fill this in conjunction with Vantin, ibuprofen and Pyridium.       Final Clinical Impression(s) / ED Diagnoses Final diagnoses:  Pyelonephritis  Kidney tumor    Rx / DC Orders ED Discharge Orders          Ordered    cefpodoxime (VANTIN) 200 MG tablet  2 times daily,   Status:  Discontinued        02/27/24 0959    phenazopyridine (PYRIDIUM) 200 MG tablet  3 times daily,   Status:  Discontinued        02/27/24 0959    ibuprofen (ADVIL) 600 MG tablet  Every 6 hours PRN,   Status:  Discontinued        02/27/24 0959    cefpodoxime (VANTIN) 200 MG tablet  2 times daily        02/27/24 1200     ibuprofen (ADVIL) 600 MG tablet  Every 6 hours PRN        02/27/24 1200    phenazopyridine (PYRIDIUM) 200 MG tablet  3 times daily        02/27/24 1200    benzonatate (TESSALON) 200 MG capsule  3 times daily PRN        02/27/24 1200              Arby Barrette, MD 02/27/24 1208

## 2024-02-29 LAB — URINE CULTURE: Culture: 70000 — AB

## 2024-03-01 ENCOUNTER — Telehealth (HOSPITAL_BASED_OUTPATIENT_CLINIC_OR_DEPARTMENT_OTHER): Payer: Self-pay | Admitting: *Deleted

## 2024-03-01 NOTE — Telephone Encounter (Signed)
 Post ED Visit - Positive Culture Follow-up  Culture report reviewed by antimicrobial stewardship pharmacist: [facility] []  Enzo Bi, Pharm.D. []  Celedonio Miyamoto, Pharm.D., BCPS AQ-ID []  Garvin Fila, Pharm.D., BCPS []  Georgina Pillion, Pharm.D., BCPS []  [place], [address].D., BCPS, AAHIVP []  Estella Husk, Pharm.D., BCPS, AAHIVP []  Lysle Pearl, PharmD, BCPS []  Phillips Climes, PharmD, BCPS []  Agapito Games, PharmD, BCPS []  Verlan Friends, PharmD []  Mervyn Gay, PharmD, BCPS [x]  Delmar Landau, PharmD  [facility] []  Len Childs, PharmD []  Greer Pickerel, PharmD []  Adalberto Cole, PharmD []  Perlie Gold, Rph []  Lonell Face) Jean Rosenthal, PharmD []  Earl Many, PharmD []  Junita Push, PharmD []  Dorna Leitz, PharmD []  Terrilee Files, PharmD []  Lynann Beaver, PharmD []  Keturah Barre, PharmD []  Loralee Pacas, PharmD []  Bernadene Person, PharmD   Positive urine culture Treated with Cefpodoxime Proxetil, organism sensitive to the same and no further patient follow-up is required at this time.  Patsey Berthold 03/01/2024, 11:41 AM

## 2024-03-02 ENCOUNTER — Ambulatory Visit (INDEPENDENT_AMBULATORY_CARE_PROVIDER_SITE_OTHER): Payer: No Typology Code available for payment source | Admitting: Family Medicine

## 2024-03-02 ENCOUNTER — Encounter: Payer: Self-pay | Admitting: Family Medicine

## 2024-03-02 ENCOUNTER — Encounter: Payer: Self-pay | Admitting: *Deleted

## 2024-03-02 VITALS — BP 136/88 | HR 58 | Temp 97.2°F | Ht 70.0 in | Wt 357.4 lb

## 2024-03-02 DIAGNOSIS — N12 Tubulo-interstitial nephritis, not specified as acute or chronic: Secondary | ICD-10-CM | POA: Diagnosis not present

## 2024-03-02 DIAGNOSIS — N2889 Other specified disorders of kidney and ureter: Secondary | ICD-10-CM | POA: Diagnosis not present

## 2024-03-02 DIAGNOSIS — R3915 Urgency of urination: Secondary | ICD-10-CM | POA: Diagnosis not present

## 2024-03-02 LAB — POC URINALSYSI DIPSTICK (AUTOMATED)
Bilirubin, UA: NEGATIVE
Blood, UA: NEGATIVE
Glucose, UA: NEGATIVE
Ketones, UA: NEGATIVE
Nitrite, UA: NEGATIVE
Protein, UA: NEGATIVE
Spec Grav, UA: 1.015 (ref 1.010–1.025)
Urobilinogen, UA: 0.2 U/dL
pH, UA: 6.5 (ref 5.0–8.0)

## 2024-03-02 NOTE — Progress Notes (Signed)
 Subjective:    Patient ID: Charles Morales, male    DOB: Feb 01, 1961, 63 y.o.   MRN: [id number]  HPI  Wt Readings from Last 3 Encounters:  03/02/24 (!) 357 lb 6 oz (162.1 kg)  02/19/24 (!) 362 lb (164.2 kg)  01/13/24 (!) 367 lb 2 oz (166.5 kg)   51.28 kg/m  Vitals:   03/02/24 1054  BP: 136/88  Pulse: (!) 58  Temp: (!) 97.2 F (36.2 C)  SpO2: 96%    Pt presents for follow up of ER visit from 2/27 for urinary retention , pyelonephritis and kidney tumor  In setting of cough with uri / seen previously on 2/19    He presented with urgency/frequency and dyruia  Positive urinalysis  Ucx grew e coli pan sensitive      Imaging CT Renal Stone Study Result Date: 02/27/2024 CLINICAL DATA:  Abdominal and flank pain.  Body aches. EXAM: CT ABDOMEN AND PELVIS WITHOUT CONTRAST TECHNIQUE: Multidetector CT imaging of the abdomen and pelvis was performed following the standard protocol without IV contrast. RADIATION DOSE REDUCTION: This exam was performed according to the departmental dose-optimization program which includes automated exposure control, adjustment of the mA and/or kV according to patient size and/or use of iterative reconstruction technique. COMPARISON:  None Available. FINDINGS: Lower chest: Normal heart size. Patchy nodular areas of consolidation within the left lower lung (image 11; series 4), concerning for pneumonia in the appropriate clinical setting. No pleural effusion. Hepatobiliary: The liver is normal in size and contour. The liver is diffusely low in attenuation compatible with steatosis. Cholelithiasis. No gallbladder wall thickening or pericholecystic fluid. No biliary ductal dilatation. Pancreas: Unremarkable Spleen: Small splenule adjacent to the pancreatic tail. Adrenals/Urinary Tract: The adrenal glands are normal. Symmetric renal size. Mild pelvic fullness bilaterally. No nephrolithiasis. No ureterolithiasis. Urinary bladder is decompressed. Multiple bilateral  renal cysts are demonstrated. On the inferior pole of the right kidney there is an exophytic 1.6 cm dense renal mass within internal density of 53 Hounsfield units (image 63; series 5). Stomach/Bowel: Descending colonic diverticulosis. No CT evidence for acute diverticulitis. The appendix is normal. Normal morphology of the stomach. Small descending duodenal diverticulum. No evidence for small bowel obstruction. No free fluid or free intraperitoneal air. Vascular/Lymphatic: Normal caliber abdominal aorta. Peripheral calcified atherosclerotic plaque. No retroperitoneal lymphadenopathy. Reproductive: Unremarkable. Other: Small fat containing left inguinal hernia. Musculoskeletal: Lower lumbar spine and lower thoracic spine degenerative changes. No aggressive or acute appearing osseous lesions. IMPRESSION: 1. Patchy nodular areas of consolidation within the left lower lung, concerning for pneumonia in the appropriate clinical setting. 2. No nephrolithiasis or ureterolithiasis. 3. Hepatic steatosis. 4. Cholelithiasis. 5. Descending colonic diverticulosis. No CT evidence for acute diverticulitis. 6. There is an exophytic 1.6 cm dense renal mass on the inferior pole of the right kidney. This is indeterminate. Recommend further evaluation with renal ultrasound. Electronically Signed   By: Dr. Annia Belt M.D.   On: 02/27/2024 09:44   Subtle perinephric stranding and CVa tendneress noted  Given rocephin 1 g IV  Cefpodixime 200 mg bid  Pyridium  Ibuprofen  Tessalon for cough   Now Burning sensation is gone  Urgency is still there  No blood  No flank pain right now   Cough is better but not gone - gradually getting better  Worse when lying down  He did test neg for flu /covid and RSV     Drinking a lot of water     Lab Results  Component Value  Date   NA 137 02/27/2024   K 3.7 02/27/2024   CO2 25 02/27/2024   GLUCOSE 112 (H) 02/27/2024   BUN 16 02/27/2024   CREATININE 1.03 02/27/2024   CALCIUM  10.1 02/27/2024   GFR 79.43 12/12/2023   GFRNONAA >60 02/27/2024   Lab Results  Component Value Date   WBC 12.3 (H) 02/27/2024   HGB 16.2 02/27/2024   HCT 47.2 02/27/2024   MCV 86.8 02/27/2024   PLT 196 02/27/2024   Today  Results for orders placed or performed in visit on 03/02/24  POCT Urinalysis Dipstick (Automated)   Collection Time: 03/02/24 11:51 AM  Result Value Ref Range   Color, UA Yellow    Clarity, UA Clear    Glucose, UA Negative Negative   Bilirubin, UA Negative    Ketones, UA Negative    Spec Grav, UA 1.015 1.010 - 1.025   Blood, UA Negative    pH, UA 6.5 5.0 - 8.0   Protein, UA Negative Negative   Urobilinogen, UA 0.2 0.2 or 1.0 E.U./dL   Nitrite, UA Negative    Leukocytes, UA Moderate (2+) (A) Negative    Culture pending   Patient Active Problem List   Diagnosis Date Noted   Right kidney mass 03/02/2024   Pyelonephritis 03/02/2024   Urinary urgency 03/02/2024   Vitamin D deficiency 12/21/2023   Stress reaction 12/20/2023   Encounter for physical examination related to employment 10/02/2023   Status post total right knee replacement 10/12/2022   Current use of proton pump inhibitor 11/20/2021   Genetic testing 09/15/2018   Family history of genetic disease carrier 09/02/2018   Family history of breast cancer    Family history of pancreatic cancer    Prostate cancer screening 08/17/2018   History of colonic polyps    Prediabetes 04/01/2016   Left ankle pain 10/14/2015   PVC's (premature ventricular contractions) 10/22/2013   Colon cancer screening 05/23/2012   Routine general medical examination at a health care facility 05/20/2012   Right knee pain 06/08/2011   G E R D 08/04/2007   Hyperlipidemia 07/22/2007   Morbid obesity (HCC) 07/22/2007   SMOKELESS TOBACCO ABUSE 07/22/2007   Hypertension, essential 07/22/2007   OSA (obstructive sleep apnea) 07/22/2007   Past Medical History:  Diagnosis Date   Allergy    Arthritis    right knee    Esophageal reflux    Family history of breast cancer    Family history of genetic disease carrier    sister NBN pathogenic variant   Family history of pancreatic cancer    Fatty liver    HLD (hyperlipidemia)    HTN (hypertension)    Morbid obesity (HCC)    Pre-diabetes    Sleep apnea    USES CPAP    Tobacco use disorder    Unspecified sleep apnea    Past Surgical History:  Procedure Laterality Date   COLONOSCOPY  2013   COLONOSCOPY WITH PROPOFOL N/A 05/20/2018   Procedure: COLONOSCOPY WITH PROPOFOL;  Surgeon: Beverley Fiedler, MD;  Location: [facility];  Service: Gastroenterology;  Laterality: N/A;   KNEE ARTHROSCOPY     left   TOTAL KNEE ARTHROPLASTY Right 10/12/2022   Procedure: RIGHT TOTAL KNEE ARTHROPLASTY;  Surgeon: Kathryne Hitch, MD;  Location: [facility];  Service: Orthopedics;  Laterality: Right;   Social History   Tobacco Use   Smoking status: Former    Current packs/day: 0.00    Average packs/day: 1 pack/day for 4.0 years (4.0  ttl pk-yrs)    Types: Cigarettes    Start date: 6    Quit date: 69    Years since quitting: 32.1   Smokeless tobacco: Current    Types: Snuff  Vaping Use   Vaping status: Never Used  Substance Use Topics   Alcohol use: Yes    Comment: rare   Drug use: No   Family History  Problem Relation Age of Onset   Hypertension Mother    Heart attack Mother        deceased   Heart disease Mother    Kidney disease Mother    Heart attack Father 76       deceased   Heart disease Father    Breast cancer Sister 72       NBN +   Lung cancer Brother    Kidney disease Maternal Grandfather    Colon cancer Neg Hx    Stomach cancer Neg Hx    Esophageal cancer Neg Hx    Rectal cancer Neg Hx    No Known Allergies Current Outpatient Medications on File Prior to Visit  Medication Sig Dispense Refill   ALPRAZolam (XANAX) 0.5 MG tablet Take 1 tablet (0.5 mg total) by mouth daily as needed for anxiety. 30 tablet 0   benzonatate (TESSALON)  200 MG capsule Take 1 capsule (200 mg total) by mouth 3 (three) times daily as needed for cough. Swallow whole, do not chew or suck the capsule. 20 capsule 0   cefpodoxime (VANTIN) 200 MG tablet Take 1 tablet (200 mg total) by mouth 2 (two) times daily. 28 tablet 0   cetirizine (ZYRTEC) 10 MG tablet Take 10 mg by mouth daily.     cholecalciferol (VITAMIN D3) 25 MCG (1000 UNIT) tablet Take 2,000 Units by mouth daily.     cyanocobalamin (VITAMIN B12) 1000 MCG tablet Take 500 mcg by mouth daily.     diclofenac Sodium (VOLTAREN) 1 % GEL Apply 1 Application topically 4 (four) times daily as needed (pain).     diphenhydrAMINE HCl, Sleep, (ZZZQUIL) 50 MG/30ML LIQD Take 50 mg by mouth at bedtime as needed (sleep).     FLUoxetine (PROZAC) 20 MG capsule Take 1 capsule (20 mg total) by mouth daily. 90 capsule 3   GLUCOSAMINE-CHONDROITIN PO Take 1 tablet by mouth 2 (two) times daily.     ibuprofen (ADVIL) 600 MG tablet Take 1 tablet (600 mg total) by mouth every 6 (six) hours as needed. 30 tablet 0   lisinopril-hydrochlorothiazide (ZESTORETIC) 20-25 MG tablet Take 1 tablet by mouth daily. 90 tablet 3   Melatonin 10 MG TABS Take 1 tablet by mouth daily.     methocarbamol (ROBAXIN) 500 MG tablet Take 1 tablet (500 mg total) by mouth every 6 (six) hours as needed for muscle spasms. 60 tablet 1   NON FORMULARY CPAP at night as directed      Omega-3 Fatty Acids (FISH OIL) 1200 MG CPDR Take 2,400 mg by mouth 2 (two) times daily.     omeprazole (PRILOSEC) 40 MG capsule Take 1 capsule (40 mg total) by mouth daily. 90 capsule 3   Oxymetazoline HCl (VICKS SINEX NA) Place 1 spray into the nose daily as needed (allergies).     phenazopyridine (PYRIDIUM) 200 MG tablet Take 1 tablet (200 mg total) by mouth 3 (three) times daily. 6 tablet 0   rosuvastatin (CRESTOR) 5 MG tablet Take 1 tablet (5 mg total) by mouth daily. 90 tablet 3   tirzepatide (ZEPBOUND) 5  MG/0.5ML Pen Inject 5 mg into the skin once a week. 2 mL 0   No  current facility-administered medications on file prior to visit.    Review of Systems  Constitutional:  Positive for fatigue. Negative for activity change, appetite change, fever and unexpected weight change.  HENT:  Negative for congestion, rhinorrhea, sore throat and trouble swallowing.   Eyes:  Negative for pain, redness, itching and visual disturbance.  Respiratory:  Negative for cough, chest tightness, shortness of breath and wheezing.   Cardiovascular:  Negative for chest pain and palpitations.  Gastrointestinal:  Negative for abdominal pain, blood in stool, constipation, diarrhea and nausea.  Endocrine: Negative for cold intolerance, heat intolerance, polydipsia and polyuria.  Genitourinary:  Positive for frequency and urgency. Negative for difficulty urinating and dysuria.  Musculoskeletal:  Negative for arthralgias, back pain, joint swelling and myalgias.  Skin:  Negative for pallor and rash.  Neurological:  Negative for dizziness, tremors, weakness, numbness and headaches.  Hematological:  Negative for adenopathy. Does not bruise/bleed easily.  Psychiatric/Behavioral:  Negative for decreased concentration and dysphoric mood. The patient is not nervous/anxious.        Objective:   Physical Exam Constitutional:      General: He is not in acute distress.    Appearance: Normal appearance. He is well-developed. He is obese. He is not ill-appearing or diaphoretic.  HENT:     Head: Normocephalic and atraumatic.     Mouth/Throat:     Mouth: Mucous membranes are moist.     Pharynx: Oropharynx is clear.  Eyes:     General: No scleral icterus.    Conjunctiva/sclera: Conjunctivae normal.     Pupils: Pupils are equal, round, and reactive to light.  Neck:     Thyroid: No thyromegaly.     Vascular: No carotid bruit or JVD.  Cardiovascular:     Rate and Rhythm: Regular rhythm. Bradycardia present.     Heart sounds: Normal heart sounds.     No gallop.  Pulmonary:     Effort:  Pulmonary effort is normal. No respiratory distress.     Breath sounds: Normal breath sounds. No wheezing or rales.  Abdominal:     General: There is no distension or abdominal bruit.     Palpations: Abdomen is soft. There is no mass.     Tenderness: There is no abdominal tenderness. There is no right CVA tenderness, left CVA tenderness, guarding or rebound.     Hernia: No hernia is present.  Musculoskeletal:     Cervical back: Normal range of motion and neck supple.     Right lower leg: No edema.     Left lower leg: No edema.  Lymphadenopathy:     Cervical: No cervical adenopathy.  Skin:    General: Skin is warm and dry.     Coloration: Skin is not pale.     Findings: No rash.  Neurological:     Mental Status: He is alert.     Coordination: Coordination normal.     Deep Tendon Reflexes: Reflexes are normal and symmetric. Reflexes normal.  Psychiatric:        Mood and Affect: Mood normal.           Assessment & Plan:   Problem List Items Addressed This Visit       Genitourinary   Pyelonephritis - Primary   Seen in ER  Reviewed hospital records, lab results and studies in detail   Continues the cefpodixime 200 mg bid Urinalysis  with leuk-sent for culture   Overall improved but still has urgency   Ref to urology for this and renal mass  Culture pending  Call back and Er precautions noted in detail today        Relevant Orders   Ambulatory referral to Urology   POCT Urinalysis Dipstick (Automated) (Completed)   Urine Culture     Other   Urinary urgency   With recent pyelo  Also possible prior  ? If emptying all the way  2nd ucx is pending  Taking cefpodixime  currently   Ref made to urology      Relevant Orders   Urine Culture   Right kidney mass   Incidental  CT noted 1.6 cm dense mass inf pole right kidney   Also recent pyelo and urgency   Ref to urology made       Relevant Orders   Ambulatory referral to Urology

## 2024-03-02 NOTE — Assessment & Plan Note (Signed)
 Incidental  CT noted 1.6 cm dense mass inf pole right kidney   Also recent pyelo and urgency   Ref to urology made

## 2024-03-02 NOTE — Assessment & Plan Note (Signed)
 With recent pyelo  Also possible prior  ? If emptying all the way  2nd ucx is pending  Taking cefpodixime  currently   Ref made to urology

## 2024-03-02 NOTE — Patient Instructions (Addendum)
 Stick to water (can put a squeeze of juice if needed)  Avoid artifical sweeteners for now   I put the referral in to urology  Please let us know if you don't hear from someone this week   If symptoms worsen in the meantime let us know   We will re check a urine sample when you can provide one

## 2024-03-02 NOTE — Assessment & Plan Note (Addendum)
 Seen in ER  Reviewed hospital records, lab results and studies in detail   Continues the cefpodixime 200 mg bid Urinalysis with leuk-sent for culture   Overall improved but still has urgency   Ref to urology for this and renal mass  Culture pending  Call back and Er precautions noted in detail today

## 2024-03-03 ENCOUNTER — Telehealth: Payer: Self-pay | Admitting: Family Medicine

## 2024-03-03 LAB — URINE CULTURE
MICRO NUMBER:: 16150599
Result:: NO GROWTH
SPECIMEN QUALITY:: ADEQUATE

## 2024-03-03 NOTE — Telephone Encounter (Signed)
 Pt notified letter ready for pick up. He is going to print it out from his mychart but if there is any issues he will pick up the one we have printed

## 2024-03-03 NOTE — Telephone Encounter (Signed)
 Done and in IN box

## 2024-03-03 NOTE — Telephone Encounter (Signed)
 Copied from CRM [id number]. Topic: General - Other >> Mar 03, 2024  9:42 AM Isabell A wrote: Reason for CRM: Patient is requesting an absence of work note with the dates of 3/5-3/9 due to his UTI. Patient is requesting a call back.

## 2024-03-04 ENCOUNTER — Other Ambulatory Visit: Payer: Self-pay | Admitting: Urology

## 2024-03-04 DIAGNOSIS — D49511 Neoplasm of unspecified behavior of right kidney: Secondary | ICD-10-CM

## 2024-03-11 ENCOUNTER — Other Ambulatory Visit (HOSPITAL_COMMUNITY): Payer: Self-pay

## 2024-03-24 ENCOUNTER — Ambulatory Visit (HOSPITAL_COMMUNITY)
Admission: RE | Admit: 2024-03-24 | Discharge: 2024-03-24 | Disposition: A | Discharge status: Home / Self Care | Source: Ambulatory Visit | Attending: Urology | Admitting: Urology

## 2024-03-24 DIAGNOSIS — D49511 Neoplasm of unspecified behavior of right kidney: Secondary | ICD-10-CM | POA: Insufficient documentation

## 2024-03-24 MED ORDER — GADOBUTROL 1 MMOL/ML IV SOLN
10.0000 mL | Freq: Once | INTRAVENOUS | Status: AC | PRN
  Administered 2024-03-24: 10 mL via INTRAVENOUS

## 2024-03-31 ENCOUNTER — Other Ambulatory Visit: Payer: Self-pay | Admitting: Family Medicine

## 2024-04-01 MED ORDER — TIRZEPATIDE-WEIGHT MANAGEMENT 7.5 MG/0.5ML ~~LOC~~ SOAJ: 7.5000 mg | SUBCUTANEOUS | 0 refills | Status: DC

## 2024-04-01 NOTE — Telephone Encounter (Signed)
 LVM for patient to c/b and schedule.

## 2024-04-01 NOTE — Telephone Encounter (Signed)
 Mychart refill request, pt put a message on refill saying:  Patient comment: Dr Milinda Antis  can  send  the  next  refill  for  me  thing  are  going  well  no  problems i think  next  level  is  likle  7.5  please  make  sure  its  the  auto injection  please  ... thank  you  thing  are  great     Last refilled on 02/27/24 #2 mL/ 0 refills Last OV was a ER f/u on 03/02/24

## 2024-04-01 NOTE — Telephone Encounter (Signed)
 I sent in the 7.5   Please follow up in about 2 months to check in

## 2024-04-02 NOTE — Telephone Encounter (Signed)
LVM for pt to c/b and sched.

## 2024-04-03 ENCOUNTER — Ambulatory Visit: Payer: Self-pay

## 2024-04-03 NOTE — Telephone Encounter (Signed)
 Copied from CRM [id number]. Topic: Clinical - Medication Question >> Apr 03, 2024 11:34 AM Charles Morales wrote: Reason for CRM: Patient is calling because he would like to go over some information about prescriptions he has currently received

## 2024-04-03 NOTE — Telephone Encounter (Signed)
Second attempt to reach pt. Left message to call back. 

## 2024-04-03 NOTE — Telephone Encounter (Signed)
Third attempt to reach pt. Left message to call back.

## 2024-04-03 NOTE — Telephone Encounter (Signed)
 Called pt and schedule appt for 2 month f/u

## 2024-04-06 ENCOUNTER — Other Ambulatory Visit: Payer: Self-pay | Admitting: Family Medicine

## 2024-04-22 ENCOUNTER — Other Ambulatory Visit: Payer: Self-pay | Admitting: Family Medicine

## 2024-04-22 MED ORDER — TIRZEPATIDE-WEIGHT MANAGEMENT 10 MG/0.5ML ~~LOC~~ SOAJ: 10.0000 mg | SUBCUTANEOUS | 0 refills | Status: DC

## 2024-04-22 NOTE — Telephone Encounter (Signed)
 Patient scheduled.

## 2024-04-22 NOTE — Telephone Encounter (Signed)
 Glad you are doing well  Please schedule follow up in next 2-3 months to check in

## 2024-04-22 NOTE — Telephone Encounter (Signed)
 See pt's comments saying:   Patient comment: Dr Malissa Se  i think  we  move  up  to 10mg  now  thing are  great no  problems with the  meds  at all thank  again     Last filled on 04/01/24 # 2 mL/ 0 refills   F/u scheduled on 06/04/24

## 2024-04-22 NOTE — Telephone Encounter (Signed)
 LVM for patient to c/b and schedule.

## 2024-04-23 ENCOUNTER — Other Ambulatory Visit: Payer: Self-pay | Admitting: Family Medicine

## 2024-04-27 NOTE — Telephone Encounter (Signed)
 Pt. Give message, verbalizes understanding.

## 2024-05-31 ENCOUNTER — Encounter: Payer: Self-pay | Admitting: Family Medicine

## 2024-06-02 MED ORDER — ZEPBOUND 12.5 MG/0.5ML ~~LOC~~ SOAJ: 12.5000 mg | SUBCUTANEOUS | 0 refills | Status: DC

## 2024-06-04 ENCOUNTER — Ambulatory Visit: Admitting: Family Medicine

## 2024-06-04 ENCOUNTER — Ambulatory Visit: Payer: Self-pay | Admitting: Family Medicine

## 2024-06-04 ENCOUNTER — Encounter: Payer: Self-pay | Admitting: Family Medicine

## 2024-06-04 VITALS — BP 128/80 | HR 55 | Temp 98.1°F | Ht 70.0 in | Wt 345.0 lb

## 2024-06-04 DIAGNOSIS — G4733 Obstructive sleep apnea (adult) (pediatric): Secondary | ICD-10-CM

## 2024-06-04 DIAGNOSIS — R7303 Prediabetes: Secondary | ICD-10-CM | POA: Diagnosis not present

## 2024-06-04 DIAGNOSIS — E559 Vitamin D deficiency, unspecified: Secondary | ICD-10-CM | POA: Diagnosis not present

## 2024-06-04 DIAGNOSIS — I10 Essential (primary) hypertension: Secondary | ICD-10-CM

## 2024-06-04 LAB — COMPREHENSIVE METABOLIC PANEL WITH GFR
ALT: 27 U/L (ref 0–53)
AST: 22 U/L (ref 0–37)
Albumin: 4.3 g/dL (ref 3.5–5.2)
Alkaline Phosphatase: 42 U/L (ref 39–117)
BUN: 16 mg/dL (ref 6–23)
CO2: 25 meq/L (ref 19–32)
Calcium: 9.6 mg/dL (ref 8.4–10.5)
Chloride: 102 meq/L (ref 96–112)
Creatinine, Ser: 1.05 mg/dL (ref 0.40–1.50)
GFR: 76.46 mL/min (ref >60.00)
Glucose, Bld: 89 mg/dL (ref 70–99)
Potassium: 4.2 meq/L (ref 3.5–5.1)
Sodium: 139 meq/L (ref 135–145)
Total Bilirubin: 0.8 mg/dL (ref 0.2–1.2)
Total Protein: 7.1 g/dL (ref 6.0–8.3)

## 2024-06-04 LAB — HEMOGLOBIN A1C: Hgb A1c MFr Bld: 5.5 % (ref 4.6–6.5)

## 2024-06-04 MED ORDER — FLUOXETINE HCL 20 MG PO CAPS: 20.0000 mg | ORAL_CAPSULE | Freq: Every day | ORAL | 3 refills | Status: AC

## 2024-06-04 NOTE — Assessment & Plan Note (Signed)
 Continues cpap  Continues weight loss with mounaro  Feeling good

## 2024-06-04 NOTE — Patient Instructions (Addendum)
 Try to get most of your carbohydrates from produce (with the exception of white potatoes) and whole grains Eat less bread/pasta/rice/snack foods/cereals/sweets and other items from the middle of the grocery store (processed carbs)  Lean protein  The following are examples of protein in diet  Meat (lean)  Fish  Eggs  Dairy products (lean)  Soy products  Oat milk  Almond milk Nuts and nut butters  Dried beans     If your time is limited for exercise - preference the strength training  Cardio when you can Add some strength training to your routine, this is important for bone and brain health and can reduce your risk of falls and help your body use insulin properly and regulate weight  Light weights, exercise bands , and internet videos are a good way to start  Yoga (chair or regular), machines , floor exercises or a gym with machines are also good options   Labs today

## 2024-06-04 NOTE — Assessment & Plan Note (Addendum)
 Discussed how this problem influences overall health and the risks it imposes  Reviewed plan for weight loss with lower calorie diet (via better food choices (lower glycemic and portion control) along with exercise building up to or more than 30 minutes 5 days per week including some aerobic activity and strength training   Going up on mounjaro  to 12.5 this week  After a month if well tolerated will go up to 15 mg weekly  Strongly encouraged strength training to prevent muscle loss-is doing well with that   Weight is down from 366 to 345 lb so far

## 2024-06-04 NOTE — Assessment & Plan Note (Signed)
 bp in fair control at this time (despite weight gain) BP Readings from Last 1 Encounters:  06/04/24 128/80   No changes needed Most recent labs reviewed  Disc lifstyle change with low sodium diet and exercise  Plan to continue lisinopril  hct 20-25 mg daily  Lab today

## 2024-06-04 NOTE — Assessment & Plan Note (Signed)
 Lab today for A1c (prevention 6.1)   Eating better  Taking glp-1 mounjaro  12.5 weekly (just increase to that dose)  Expect improvement  Also weight loss   disc imp of low glycemic diet and wt loss to prevent DM2

## 2024-06-04 NOTE — Assessment & Plan Note (Signed)
 Taking 2000 international units vitamin D3 daily

## 2024-06-04 NOTE — Progress Notes (Signed)
 Subjective:    Patient ID: Charles Morales, male    DOB: January 09, 1961, 63 y.o.   MRN: [id number]  HPI  Wt Readings from Last 3 Encounters:  06/04/24 (!) 345 lb (156.5 kg)  03/02/24 (!) 357 lb 6 oz (162.1 kg)  02/19/24 (!) 362 lb (164.2 kg)   49.50 kg/m  Vitals:   06/04/24 0826  BP: 128/80  Pulse: (!) 55  Temp: 98.1 F (36.7 C)  SpO2: 96%   Here for follow up of prediabetes , HTN and chronic medical problems   HTN bp is stable today  No cp or palpitations or headaches or edema  No side effects to medicines  BP Readings from Last 3 Encounters:  06/04/24 128/80  03/02/24 136/88  02/27/24 101/63     Lisinopril  hct 20-25 mg daily   Pulse Readings from Last 3 Encounters:  06/04/24 (!) 55  03/02/24 (!) 58  02/27/24 71    Lab Results  Component Value Date   NA 137 02/27/2024   K 3.7 02/27/2024   CO2 25 02/27/2024   GLUCOSE 112 (H) 02/27/2024   BUN 16 02/27/2024   CREATININE 1.03 02/27/2024   CALCIUM  10.1 02/27/2024   GFR 79.43 12/12/2023   GFRNONAA >60 02/27/2024    Prediabetes in setting of mobid obesity   Lab Results  Component Value Date   HGBA1C 6.1 12/12/2023   HGBA1C 5.7 12/13/2022   HGBA1C 6.0 11/20/2021   Taking tirzepatide  12.5 mg weekly-recently went up on dose   Peak weight 366 in November  Down to 345   Does not get hungry as much  Eating much smaller portions Shares plate with wife or taking food home if eating out  Cravings are better (especially late night snacking)  Not craving sugar as much (has a little/not a lot)   Choices are improved More vegetables Chicken -more of / baked  Much less fried foods  Much less bread (and chooses wheat)  No soft drinks   Exercise - 3 days per week if schedule allows (1.5 hours)  Walking and using weight room     Doing well with fluoxetine  for mood  Needs refill    Lab Results  Component Value Date   ALT 23 12/12/2023   AST 19 12/12/2023   ALKPHOS 53 12/12/2023   BILITOT 0.9  12/12/2023   Lab Results  Component Value Date   WBC 12.3 (H) 02/27/2024   HGB 16.2 02/27/2024   HCT 47.2 02/27/2024   MCV 86.8 02/27/2024   PLT 196 02/27/2024   Lab Results  Component Value Date   CHOL 97 12/12/2023   HDL 30.50 (L) 12/12/2023   LDLCALC 49 12/12/2023   TRIG 90.0 12/12/2023   CHOLHDL 3 12/12/2023   Last vitamin D  Lab Results  Component Value Date   VD25OH 21.01 (L) 12/12/2023   Lab Results  Component Value Date   VITAMINB12 1,226 (H) 12/12/2023  Cut the B12 to every other day   Taking vitamin D  2000 international units daily     Patient Active Problem List   Diagnosis Date Noted   Right kidney mass 03/02/2024   Pyelonephritis 03/02/2024   Urinary urgency 03/02/2024   Vitamin D  deficiency 12/21/2023   Stress reaction 12/20/2023   Encounter for physical examination related to employment 10/02/2023   Status post total right knee replacement 10/12/2022   Current use of proton pump inhibitor 11/20/2021   Genetic testing 09/15/2018   Family history of genetic disease carrier 09/02/2018  Family history of breast cancer    Family history of pancreatic cancer    Prostate cancer screening 08/17/2018   History of colonic polyps    Prediabetes 04/01/2016   Left ankle pain 10/14/2015   PVC's (premature ventricular contractions) 10/22/2013   Colon cancer screening 05/23/2012   Routine general medical examination at a health care facility 05/20/2012   Right knee pain 06/08/2011   G E R D 08/04/2007   Hyperlipidemia 07/22/2007   Morbid obesity (HCC) 07/22/2007   SMOKELESS TOBACCO ABUSE 07/22/2007   Hypertension, essential 07/22/2007   OSA (obstructive sleep apnea) 07/22/2007   Past Medical History:  Diagnosis Date   Allergy    Arthritis    right knee   Esophageal reflux    Family history of breast cancer    Family history of genetic disease carrier    sister NBN pathogenic variant   Family history of pancreatic cancer    Fatty liver    HLD  (hyperlipidemia)    HTN (hypertension)    Morbid obesity (HCC)    Pre-diabetes    Sleep apnea    USES CPAP    Tobacco use disorder    Unspecified sleep apnea    Past Surgical History:  Procedure Laterality Date   COLONOSCOPY  2013   COLONOSCOPY WITH PROPOFOL  N/A 05/20/2018   Procedure: COLONOSCOPY WITH PROPOFOL ;  Surgeon: Nannette Babe, MD;  Location: [facility];  Service: Gastroenterology;  Laterality: N/A;   KNEE ARTHROSCOPY     left   TOTAL KNEE ARTHROPLASTY Right 10/12/2022   Procedure: RIGHT TOTAL KNEE ARTHROPLASTY;  Surgeon: Arnie Lao, MD;  Location: [facility];  Service: Orthopedics;  Laterality: Right;   Social History   Tobacco Use   Smoking status: Former    Current packs/day: 0.00    Average packs/day: 1 pack/day for 4.0 years (4.0 ttl pk-yrs)    Types: Cigarettes    Start date: 53    Quit date: 80    Years since quitting: 32.4   Smokeless tobacco: Current    Types: Snuff  Vaping Use   Vaping status: Never Used  Substance Use Topics   Alcohol use: Yes    Comment: rare   Drug use: No   Family History  Problem Relation Age of Onset   Hypertension Mother    Heart attack Mother        deceased   Heart disease Mother    Kidney disease Mother    Heart attack Father 43       deceased   Heart disease Father    Breast cancer Sister 15       NBN +   Lung cancer Brother    Kidney disease Maternal Grandfather    Colon cancer Neg Hx    Stomach cancer Neg Hx    Esophageal cancer Neg Hx    Rectal cancer Neg Hx    No Known Allergies Current Outpatient Medications on File Prior to Visit  Medication Sig Dispense Refill   ALPRAZolam  (XANAX ) 0.5 MG tablet Take 1 tablet (0.5 mg total) by mouth daily as needed for anxiety. 30 tablet 0   benzonatate  (TESSALON ) 200 MG capsule Take 1 capsule (200 mg total) by mouth 3 (three) times daily as needed for cough. Swallow whole, do not chew or suck the capsule. 20 capsule 0   cefpodoxime  (VANTIN ) 200 MG tablet  Take 1 tablet (200 mg total) by mouth 2 (two) times daily. 28 tablet 0   cetirizine (ZYRTEC)  10 MG tablet Take 10 mg by mouth daily.     cholecalciferol (VITAMIN D3) 25 MCG (1000 UNIT) tablet Take 2,000 Units by mouth daily.     cyanocobalamin  (VITAMIN B12) 1000 MCG tablet Take 500 mcg by mouth daily.     diclofenac Sodium (VOLTAREN) 1 % GEL Apply 1 Application topically 4 (four) times daily as needed (pain).     diphenhydrAMINE  HCl, Sleep, (ZZZQUIL) 50 MG/30ML LIQD Take 50 mg by mouth at bedtime as needed (sleep).     GLUCOSAMINE-CHONDROITIN PO Take 1 tablet by mouth 2 (two) times daily.     ibuprofen  (ADVIL ) 600 MG tablet Take 1 tablet (600 mg total) by mouth every 6 (six) hours as needed. 30 tablet 0   lisinopril -hydrochlorothiazide  (ZESTORETIC ) 20-25 MG tablet Take 1 tablet by mouth daily. 90 tablet 3   Melatonin 10 MG TABS Take 2 tablets by mouth daily.     methocarbamol  (ROBAXIN ) 500 MG tablet Take 1 tablet (500 mg total) by mouth every 6 (six) hours as needed for muscle spasms. 60 tablet 1   NON FORMULARY CPAP at night as directed      Omega-3 Fatty Acids (FISH OIL) 1200 MG CPDR Take 2,400 mg by mouth 2 (two) times daily.     omeprazole  (PRILOSEC) 40 MG capsule Take 1 capsule (40 mg total) by mouth daily. 90 capsule 3   Oxymetazoline HCl (VICKS SINEX NA) Place 1 spray into the nose daily as needed (allergies).     phenazopyridine  (PYRIDIUM ) 200 MG tablet Take 1 tablet (200 mg total) by mouth 3 (three) times daily. 6 tablet 0   rosuvastatin  (CRESTOR ) 5 MG tablet Take 1 tablet (5 mg total) by mouth daily. 90 tablet 3   tirzepatide  (ZEPBOUND ) 12.5 MG/0.5ML Pen Inject 12.5 mg into the skin once a week. 2 mL 0   No current facility-administered medications on file prior to visit.    Review of Systems  Constitutional:  Positive for appetite change. Negative for activity change, fatigue, fever and unexpected weight change.  HENT:  Negative for congestion, rhinorrhea, sore throat and trouble  swallowing.   Eyes:  Negative for pain, redness, itching and visual disturbance.  Respiratory:  Negative for cough, chest tightness, shortness of breath and wheezing.   Cardiovascular:  Negative for chest pain and palpitations.  Gastrointestinal:  Negative for abdominal pain, blood in stool, constipation, diarrhea and nausea.  Endocrine: Negative for cold intolerance, heat intolerance, polydipsia and polyuria.  Genitourinary:  Negative for difficulty urinating, dysuria, frequency and urgency.  Musculoskeletal:  Negative for arthralgias, joint swelling and myalgias.  Skin:  Negative for pallor and rash.  Neurological:  Negative for dizziness, tremors, weakness, numbness and headaches.  Hematological:  Negative for adenopathy. Does not bruise/bleed easily.  Psychiatric/Behavioral:  Negative for decreased concentration and dysphoric mood. The patient is not nervous/anxious.        Objective:   Physical Exam Constitutional:      General: He is not in acute distress.    Appearance: Normal appearance. He is well-developed. He is obese. He is not ill-appearing or diaphoretic.  HENT:     Head: Normocephalic and atraumatic.  Eyes:     Conjunctiva/sclera: Conjunctivae normal.     Pupils: Pupils are equal, round, and reactive to light.  Neck:     Thyroid : No thyromegaly.     Vascular: No carotid bruit or JVD.  Cardiovascular:     Rate and Rhythm: Normal rate and regular rhythm.     Heart sounds:  Normal heart sounds.     No gallop.  Pulmonary:     Effort: Pulmonary effort is normal. No respiratory distress.     Breath sounds: Normal breath sounds. No wheezing or rales.  Abdominal:     General: There is no distension or abdominal bruit.     Palpations: Abdomen is soft.  Musculoskeletal:     Cervical back: Normal range of motion and neck supple.     Right lower leg: No edema.     Left lower leg: No edema.  Lymphadenopathy:     Cervical: No cervical adenopathy.  Skin:    General: Skin  is warm and dry.     Coloration: Skin is not pale.     Findings: No rash.  Neurological:     Mental Status: He is alert.     Coordination: Coordination normal.     Deep Tendon Reflexes: Reflexes are normal and symmetric. Reflexes normal.  Psychiatric:        Mood and Affect: Mood normal.           Assessment & Plan:   Problem List Items Addressed This Visit       Cardiovascular and Mediastinum   Hypertension, essential   bp in fair control at this time (despite weight gain) BP Readings from Last 1 Encounters:  06/04/24 128/80   No changes needed Most recent labs reviewed  Disc lifstyle change with low sodium diet and exercise  Plan to continue lisinopril  hct 20-25 mg daily  Lab today       Relevant Orders   Comprehensive metabolic panel with GFR     Respiratory   OSA (obstructive sleep apnea)   Continues cpap  Continues weight loss with mounaro  Feeling good        Other   Vitamin D  deficiency   Taking 2000 international units vitamin D3 daily      Prediabetes - Primary   Lab today for A1c (prevention 6.1)   Eating better  Taking glp-1 mounjaro  12.5 weekly (just increase to that dose)  Expect improvement  Also weight loss   disc imp of low glycemic diet and wt loss to prevent DM2       Relevant Orders   Hemoglobin A1c   Morbid obesity (HCC)   Discussed how this problem influences overall health and the risks it imposes  Reviewed plan for weight loss with lower calorie diet (via better food choices (lower glycemic and portion control) along with exercise building up to or more than 30 minutes 5 days per week including some aerobic activity and strength training   Going up on mounjaro  to 12.5 this week  After a month if well tolerated will go up to 15 mg weekly  Strongly encouraged strength training to prevent muscle loss-is doing well with that   Weight is down from 366 to 345 lb so far        Other Visit Diagnoses       Obstructive sleep  apnea syndrome

## 2024-06-25 ENCOUNTER — Telehealth: Payer: Self-pay

## 2024-06-25 NOTE — Telephone Encounter (Signed)
 Pharmacy Patient Advocate Encounter   Received notification from CoverMyMeds that prior authorization for Zepbound  2.5MG /0.5ML pen-injectors is required/requested.   Insurance verification completed.   The patient is insured through [facility] .   Per test claim: PA required; PA submitted to above mentioned insurance via CoverMyMeds Key/confirmation #/EOC [id number] Status is pending

## 2024-07-02 MED ORDER — ZEPBOUND 15 MG/0.5ML ~~LOC~~ SOAJ: 15.0000 mg | SUBCUTANEOUS | 0 refills | Status: DC

## 2024-07-02 NOTE — Addendum Note (Signed)
 Addended by: RANDEEN HARDY A on: 07/02/2024 05:04 PM   Modules accepted: Orders

## 2024-07-09 ENCOUNTER — Other Ambulatory Visit (HOSPITAL_COMMUNITY): Payer: Self-pay

## 2024-07-09 NOTE — Telephone Encounter (Signed)
 Pharmacy Patient Advocate Encounter  Received notification from [employer] that Prior Authorization for Zepbound  2.5MG /0.5ML pen-injectors has been APPROVED from 06/25/2024 to 06/25/2025. Ran test claim, Copay is $24.99. This test claim was processed through [facility]- copay amounts may vary at other pharmacies due to pharmacy/plan contracts, or as the patient moves through the different stages of their insurance plan.   PA #/Case ID/Reference #: [id number]

## 2024-07-10 NOTE — Telephone Encounter (Signed)
Left VM letting pharmacy know PA was approved

## 2024-07-29 MED ORDER — ZEPBOUND 15 MG/0.5ML ~~LOC~~ SOAJ: 15.0000 mg | SUBCUTANEOUS | 0 refills | Status: DC

## 2024-07-29 NOTE — Addendum Note (Signed)
 Addended by: TENNIE RAISIN B on: 07/29/2024 08:38 AM   Modules accepted: Orders

## 2024-08-18 ENCOUNTER — Ambulatory Visit: Admitting: Internal Medicine

## 2024-08-18 ENCOUNTER — Encounter: Payer: Self-pay | Admitting: Internal Medicine

## 2024-08-18 VITALS — BP 126/80 | HR 61 | Temp 98.2°F | Ht 70.0 in | Wt 335.2 lb

## 2024-08-18 DIAGNOSIS — Z6841 Body Mass Index (BMI) 40.0 and over, adult: Secondary | ICD-10-CM

## 2024-08-18 DIAGNOSIS — E669 Obesity, unspecified: Secondary | ICD-10-CM

## 2024-08-18 DIAGNOSIS — Z87891 Personal history of nicotine dependence: Secondary | ICD-10-CM

## 2024-08-18 DIAGNOSIS — G4733 Obstructive sleep apnea (adult) (pediatric): Secondary | ICD-10-CM

## 2024-08-18 NOTE — Patient Instructions (Addendum)
 Excellent Job A+ GOLD STAR!!  Continue CPAP as prescribed  Patient Instructions Continue to use CPAP every night, minimum of 4-6 hours a night.  Change equipment every 30 days or as directed by DME.  Wash your tubing with warm soap and water daily, hang to dry. Wash humidifier portion weekly. Use bottled, distilled water and change daily   Be aware of reduced alertness and do not drive or operate heavy machinery if experiencing this or drowsiness.  Exercise encouraged, as tolerated. Encouraged proper weight management.  Important to get eight or more hours of sleep  Limiting the use of the computer and television before bedtime.  Decrease naps during the day, so night time sleep will become enhanced.  Limit caffeine, and sleep deprivation.    Avoid Allergens and Irritants Avoid secondhand smoke Avoid SICK contacts Recommend  Masking  when appropriate Recommend Keep up-to-date with vaccinations   CONGRATULATIONS ON WEIGHT LOSS!!

## 2024-08-18 NOTE — Progress Notes (Signed)
 Name: Charles Morales MRN: [id number] DOB: September 07, 1961      CHIEF COMPLAINT:  Follow up assessment of sleep apnea Initial AHI  13   HISTORY OF PRESENT ILLNESS: Patient seen today for assessment of sleep apnea Has been diagnosed with sleep apnea for many years Originally diagnosed in 2018 with OSA AHI 13   Discussed sleep data and reviewed with patient.  Encouraged proper weight management.  Discussed driving precautions and its relationship with hypersomnolence.  Discussed sleep hygiene, and benefits of a fixed sleep waked time.  The importance of getting eight or more hours of sleep discussed with patient.  Discussed limiting the use of the computer and television before bedtime.  Decrease naps during the day, so night time sleep will become enhanced.  Limit caffeine, and sleep deprivation.   Patient uses and benefits from therapy Using CPAP nightly and with naps Pressure setting is comfortable and is sleeping well.  CPAP DL 01/7973  899% compliance for days 100%compliance for greater than 4 hours  AHI reduced to 2.0(initial AHI 13)   No exacerbation at this time No evidence of heart failure at this time No evidence or signs of infection at this time No respiratory distress No fevers, chills, nausea, vomiting, diarrhea No evidence of lower extremity edema No evidence hemoptysis     PAST MEDICAL HISTORY :   has a past medical history of Allergy, Arthritis, Esophageal reflux, Family history of breast cancer, Family history of genetic disease carrier, Family history of pancreatic cancer, Fatty liver, HLD (hyperlipidemia), HTN (hypertension), Morbid obesity (HCC), Pre-diabetes, Sleep apnea, Tobacco use disorder, and Unspecified sleep apnea.  has a past surgical history that includes Knee arthroscopy; Colonoscopy (2013); Colonoscopy with propofol  (N/A, 05/20/2018); and Total knee arthroplasty (Right, 10/12/2022). Prior to Admission medications   Medication Sig Start  Date End Date Taking? Authorizing Provider  ALPRAZolam  (XANAX ) 0.5 MG tablet Take 1 tablet (0.5 mg total) by mouth daily as needed for anxiety. 09/15/20   Tower, Laine LABOR, MD  amoxicillin  (AMOXIL ) 500 MG tablet Take 2 by mouth one hour before dental appt, then 2 by mouth six hours after. 10/30/22   Vernetta Lonni GRADE, MD  aspirin  EC 325 MG tablet Take 1 tablet (325 mg total) by mouth 2 (two) times daily. Patient not taking: Reported on 10/29/2022 10/13/22   Vernetta Lonni GRADE, MD  cetirizine (ZYRTEC) 10 MG tablet Take 10 mg by mouth daily.    [provider]  cyanocobalamin  (VITAMIN B12) 1000 MCG tablet Take 1,000 mcg by mouth daily.    [provider]  diclofenac Sodium (VOLTAREN) 1 % GEL Apply 1 Application topically 4 (four) times daily as needed (pain).    [provider]  diphenhydrAMINE  HCl, Sleep, (ZZZQUIL) 50 MG/30ML LIQD Take 50 mg by mouth at bedtime as needed (sleep).    [provider]  GLUCOSAMINE-CHONDROITIN PO Take 1 tablet by mouth 2 (two) times daily.    [provider]  HYDROmorphone  (DILAUDID ) 4 MG tablet Take 1 tablet (4 mg total) by mouth every 4 (four) hours as needed for severe pain. 11/02/22   Vernetta Lonni GRADE, MD  lisinopril -hydrochlorothiazide  (ZESTORETIC ) 20-25 MG tablet TAKE 1 TABLET BY MOUTH EVERY DAY 11/30/22   Tower, Laine LABOR, MD  methocarbamol  (ROBAXIN ) 500 MG tablet Take 1 tablet (500 mg total) by mouth every 6 (six) hours as needed for muscle spasms. 10/13/22   Vernetta Lonni GRADE, MD  NON FORMULARY CPAP at night as directed     [provider]  Omega-3 Fatty Acids (FISH OIL) 1200 MG CPDR Take 2,400 mg by mouth 2 (two) times daily.    [provider]  omeprazole  (PRILOSEC) 40 MG capsule TAKE 1 CAPSULE (40 MG TOTAL) BY MOUTH DAILY. 11/30/22   Tower, Laine LABOR, MD  Oxymetazoline HCl (VICKS SINEX NA) Place 1 spray into the nose daily as needed (allergies).    [provider]   rosuvastatin  (CRESTOR ) 5 MG tablet TAKE 1 TABLET (5 MG TOTAL) BY MOUTH DAILY. 11/30/22   Tower, Laine LABOR, MD   No Known Allergies  FAMILY HISTORY:  family history includes Breast cancer (age of onset: 26) in his sister; Heart attack in his mother; Heart attack (age of onset: 32) in his father; Heart disease in his father and mother; Hypertension in his mother; Kidney disease in his maternal grandfather and mother; Lung cancer in his brother. SOCIAL HISTORY:  reports that he quit smoking about 32 years ago. His smoking use included cigarettes. He started smoking about 36 years ago. He has a 4 pack-year smoking history. His smokeless tobacco use includes snuff. He reports current alcohol use. He reports that he does not use drugs.   BP 126/80 (BP Location: Right Arm, Patient Position: Sitting, Cuff Size: Large)   Pulse 61   Temp 98.2 F (36.8 C) (Oral)   Ht 5' 10 (1.778 m)   Wt (!) 335 lb 3.2 oz (152 kg)   SpO2 95%   BMI 48.10 kg/m     Review of Systems: Gen:  Denies  fever, sweats, chills weight loss  HEENT: Denies blurred vision, double vision, ear pain, eye pain, hearing loss, nose bleeds, sore throat Cardiac:  No dizziness, chest pain or heaviness, chest tightness,edema, No JVD Resp:   No cough, -sputum production, -shortness of breath,-wheezing, -hemoptysis,  Other:  All other systems negative   Physical Examination:   General Appearance: No distress  EYES PERRLA, EOM intact.   NECK Supple, No JVD Pulmonary: normal breath sounds, No wheezing.  CardiovascularNormal S1,S2.  No m/r/g.   Abdomen: Benign, Soft, non-tender. Neurology UE/LE 5/5 strength, no focal deficits Ext pulses intact, cap refill intact ALL OTHER ROS ARE NEGATIVE      ASSESSMENT AND PLAN SYNOPSIS 63 year old pleasant white male seen today for follow-up assessment for OSA   Assessment of OSA Previous AHI 13 Continue CPAP as prescribed  Excellent compliance report Reviewed compliance report in  detail with patient Patient definitely benefits the use of CPAP therapy as prescribed Using CPAP nightly and with naps Pressure setting is comfortable and is sleeping well. CPAP prescription 12-18 AHI reduced to 2  No evidence of acute heart failure at this time No respiratory distress No fevers, chills, nausea, vomiting, diarrhea No evidence hemoptysis  Patient Instructions Continue to use CPAP every night, minimum of 4-6 hours a night.  Change equipment every 30 days or as directed by DME.  Wash your tubing with warm soap and water daily, hang to dry. Wash humidifier portion weekly. Use bottled, distilled water and change daily   Be aware of reduced alertness and do not drive or operate heavy machinery if experiencing this or drowsiness.  Exercise encouraged, as tolerated. Encouraged proper weight management.  Important to get eight or more hours of sleep  Limiting the use of the computer and television before bedtime.  Decrease naps during the day, so night time sleep will become enhanced.  Limit caffeine, and sleep deprivation.  HTN, stroke, uncontrolled diabetes and heart failure are potential  risk factors.  Risk of untreated sleep apnea including cardiac arrhthymias, stroke, DM, pulm HTN.    Obesity -recommend significant weight loss -recommend changing diet Currently on Zepbound  patient states he lost 50 pounds Initial weight to be 385 current weight 335  Deconditioned state -Recommend increased daily activity and exercise     MEDICATION ADJUSTMENTS/LABS AND TESTS ORDERED: Please continue with CPAP as prescribed Avoid Allergens and Irritants Avoid secondhand smoke Avoid SICK contacts Recommend  Masking  when appropriate Recommend Keep up-to-date with vaccinations   CURRENT MEDICATIONS REVIEWED AT LENGTH WITH PATIENT TODAY   Patient  satisfied with Plan of action and management. All questions answered   Follow up 1 year   I spent a total of 41 minutes  dedicated to the care of this patient on the date of this encounter to include pre-visit review of records, face-to-face time with the patient discussing conditions above, post visit ordering of testing, clinical documentation with the electronic health record, making appropriate referrals as documented, and communicating necessary information to the patient's healthcare team.    The Patient requires high complexity decision making for assessment and support, frequent evaluation and titration of therapies, application of advanced monitoring technologies and extensive interpretation of multiple databases.  Patient satisfied with Plan of action and management. All questions answered    Nickolas Alm Cellar, M.D.  [facility]  Medical Director [facility] Medical Director [facility] Cardio-Pulmonary Department

## 2024-08-27 ENCOUNTER — Other Ambulatory Visit: Payer: Self-pay | Admitting: Family Medicine

## 2024-08-27 NOTE — Telephone Encounter (Signed)
 Last filled on 07/29/24 # 2 mL/ 0 refills   Last OV was 06/04/24

## 2024-09-10 ENCOUNTER — Ambulatory Visit: Admitting: Orthopaedic Surgery

## 2024-11-02 ENCOUNTER — Encounter: Payer: Self-pay | Admitting: Radiology

## 2024-11-11 ENCOUNTER — Other Ambulatory Visit (INDEPENDENT_AMBULATORY_CARE_PROVIDER_SITE_OTHER): Payer: Self-pay

## 2024-11-11 ENCOUNTER — Ambulatory Visit (INDEPENDENT_AMBULATORY_CARE_PROVIDER_SITE_OTHER): Admitting: Orthopaedic Surgery

## 2024-11-11 VITALS — Ht 70.0 in | Wt 335.0 lb

## 2024-11-11 DIAGNOSIS — G8929 Other chronic pain: Secondary | ICD-10-CM | POA: Diagnosis not present

## 2024-11-11 DIAGNOSIS — M25562 Pain in left knee: Secondary | ICD-10-CM

## 2024-11-11 DIAGNOSIS — M1712 Unilateral primary osteoarthritis, left knee: Secondary | ICD-10-CM | POA: Insufficient documentation

## 2024-11-11 NOTE — Progress Notes (Signed)
 The patient is a 63 year old gentleman well-known to us .  We replaced his right knee back in October 2023 which was 2 years ago.  This was secondary to severe end-stage arthritis.  We had x-rayed his left knee in the past and it showed valgus malalignment with bone-on-bone wear of the lateral compartment of his knee but he was always asymptomatic with his left knee with no issues of that knee over the last 2 years.  It is still remained asymptomatic with no pain and we had documented that even at his visit in June 2024.  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  He is even walking with a limp.  Prior to that he had not had any issues with the left knee and again we had documented no issues with left knee other than an x-ray showing valgus malalignment with near bone-on-bone wear of the lateral compartment of the knee.  He says his right knee replacement is doing well.  He has had no acute changes in medical status.  He does have a high BMI.  He is not a diabetic.  Examination of his left knee shows significant lateral joint line tenderness with some valgus malalignment.  Pain throughout the arc of motion of his left knee and patellofemoral pain as well.  X-rays of the left knee today show complete loss of the lateral joint space with bone-on-bone wear.  There is also patellofemoral arthritic changes and osteophytes in all 3 compartments.  His left knee arthritis is significantly worsened when compared to x-rays from a year and a half ago.  We are recommending a left knee replacement at this point given the severity of his left knee arthritis.  I do feel his acute injury is accelerated of pre-existing arthritic issue and again review of all his notes show no complaints of pain without left knee until this recent injury.  His arthritis is severe enough that we are  recommending a left knee replacement.  Having had this before he is fully aware of the risks and benefits of this type of surgery.  Given the severity of his arthritis now injections would not be really helpful nor with physical therapy.  Certainly weight loss can help some but his knee arthritis is severe bone-on-bone.  We will work on getting this scheduled in the near future and he does agree with this treatment plan.

## 2024-11-25 ENCOUNTER — Other Ambulatory Visit: Payer: Self-pay | Admitting: Physician Assistant

## 2024-11-25 DIAGNOSIS — Z01818 Encounter for other preprocedural examination: Secondary | ICD-10-CM

## 2024-11-25 NOTE — Patient Instructions (Signed)
 SURGICAL WAITING ROOM VISITATION  Patients having surgery or a procedure may have no more than 2 support people in the waiting area - these visitors may rotate.    Children under the age of 104 must have an adult with them who is not the patient.  Visitors with respiratory illnesses are discouraged from visiting and should remain at home.  If the patient needs to stay at the hospital during part of their recovery, the visitor guidelines for inpatient rooms apply. Pre-op nurse will coordinate an appropriate time for 1 support person to accompany patient in pre-op.  This support person may not rotate.    Please refer to the [facility] website for the visitor guidelines for Inpatients (after your surgery is over and you are in a regular room).       Your procedure is scheduled on: 12-11-24   Report to [facility] Main Entrance    Report to admitting at     0845  AM   Call this number if you have problems the morning of surgery [phone]   Do not eat food :After Midnight.   After Midnight you may have the following  CLEAR liquids until _0815_____ AM/  DAY OF SURGERY   THEN NOTHING BY MOUTH  Water Non-Citrus Juices (without pulp, NO RED-Apple, White grape, White cranberry) Black Coffee (NO MILK/CREAM OR CREAMERS, sugar ok)  Clear Tea (NO MILK/CREAM OR CREAMERS, sugar ok) regular and decaf                             Plain Jell-O (NO RED)                                           Fruit ices (not with fruit pulp, NO RED)                                     Popsicles (NO RED)                                                               Sports drinks like Gatorade (NO RED)                    The day of surgery:  Drink ONE (1) Pre-Surgery G2 by 0815   AM the morning of surgery. Drink in one sitting. Do not sip.  This drink was given to you during your hospital  pre-op appointment visit. Nothing else to drink after completing the  Pre-Surgery  G2.          If you have  questions, please contact your surgeon's office.   FOLLOW ANY ADDITIONAL PRE OP INSTRUCTIONS YOU RECEIVED FROM YOUR SURGEON'S OFFICE!!!     Oral Hygiene is also important to reduce your risk of infection.                                    Remember - BRUSH YOUR TEETH THE MORNING OF SURGERY WITH  YOUR REGULAR TOOTHPASTE  DENTURES WILL BE REMOVED PRIOR TO SURGERY PLEASE DO NOT APPLY Poly grip OR ADHESIVES!!!   Do NOT smoke after Midnight   Stop all vitamins and herbal supplements 7 days before surgery.   Take these medicines the morning of surgery with A SIP OF WATER: rosuvastatin , omeprazole     STOP ZEPBOUND  ONE WEEK BEFORE SURGERY   DO NOT TAKE ANY ORAL DIABETIC MEDICATIONS DAY OF YOUR SURGERY  Bring CPAP mask and tubing day of surgery.                              You may not have any metal on your body including hair pins, jewelry, and body piercing             Do not wear , lotions, powders, perfumes/cologne, or deodorant               Men may shave face and neck.   Do not bring valuables to the hospital. [facility] IS NOT             RESPONSIBLE   FOR VALUABLES.   Contacts, glasses, dentures or bridgework may not be worn into surgery.   Bring small overnight bag day of surgery.   DO NOT BRING YOUR HOME MEDICATIONS TO THE HOSPITAL. PHARMACY WILL DISPENSE MEDICATIONS LISTED ON YOUR MEDICATION LIST TO YOU DURING YOUR ADMISSION IN THE HOSPITAL!    Patients discharged on the day of surgery will not be allowed to drive home.  Someone NEEDS to stay with you for the first 24 hours after anesthesia.   Special Instructions: Bring a copy of your healthcare power of attorney and living will documents the day of surgery if you haven't scanned them before.              Please read over the following fact sheets you were given: IF YOU HAVE QUESTIONS ABOUT YOUR PRE-OP INSTRUCTIONS PLEASE CALL [phone].   . If you test positive for Covid or have been in contact with anyone that  has tested positive in the last 10 days please notify you surgeon.      Pre-operative 4 CHG Bath Instructions  DYNA-Hex 4 Chlorhexidine  Gluconate 4% Solution Antiseptic 4 fl. oz   You can play a key role in reducing the risk of infection after surgery. Your skin needs to be as free of germs as possible. You can reduce the number of germs on your skin by washing with CHG (chlorhexidine  gluconate) soap before surgery. CHG is an antiseptic soap that kills germs and continues to kill germs even after washing.   DO NOT use if you have an allergy to chlorhexidine /CHG or antibacterial soaps. If your skin becomes reddened or irritated, stop using the CHG and notify one of our RNs at   Please shower with the CHG soap starting 4 days before surgery using the following schedule:     Please keep in mind the following:  DO NOT shave, including legs and underarms, starting the day of your first shower.   You may shave your face at any point before/day of surgery.  Place clean sheets on your bed the day you start using CHG soap. Use a clean washcloth (not used since being washed) for each shower. DO NOT sleep with pets once you start using the CHG.  CHG Shower Instructions:  If you choose to wash your hair and private area, wash first with your  normal shampoo/soap.  After you use shampoo/soap, rinse your hair and body thoroughly to remove shampoo/soap residue.  Turn the water OFF and apply about 3 tablespoons (45 ml) of CHG soap to a CLEAN washcloth.  Apply CHG soap ONLY FROM YOUR NECK DOWN TO YOUR TOES (washing for 3-5 minutes)  DO NOT use CHG soap on face, private areas, open wounds, or sores.  Pay special attention to the area where your surgery is being performed.  If you are having back surgery, having someone wash your back for you may be helpful. Wait 2 minutes after CHG soap is applied, then you may rinse off the CHG soap.  Pat dry with a clean towel  Put on clean clothes/pajamas   If you  choose to wear lotion, please use ONLY the CHG-compatible lotions on the back of this paper.     Additional instructions for the day of surgery: DO NOT APPLY any lotions, deodorants, cologne, or perfumes.   Put on clean/comfortable clothes.  Brush your teeth.  Ask your nurse before applying any prescription medications to the skin.   CHG Compatible Lotions   Aveeno Moisturizing lotion  Cetaphil Moisturizing Cream  Cetaphil Moisturizing Lotion  Clairol Herbal Essence Moisturizing Lotion, Dry Skin  Clairol Herbal Essence Moisturizing Lotion, Extra Dry Skin  Clairol Herbal Essence Moisturizing Lotion, Normal Skin  Curel Age Defying Therapeutic Moisturizing Lotion with Alpha Hydroxy  Curel Extreme Care Body Lotion  Curel Soothing Hands Moisturizing Hand Lotion  Curel Therapeutic Moisturizing Cream, Fragrance-Free  Curel Therapeutic Moisturizing Lotion, Fragrance-Free  Curel Therapeutic Moisturizing Lotion, Original Formula  Eucerin Daily Replenishing Lotion  Eucerin Dry Skin Therapy Plus Alpha Hydroxy Crme  Eucerin Dry Skin Therapy Plus Alpha Hydroxy Lotion  Eucerin Original Crme  Eucerin Original Lotion  Eucerin Plus Crme Eucerin Plus Lotion  Eucerin TriLipid Replenishing Lotion  Keri Anti-Bacterial Hand Lotion  Keri Deep Conditioning Original Lotion Dry Skin Formula Softly Scented  Keri Deep Conditioning Original Lotion, Fragrance Free Sensitive Skin Formula  Keri Lotion Fast Absorbing Fragrance Free Sensitive Skin Formula  Keri Lotion Fast Absorbing Softly Scented Dry Skin Formula  Keri Original Lotion  Keri Skin Renewal Lotion Keri Silky Smooth Lotion  Keri Silky Smooth Sensitive Skin Lotion  Nivea Body Creamy Conditioning Oil  Nivea Body Extra Enriched Teacher, Adult Education Moisturizing Lotion Nivea Crme  Nivea Skin Firming Lotion  NutraDerm 30 Skin Lotion  NutraDerm Skin Lotion  NutraDerm Therapeutic Skin Cream  NutraDerm Therapeutic  Skin Lotion  ProShield Protective Hand Cream  WHAT IS A BLOOD TRANSFUSION? Blood Transfusion Information  A transfusion is the replacement of blood or some of its parts. Blood is made up of multiple cells which provide different functions. Red blood cells carry oxygen and are used for blood loss replacement. White blood cells fight against infection. Platelets control bleeding. Plasma helps clot blood. Other blood products are available for specialized needs, such as hemophilia or other clotting disorders. BEFORE THE TRANSFUSION  Who gives blood for transfusions?  Healthy volunteers who are fully evaluated to make sure their blood is safe. This is blood bank blood. Transfusion therapy is the safest it has ever been in the practice of medicine. Before blood is taken from a donor, a complete history is taken to make sure that person has no history of diseases nor engages in risky social behavior (examples are intravenous drug use or sexual activity with multiple partners). The donor's travel history is screened to minimize risk  of transmitting infections, such as malaria. The donated blood is tested for signs of infectious diseases, such as HIV and hepatitis. The blood is then tested to be sure it is compatible with you in order to minimize the chance of a transfusion reaction. If you or a relative donates blood, this is often done in anticipation of surgery and is not appropriate for emergency situations. It takes many days to process the donated blood. RISKS AND COMPLICATIONS Although transfusion therapy is very safe and saves many lives, the main dangers of transfusion include:  Getting an infectious disease. Developing a transfusion reaction. This is an allergic reaction to something in the blood you were given. Every precaution is taken to prevent this. The decision to have a blood transfusion has been considered carefully by your caregiver before blood is given. Blood is not given unless the  benefits outweigh the risks. AFTER THE TRANSFUSION Right after receiving a blood transfusion, you will usually feel much better and more energetic. This is especially true if your red blood cells have gotten low (anemic). The transfusion raises the level of the red blood cells which carry oxygen, and this usually causes an energy increase. The nurse administering the transfusion will monitor you carefully for complications. HOME CARE INSTRUCTIONS  No special instructions are needed after a transfusion. You may find your energy is better. Speak with your caregiver about any limitations on activity for underlying diseases you may have. SEEK MEDICAL CARE IF:  Your condition is not improving after your transfusion. You develop redness or irritation at the intravenous (IV) site. SEEK IMMEDIATE MEDICAL CARE IF:  Any of the following symptoms occur over the next 12 hours: Shaking chills. You have a temperature by mouth above 102 F (38.9 C), not controlled by medicine. Chest, back, or muscle pain. People around you feel you are not acting correctly or are confused. Shortness of breath or difficulty breathing. Dizziness and fainting. You get a rash or develop hives. You have a decrease in urine output. Your urine turns a dark color or changes to pink, red, or brown. Any of the following symptoms occur over the next 10 days: You have a temperature by mouth above 102 F (38.9 C), not controlled by medicine. Shortness of breath. Weakness after normal activity. The white part of the eye turns yellow (jaundice). You have a decrease in the amount of urine or are urinating less often. Your urine turns a dark color or changes to pink, red, or brown. Document Released: 12/14/2000 Document Revised: 03/10/2012 Document Reviewed: 08/02/2008 [employer] Patient Information 2014 [place], [place].  _______________________________________________________________________  Incentive Spirometer  An incentive  spirometer is a tool that can help keep your lungs clear and active. This tool measures how well you are filling your lungs with each breath. Taking long deep breaths may help reverse or decrease the chance of developing breathing (pulmonary) problems (especially infection) following: A long period of time when you are unable to move or be active. BEFORE THE PROCEDURE  If the spirometer includes an indicator to show your best effort, your nurse or respiratory therapist will set it to a desired goal. If possible, sit up straight or lean slightly forward. Try not to slouch. Hold the incentive spirometer in an upright position. INSTRUCTIONS FOR USE  Sit on the edge of your bed if possible, or sit up as far as you can in bed or on a chair. Hold the incentive spirometer in an upright position. Breathe out normally. Place the mouthpiece in  your mouth and seal your lips tightly around it. Breathe in slowly and as deeply as possible, raising the piston or the ball toward the top of the column. Hold your breath for 3-5 seconds or for as long as possible. Allow the piston or ball to fall to the bottom of the column. Remove the mouthpiece from your mouth and breathe out normally. Rest for a few seconds and repeat Steps 1 through 7 at least 10 times every 1-2 hours when you are awake. Take your time and take a few normal breaths between deep breaths. The spirometer may include an indicator to show your best effort. Use the indicator as a goal to work toward during each repetition. After each set of 10 deep breaths, practice coughing to be sure your lungs are clear. If you have an incision (the cut made at the time of surgery), support your incision when coughing by placing a pillow or rolled up towels firmly against it. Once you are able to get out of bed, walk around indoors and cough well. You may stop using the incentive spirometer when instructed by your caregiver.  RISKS AND COMPLICATIONS Take your time  so you do not get dizzy or light-headed. If you are in pain, you may need to take or ask for pain medication before doing incentive spirometry. It is harder to take a deep breath if you are having pain. AFTER USE Rest and breathe slowly and easily. It can be helpful to keep track of a log of your progress. Your caregiver can provide you with a simple table to help with this. If you are using the spirometer at home, follow these instructions: SEEK MEDICAL CARE IF:  You are having difficultly using the spirometer. You have trouble using the spirometer as often as instructed. Your pain medication is not giving enough relief while using the spirometer. You develop fever of 100.5 F (38.1 C) or higher. SEEK IMMEDIATE MEDICAL CARE IF:  You cough up bloody sputum that had not been present before. You develop fever of 102 F (38.9 C) or greater. You develop worsening pain at or near the incision site. MAKE SURE YOU:  Understand these instructions. Will watch your condition. Will get help right away if you are not doing well or get worse. Document Released: 04/29/2007 Document Revised: 03/10/2012 Document Reviewed: 06/30/2007 [facility] Patient Information 2014 [place], [place].   ________________________________________________________________________

## 2024-11-25 NOTE — Progress Notes (Signed)
 Sent message, via epic in basket, requesting orders in epic from Careers adviser.

## 2024-11-25 NOTE — Progress Notes (Addendum)
 PCP - Laine Balls, MD LOV 06-04-24 epic Cardiologist - n/a  PPM/ICD -  Device Orders -  Rep Notified -   Chest x-ray -  EKG - preop Stress Test -  ECHO -  Cardiac Cath -   Sleep Study - yes CPAP - yes  Fasting Blood Sugar -  Checks Blood Sugar __0___ times a day  Blood Thinner Instructions:n/a Aspirin  Instructions:n/a  ERAS Protcol - PRE-SURGERYG2-   ZEPbound  last dose 12-01-24 COVID vaccine -  Activity--Able to complete a flight of stairs with no CP or SOB Anesthesia review: OSA, HTN  Patient denies shortness of breath, fever, cough and chest pain at PAT appointment   All instructions explained to the patient, with a verbal understanding of the material. Patient agrees to go over the instructions while at home for a better understanding. Patient also instructed to self quarantine after being tested for COVID-19. The opportunity to ask questions was provided.

## 2024-11-30 ENCOUNTER — Encounter (HOSPITAL_COMMUNITY)
Admission: RE | Admit: 2024-11-30 | Discharge: 2024-11-30 | Disposition: A | Source: Ambulatory Visit | Attending: Orthopaedic Surgery

## 2024-11-30 ENCOUNTER — Encounter (HOSPITAL_COMMUNITY): Payer: Self-pay

## 2024-11-30 ENCOUNTER — Other Ambulatory Visit: Payer: Self-pay

## 2024-11-30 VITALS — Ht 70.0 in | Wt 315.0 lb

## 2024-11-30 DIAGNOSIS — I10 Essential (primary) hypertension: Secondary | ICD-10-CM | POA: Insufficient documentation

## 2024-11-30 DIAGNOSIS — Z01818 Encounter for other preprocedural examination: Secondary | ICD-10-CM | POA: Insufficient documentation

## 2024-11-30 LAB — SURGICAL PCR SCREEN
MRSA, PCR: NEGATIVE
Staphylococcus aureus: NEGATIVE

## 2024-11-30 LAB — BASIC METABOLIC PANEL WITH GFR
Anion gap: 11 (ref 5–15)
BUN: 16 mg/dL (ref 8–23)
CO2: 25 mmol/L (ref 22–32)
Calcium: 9.8 mg/dL (ref 8.9–10.3)
Chloride: 103 mmol/L (ref 98–111)
Creatinine, Ser: 0.81 mg/dL (ref 0.61–1.24)
GFR, Estimated: >60 mL/min (ref >60)
Glucose, Bld: 91 mg/dL (ref 70–99)
Potassium: 3.6 mmol/L (ref 3.5–5.1)
Sodium: 139 mmol/L (ref 135–145)

## 2024-11-30 LAB — TYPE AND SCREEN
ABO/RH(D): A POS
Antibody Screen: NEGATIVE

## 2024-11-30 LAB — CBC
HCT: 46.8 % (ref 39.0–52.0)
Hemoglobin: 15.7 g/dL (ref 13.0–17.0)
MCH: 30.5 pg (ref 26.0–34.0)
MCHC: 33.5 g/dL (ref 30.0–36.0)
MCV: 90.9 fL (ref 80.0–100.0)
Platelets: 180 K/uL (ref 150–400)
RBC: 5.15 MIL/uL (ref 4.22–5.81)
RDW: 14.9 % (ref 11.5–15.5)
WBC: 7.1 K/uL (ref 4.0–10.5)
nRBC: 0 % (ref 0.0–0.2)

## 2024-12-01 NOTE — Progress Notes (Signed)
 Anesthesia review for 1st degree AV block on EKG

## 2024-12-10 NOTE — H&P (Signed)
 TOTAL KNEE ADMISSION H&P  Patient is being admitted for left total knee arthroplasty.  Subjective:  Chief Complaint:left knee pain.  HPI: Charles Morales, 63 y.o. male, has a history of pain and functional disability in the left knee due to arthritis and has failed non-surgical conservative treatments for greater than 12 weeks to includeNSAID's and/or analgesics, corticosteriod injections, flexibility and strengthening excercises, and activity modification.  Onset of symptoms was abrupt, starting less than a year ago with rapidlly worsening course since that time. The patient noted previous arthroscopic surgery on the left knee(s).  Patient currently rates pain in the left knee(s) at 10 out of 10 with activity. Patient has night pain, worsening of pain with activity and weight bearing, pain that interferes with activities of daily living, pain with passive range of motion, crepitus, and joint swelling.  Patient has evidence of subchondral sclerosis, periarticular osteophytes, and joint space narrowing by imaging studies. There is no active infection.  Patient Active Problem List   Diagnosis Date Noted   Unilateral primary osteoarthritis, left knee 11/11/2024   Right kidney mass 03/02/2024   Pyelonephritis 03/02/2024   Urinary urgency 03/02/2024   Vitamin D  deficiency 12/21/2023   Stress reaction 12/20/2023   Encounter for physical examination related to employment 10/02/2023   Status post total right knee replacement 10/12/2022   Current use of proton pump inhibitor 11/20/2021   Genetic testing 09/15/2018   Family history of genetic disease carrier 09/02/2018   Family history of breast cancer    Family history of pancreatic cancer    Prostate cancer screening 08/17/2018   History of colonic polyps    Prediabetes 04/01/2016   Left ankle pain 10/14/2015   PVC's (premature ventricular contractions) 10/22/2013   Colon cancer screening 05/23/2012   Routine general medical examination at a  health care facility 05/20/2012   Right knee pain 06/08/2011   G E R D 08/04/2007   Hyperlipidemia 07/22/2007   Morbid obesity (HCC) 07/22/2007   SMOKELESS TOBACCO ABUSE 07/22/2007   Hypertension, essential 07/22/2007   OSA (obstructive sleep apnea) 07/22/2007   Past Medical History:  Diagnosis Date   Allergy    Arthritis    right knee   Esophageal reflux    Family history of breast cancer    Family history of genetic disease carrier    sister NBN pathogenic variant   Family history of pancreatic cancer    Fatty liver    HLD (hyperlipidemia)    HTN (hypertension)    Morbid obesity (HCC)    Pre-diabetes    Sleep apnea    USES CPAP    Tobacco use disorder    Unspecified sleep apnea     Past Surgical History:  Procedure Laterality Date   COLONOSCOPY  2013   COLONOSCOPY WITH PROPOFOL  N/A 05/20/2018   Procedure: COLONOSCOPY WITH PROPOFOL ;  Surgeon: Albertus Gordy HERO, MD;  Location: [facility];  Service: Gastroenterology;  Laterality: N/A;   KNEE ARTHROSCOPY     left   TOTAL KNEE ARTHROPLASTY Right 10/12/2022   Procedure: RIGHT TOTAL KNEE ARTHROPLASTY;  Surgeon: Vernetta Lonni GRADE, MD;  Location: [facility];  Service: Orthopedics;  Laterality: Right;    No current facility-administered medications for this encounter.   Current Outpatient Medications  Medication Sig Dispense Refill Last Dose/Taking   acetaminophen  (TYLENOL ) 650 MG CR tablet Take 1,300 mg by mouth in the morning and at bedtime.   Taking   Cyanocobalamin  (VITAMIN B-12 PO) Take 500 mcg by mouth in the morning.  Taking   diphenhydrAMINE  (BENADRYL ) 25 mg capsule Take 25 mg by mouth in the morning.   Taking   FLUoxetine  (PROZAC ) 20 MG capsule Take 1 capsule (20 mg total) by mouth daily. (Patient taking differently: Take 20 mg by mouth every evening.) 90 capsule 3 Taking Differently   GLUCOSAMINE-CHONDROITIN PO Take 1 tablet by mouth 2 (two) times daily.   Taking   lisinopril -hydrochlorothiazide  (ZESTORETIC ) 20-25  MG tablet Take 1 tablet by mouth daily. (Patient taking differently: Take 1 tablet by mouth daily in the afternoon.) 90 tablet 3 Taking Differently   Melatonin 10 MG TABS Take 20 mg by mouth at bedtime as needed (sleep).   Taking As Needed   NON FORMULARY CPAP at night as directed    Taking   Omega-3 Fatty Acids (FISH OIL) 1200 MG CPDR Take 2,400 mg by mouth 2 (two) times daily.   Taking   omeprazole  (PRILOSEC) 40 MG capsule Take 1 capsule (40 mg total) by mouth daily. (Patient taking differently: Take 40 mg by mouth daily before breakfast.) 90 capsule 3 Taking Differently   rosuvastatin  (CRESTOR ) 5 MG tablet Take 1 tablet (5 mg total) by mouth daily. (Patient taking differently: Take 5 mg by mouth daily in the afternoon.) 90 tablet 3 Taking Differently   tirzepatide  (ZEPBOUND ) 15 MG/0.5ML Pen INJECT 15 MG SUBCUTANEOUSLY ONCE A WEEK (Patient taking differently: Inject 15 mg into the skin every Tuesday.) 2 mL 3 Taking Differently   VITAMIN D  PO Take 1 tablet by mouth in the morning.   Taking   Allergies[1]  Social History   Tobacco Use   Smoking status: Former    Current packs/day: 0.00    Average packs/day: 1 pack/day for 4.0 years (4.0 ttl pk-yrs)    Types: Cigarettes    Start date: 50    Quit date: 59    Years since quitting: 32.9   Smokeless tobacco: Current    Types: Snuff  Substance Use Topics   Alcohol use: Not Currently    Comment: rare    Family History  Problem Relation Age of Onset   Hypertension Mother    Heart attack Mother        deceased   Heart disease Mother    Kidney disease Mother    Arthritis Mother    Heart attack Father 8       deceased   Heart disease Father    Breast cancer Sister 76       NBN +   Lung cancer Brother    Kidney disease Maternal Grandfather    Colon cancer Neg Hx    Stomach cancer Neg Hx    Esophageal cancer Neg Hx    Rectal cancer Neg Hx      Review of Systems  Objective:  Physical Exam Vitals reviewed.  Constitutional:       Appearance: Normal appearance.  HENT:     Head: Normocephalic and atraumatic.  Eyes:     Extraocular Movements: Extraocular movements intact.     Pupils: Pupils are equal, round, and reactive to light.  Cardiovascular:     Rate and Rhythm: Normal rate.  Pulmonary:     Effort: Pulmonary effort is normal.     Breath sounds: Normal breath sounds.  Abdominal:     Palpations: Abdomen is soft.  Musculoskeletal:     Cervical back: Normal range of motion and neck supple.     Left knee: Effusion, bony tenderness and crepitus present. Decreased range of motion. Tenderness present  over the medial joint line and lateral joint line. Abnormal alignment and abnormal meniscus.  Neurological:     Mental Status: He is alert and oriented to person, place, and time.  Psychiatric:        Behavior: Behavior normal.     Vital signs in last 24 hours:    Labs:   Estimated body mass index is 45.2 kg/m as calculated from the following:   Height as of 11/30/24: 5' 10 (1.778 m).   Weight as of 11/30/24: 142.9 kg.   Imaging Review Plain radiographs demonstrate severe degenerative joint disease of the left knee(s). The overall alignment ismild valgus. The bone quality appears to be excellent for age and reported activity level.      Assessment/Plan:  End stage arthritis, left knee   The patient history, physical examination, clinical judgment of the provider and imaging studies are consistent with end stage degenerative joint disease of the left knee(s) and total knee arthroplasty is deemed medically necessary. The treatment options including medical management, injection therapy arthroscopy and arthroplasty were discussed at length. The risks and benefits of total knee arthroplasty were presented and reviewed. The risks due to aseptic loosening, infection, stiffness, patella tracking problems, thromboembolic complications and other imponderables were discussed. The patient acknowledged the  explanation, agreed to proceed with the plan and consent was signed. Patient is being admitted for inpatient treatment for surgery, pain control, PT, OT, prophylactic antibiotics, VTE prophylaxis, progressive ambulation and ADL's and discharge planning. The patient is planning to be discharged home with home health services        [1] No Known Allergies

## 2024-12-11 ENCOUNTER — Encounter (HOSPITAL_COMMUNITY): Admission: RE | Disposition: A | Payer: Self-pay | Source: Ambulatory Visit | Attending: Orthopaedic Surgery

## 2024-12-11 ENCOUNTER — Observation Stay (HOSPITAL_COMMUNITY)

## 2024-12-11 ENCOUNTER — Other Ambulatory Visit: Payer: Self-pay

## 2024-12-11 ENCOUNTER — Observation Stay (HOSPITAL_COMMUNITY)
Admission: RE | Admit: 2024-12-11 | Discharge: 2024-12-12 | Disposition: A | Source: Ambulatory Visit | Attending: Orthopaedic Surgery | Admitting: Orthopaedic Surgery

## 2024-12-11 ENCOUNTER — Encounter (HOSPITAL_COMMUNITY): Payer: Self-pay | Admitting: Orthopaedic Surgery

## 2024-12-11 ENCOUNTER — Ambulatory Visit (HOSPITAL_COMMUNITY): Admitting: Anesthesiology

## 2024-12-11 ENCOUNTER — Encounter (HOSPITAL_COMMUNITY): Payer: Self-pay | Admitting: Medical

## 2024-12-11 DIAGNOSIS — Z96651 Presence of right artificial knee joint: Secondary | ICD-10-CM | POA: Diagnosis not present

## 2024-12-11 DIAGNOSIS — Z23 Encounter for immunization: Secondary | ICD-10-CM | POA: Diagnosis not present

## 2024-12-11 DIAGNOSIS — Z79899 Other long term (current) drug therapy: Secondary | ICD-10-CM | POA: Diagnosis not present

## 2024-12-11 DIAGNOSIS — Z87891 Personal history of nicotine dependence: Secondary | ICD-10-CM | POA: Insufficient documentation

## 2024-12-11 DIAGNOSIS — G4733 Obstructive sleep apnea (adult) (pediatric): Secondary | ICD-10-CM

## 2024-12-11 DIAGNOSIS — M1712 Unilateral primary osteoarthritis, left knee: Principal | ICD-10-CM | POA: Diagnosis present

## 2024-12-11 DIAGNOSIS — Z96652 Presence of left artificial knee joint: Secondary | ICD-10-CM

## 2024-12-11 DIAGNOSIS — I10 Essential (primary) hypertension: Secondary | ICD-10-CM | POA: Insufficient documentation

## 2024-12-11 LAB — ABO/RH: ABO/RH(D): A POS

## 2024-12-11 SURGERY — ARTHROPLASTY, KNEE, TOTAL
Anesthesia: Spinal | Site: Knee | Laterality: Left

## 2024-12-11 MED ORDER — METOCLOPRAMIDE HCL 5 MG PO TABS: 5.0000 mg | ORAL_TABLET | Freq: Three times a day (TID) | ORAL | Status: DC | PRN

## 2024-12-11 MED ORDER — TRANEXAMIC ACID-NACL 1000-0.7 MG/100ML-% IV SOLN
1000.0000 mg | INTRAVENOUS | Status: AC
  Administered 2024-12-11: 1000 mg via INTRAVENOUS
  Filled 2024-12-11: qty 100

## 2024-12-11 MED ORDER — ALUM & MAG HYDROXIDE-SIMETH 200-200-20 MG/5ML PO SUSP: 30.0000 mL | ORAL | Status: DC | PRN

## 2024-12-11 MED ORDER — PHENYLEPHRINE HCL-NACL 20-0.9 MG/250ML-% IV SOLN
INTRAVENOUS | Status: DC | PRN
  Administered 2024-12-11: 50 ug/min via INTRAVENOUS

## 2024-12-11 MED ORDER — PROPOFOL 10 MG/ML IV BOLUS
INTRAVENOUS | Status: AC
  Filled 2024-12-11: qty 20

## 2024-12-11 MED ORDER — LISINOPRIL-HYDROCHLOROTHIAZIDE 20-25 MG PO TABS: 1.0000 | ORAL_TABLET | Freq: Every day | ORAL | Status: DC

## 2024-12-11 MED ORDER — LIDOCAINE HCL (PF) 2 % IJ SOLN
INTRAMUSCULAR | Status: AC
  Filled 2024-12-11: qty 5

## 2024-12-11 MED ORDER — CEFAZOLIN SODIUM-DEXTROSE 2-4 GM/100ML-% IV SOLN
2.0000 g | Freq: Four times a day (QID) | INTRAVENOUS | Status: AC
  Administered 2024-12-11 (×2): 2 g via INTRAVENOUS
  Filled 2024-12-11 (×2): qty 100

## 2024-12-11 MED ORDER — ORAL CARE MOUTH RINSE: 15.0000 mL | Freq: Once | OROMUCOSAL | Status: AC

## 2024-12-11 MED ORDER — PANTOPRAZOLE SODIUM 40 MG PO TBEC
40.0000 mg | DELAYED_RELEASE_TABLET | Freq: Every day | ORAL | Status: DC
  Administered 2024-12-11 – 2024-12-12 (×2): 40 mg via ORAL
  Filled 2024-12-11 (×2): qty 1

## 2024-12-11 MED ORDER — STERILE WATER FOR IRRIGATION IR SOLN
Status: DC | PRN
  Administered 2024-12-11: 2000 mL

## 2024-12-11 MED ORDER — HYDROCHLOROTHIAZIDE 25 MG PO TABS
25.0000 mg | ORAL_TABLET | Freq: Every day | ORAL | Status: DC
  Filled 2024-12-11: qty 1

## 2024-12-11 MED ORDER — MENTHOL 3 MG MT LOZG: 1.0000 | LOZENGE | OROMUCOSAL | Status: DC | PRN

## 2024-12-11 MED ORDER — ATROPINE SULFATE 0.4 MG/ML IV SOLN
INTRAVENOUS | Status: DC | PRN
  Administered 2024-12-11: .2 mg via INTRAVENOUS

## 2024-12-11 MED ORDER — DROPERIDOL 2.5 MG/ML IJ SOLN: 0.6250 mg | Freq: Once | INTRAMUSCULAR | Status: DC | PRN

## 2024-12-11 MED ORDER — FLUOXETINE HCL 20 MG PO CAPS
20.0000 mg | ORAL_CAPSULE | Freq: Every day | ORAL | Status: DC
  Administered 2024-12-11: 20 mg via ORAL
  Filled 2024-12-11: qty 1

## 2024-12-11 MED ORDER — EPHEDRINE 5 MG/ML INJ
INTRAVENOUS | Status: AC
  Filled 2024-12-11: qty 5

## 2024-12-11 MED ORDER — LACTATED RINGERS IV SOLN: INTRAVENOUS | Status: DC

## 2024-12-11 MED ORDER — CHLORHEXIDINE GLUCONATE 0.12 % MT SOLN
15.0000 mL | Freq: Once | OROMUCOSAL | Status: AC
  Administered 2024-12-11: 15 mL via OROMUCOSAL

## 2024-12-11 MED ORDER — EPHEDRINE SULFATE (PRESSORS) 25 MG/5ML IV SOSY
PREFILLED_SYRINGE | INTRAVENOUS | Status: DC | PRN
  Administered 2024-12-11 (×2): 5 mg via INTRAVENOUS

## 2024-12-11 MED ORDER — BUPIVACAINE-EPINEPHRINE (PF) 0.25% -1:200000 IJ SOLN
INTRAMUSCULAR | Status: AC
  Filled 2024-12-11: qty 30

## 2024-12-11 MED ORDER — OXYCODONE HCL 5 MG PO TABS
5.0000 mg | ORAL_TABLET | ORAL | Status: DC | PRN
  Administered 2024-12-12: 10 mg via ORAL

## 2024-12-11 MED ORDER — DIPHENHYDRAMINE HCL 12.5 MG/5ML PO ELIX: 12.5000 mg | ORAL_SOLUTION | ORAL | Status: DC | PRN

## 2024-12-11 MED ORDER — HYDROMORPHONE HCL 1 MG/ML IJ SOLN
0.5000 mg | INTRAMUSCULAR | Status: DC | PRN
  Administered 2024-12-11 (×3): 1 mg via INTRAVENOUS
  Filled 2024-12-11 (×3): qty 1

## 2024-12-11 MED ORDER — ZOLPIDEM TARTRATE 5 MG PO TABS: 5.0000 mg | ORAL_TABLET | Freq: Every evening | ORAL | Status: DC | PRN

## 2024-12-11 MED ORDER — PHENOL 1.4 % MT LIQD: 1.0000 | OROMUCOSAL | Status: DC | PRN

## 2024-12-11 MED ORDER — POVIDONE-IODINE 10 % EX SWAB
2.0000 | Freq: Once | CUTANEOUS | Status: AC
  Administered 2024-12-11: 2 via TOPICAL

## 2024-12-11 MED ORDER — FENTANYL CITRATE (PF) 100 MCG/2ML IJ SOLN
INTRAMUSCULAR | Status: DC | PRN
  Administered 2024-12-11 (×2): 50 ug via INTRAVENOUS

## 2024-12-11 MED ORDER — TIZANIDINE HCL 4 MG PO TABS
4.0000 mg | ORAL_TABLET | Freq: Four times a day (QID) | ORAL | Status: DC | PRN
  Administered 2024-12-11 – 2024-12-12 (×2): 4 mg via ORAL
  Filled 2024-12-11 (×2): qty 1

## 2024-12-11 MED ORDER — ACETAMINOPHEN 325 MG PO TABS
325.0000 mg | ORAL_TABLET | Freq: Four times a day (QID) | ORAL | Status: DC | PRN
  Filled 2024-12-11: qty 2

## 2024-12-11 MED ORDER — MIDAZOLAM HCL (PF) 2 MG/2ML IJ SOLN
INTRAMUSCULAR | Status: DC | PRN
  Administered 2024-12-11 (×2): 1 mg via INTRAVENOUS

## 2024-12-11 MED ORDER — BUPIVACAINE IN DEXTROSE 0.75-8.25 % IT SOLN
INTRATHECAL | Status: DC | PRN
  Administered 2024-12-11: 1.6 mL via INTRATHECAL

## 2024-12-11 MED ORDER — FENTANYL CITRATE (PF) 100 MCG/2ML IJ SOLN
INTRAMUSCULAR | Status: AC
  Filled 2024-12-11: qty 2

## 2024-12-11 MED ORDER — ONDANSETRON HCL 4 MG PO TABS: 4.0000 mg | ORAL_TABLET | Freq: Four times a day (QID) | ORAL | Status: DC | PRN

## 2024-12-11 MED ORDER — FENTANYL CITRATE (PF) 50 MCG/ML IJ SOSY
25.0000 ug | PREFILLED_SYRINGE | INTRAMUSCULAR | Status: DC | PRN
  Administered 2024-12-11: 50 ug via INTRAVENOUS

## 2024-12-11 MED ORDER — ONDANSETRON HCL 4 MG/2ML IJ SOLN
INTRAMUSCULAR | Status: DC | PRN
  Administered 2024-12-11: 4 mg via INTRAVENOUS

## 2024-12-11 MED ORDER — ROPIVACAINE HCL 5 MG/ML IJ SOLN
INTRAMUSCULAR | Status: DC | PRN
  Administered 2024-12-11: 20 mL via PERINEURAL

## 2024-12-11 MED ORDER — FENTANYL CITRATE (PF) 50 MCG/ML IJ SOSY
PREFILLED_SYRINGE | INTRAMUSCULAR | Status: AC
  Filled 2024-12-11: qty 1

## 2024-12-11 MED ORDER — DOCUSATE SODIUM 100 MG PO CAPS
100.0000 mg | ORAL_CAPSULE | Freq: Two times a day (BID) | ORAL | Status: DC
  Administered 2024-12-11 – 2024-12-12 (×2): 100 mg via ORAL
  Filled 2024-12-11 (×2): qty 1

## 2024-12-11 MED ORDER — PROPOFOL 1000 MG/100ML IV EMUL
INTRAVENOUS | Status: AC
  Filled 2024-12-11: qty 100

## 2024-12-11 MED ORDER — MIDAZOLAM HCL 2 MG/2ML IJ SOLN
INTRAMUSCULAR | Status: AC
  Filled 2024-12-11: qty 2

## 2024-12-11 MED ORDER — CEFAZOLIN SODIUM-DEXTROSE 3-4 GM/150ML-% IV SOLN
3.0000 g | INTRAVENOUS | Status: AC
  Administered 2024-12-11: 3 g via INTRAVENOUS
  Filled 2024-12-11: qty 150

## 2024-12-11 MED ORDER — OXYCODONE HCL 5 MG PO TABS
10.0000 mg | ORAL_TABLET | ORAL | Status: DC | PRN
  Administered 2024-12-11: 15 mg via ORAL
  Administered 2024-12-11: 10 mg via ORAL
  Administered 2024-12-11 – 2024-12-12 (×3): 15 mg via ORAL
  Filled 2024-12-11 (×2): qty 3
  Filled 2024-12-11: qty 2
  Filled 2024-12-11 (×3): qty 3

## 2024-12-11 MED ORDER — SODIUM CHLORIDE 0.9 % IV SOLN: INTRAVENOUS | Status: AC

## 2024-12-11 MED ORDER — PROPOFOL 10 MG/ML IV BOLUS
INTRAVENOUS | Status: DC | PRN
  Administered 2024-12-11 (×2): 20 mg via INTRAVENOUS
  Administered 2024-12-11: 10 mg via INTRAVENOUS

## 2024-12-11 MED ORDER — ONDANSETRON HCL 4 MG/2ML IJ SOLN
INTRAMUSCULAR | Status: AC
  Filled 2024-12-11: qty 2

## 2024-12-11 MED ORDER — ASPIRIN 81 MG PO CHEW
81.0000 mg | CHEWABLE_TABLET | Freq: Two times a day (BID) | ORAL | Status: DC
  Administered 2024-12-11 – 2024-12-12 (×2): 81 mg via ORAL
  Filled 2024-12-11 (×2): qty 1

## 2024-12-11 MED ORDER — ONDANSETRON HCL 4 MG/2ML IJ SOLN: 4.0000 mg | Freq: Four times a day (QID) | INTRAMUSCULAR | Status: DC | PRN

## 2024-12-11 MED ORDER — CLONIDINE HCL (ANALGESIA) 100 MCG/ML EP SOLN
EPIDURAL | Status: DC | PRN
  Administered 2024-12-11: 50 ug

## 2024-12-11 MED ORDER — SODIUM CHLORIDE 0.9 % IR SOLN
Status: DC | PRN
  Administered 2024-12-11: 1000 mL

## 2024-12-11 MED ORDER — OXYCODONE HCL 5 MG PO TABS
ORAL_TABLET | ORAL | Status: AC
  Filled 2024-12-11: qty 1

## 2024-12-11 MED ORDER — BUPIVACAINE-EPINEPHRINE 0.25% -1:200000 IJ SOLN
INTRAMUSCULAR | Status: DC | PRN
  Administered 2024-12-11: 30 mL

## 2024-12-11 MED ORDER — METOCLOPRAMIDE HCL 5 MG/ML IJ SOLN: 5.0000 mg | Freq: Three times a day (TID) | INTRAMUSCULAR | Status: DC | PRN

## 2024-12-11 MED ORDER — DEXAMETHASONE SOD PHOSPHATE PF 10 MG/ML IJ SOLN
INTRAMUSCULAR | Status: DC | PRN
  Administered 2024-12-11: 10 mg via INTRAVENOUS

## 2024-12-11 MED ORDER — OXYCODONE HCL 5 MG PO TABS
5.0000 mg | ORAL_TABLET | Freq: Once | ORAL | Status: AC | PRN
  Administered 2024-12-11: 5 mg via ORAL

## 2024-12-11 MED ORDER — INFLUENZA VIRUS VACC SPLIT PF (FLUZONE) 0.5 ML IM SUSY
0.5000 mL | PREFILLED_SYRINGE | INTRAMUSCULAR | Status: AC
  Administered 2024-12-12: 0.5 mL via INTRAMUSCULAR
  Filled 2024-12-11: qty 0.5

## 2024-12-11 MED ORDER — PROPOFOL 500 MG/50ML IV EMUL
INTRAVENOUS | Status: DC | PRN
  Administered 2024-12-11: 50 ug/kg/min via INTRAVENOUS

## 2024-12-11 MED ORDER — ACETAMINOPHEN 500 MG PO TABS
1000.0000 mg | ORAL_TABLET | Freq: Once | ORAL | Status: AC
  Administered 2024-12-11: 1000 mg via ORAL
  Filled 2024-12-11: qty 2

## 2024-12-11 MED ORDER — LISINOPRIL 20 MG PO TABS
20.0000 mg | ORAL_TABLET | Freq: Every day | ORAL | Status: DC
  Filled 2024-12-11: qty 1

## 2024-12-11 MED ORDER — OXYCODONE HCL 5 MG/5ML PO SOLN: 5.0000 mg | Freq: Once | ORAL | Status: AC | PRN

## 2024-12-11 MED ORDER — POLYETHYLENE GLYCOL 3350 17 G PO PACK: 17.0000 g | PACK | Freq: Every day | ORAL | Status: DC | PRN

## 2024-12-11 MED ADMIN — Sodium Chloride Irrigation Soln 0.9%: 1000 mL | NDC 99999050048

## 2024-12-11 MED FILL — Rosuvastatin Calcium Tab 5 MG: 5.0000 mg | ORAL | Qty: 1 | Status: CN

## 2024-12-11 SURGICAL SUPPLY — 50 items
BAG COUNTER SPONGE SURGICOUNT (BAG) IMPLANT
BAG ZIPLOCK 12X15 (MISCELLANEOUS) ×1 IMPLANT
BENZOIN TINCTURE PRP APPL 2/3 (GAUZE/BANDAGES/DRESSINGS) IMPLANT
BLADE SAG 18X100X1.27 (BLADE) ×1 IMPLANT
BNDG ELASTIC 6INX 5YD STR LF (GAUZE/BANDAGES/DRESSINGS) ×2 IMPLANT
BOWL SMART MIX CTS (DISPOSABLE) IMPLANT
COMPONENT FEM KNEE STD PS 7 LT (Joint) IMPLANT
COMPONENT PATELLA 3 PEG 35 (Joint) IMPLANT
COOLER ICEMAN CLASSIC (MISCELLANEOUS) ×1 IMPLANT
COVER SURGICAL LIGHT HANDLE (MISCELLANEOUS) ×1 IMPLANT
CUFF TRNQT CYL 34X4.125X (TOURNIQUET CUFF) ×1 IMPLANT
DRAPE U-SHAPE 47X51 STRL (DRAPES) ×1 IMPLANT
DURAPREP 26ML APPLICATOR (WOUND CARE) ×1 IMPLANT
ELECT BLADE TIP CTD 4 INCH (ELECTRODE) ×1 IMPLANT
ELECT PENCIL ROCKER SW 15FT (MISCELLANEOUS) ×1 IMPLANT
ELECT REM PT RETURN 15FT ADLT (MISCELLANEOUS) ×1 IMPLANT
GAUZE PAD ABD 8X10 STRL (GAUZE/BANDAGES/DRESSINGS) ×2 IMPLANT
GAUZE SPONGE 4X4 12PLY STRL (GAUZE/BANDAGES/DRESSINGS) ×1 IMPLANT
GAUZE XEROFORM 1X8 LF (GAUZE/BANDAGES/DRESSINGS) IMPLANT
GLOVE BIO SURGEON STRL SZ7.5 (GLOVE) ×1 IMPLANT
GLOVE BIOGEL PI IND STRL 8 (GLOVE) ×2 IMPLANT
GLOVE SURG ORTHO 8.0 STRL STRW (GLOVE) ×1 IMPLANT
GOWN STRL REUS W/ TWL XL LVL3 (GOWN DISPOSABLE) ×2 IMPLANT
HOLDER FOLEY CATH W/STRAP (MISCELLANEOUS) IMPLANT
IMMOBILIZER KNEE 20 THIGH 36 (SOFTGOODS) ×1 IMPLANT
KIT TURNOVER KIT A (KITS) ×1 IMPLANT
MANIFOLD NEPTUNE II (INSTRUMENTS) ×1 IMPLANT
NS IRRIG 1000ML POUR BTL (IV SOLUTION) ×1 IMPLANT
PACK TOTAL KNEE CUSTOM (KITS) ×1 IMPLANT
PAD COLD SHLDR WRAP-ON (PAD) ×1 IMPLANT
PADDING CAST COTTON 6X4 STRL (CAST SUPPLIES) ×2 IMPLANT
PIN DRILL HDLS TROCAR 75 4PK (PIN) IMPLANT
PROTECTOR NERVE ULNAR (MISCELLANEOUS) ×1 IMPLANT
SCREW FEMALE HEX FIX 25X2.5 (ORTHOPEDIC DISPOSABLE SUPPLIES) IMPLANT
SET HNDPC FAN SPRY TIP SCT (DISPOSABLE) ×1 IMPLANT
SET PAD KNEE POSITIONER (MISCELLANEOUS) ×1 IMPLANT
SPIKE FLUID TRANSFER (MISCELLANEOUS) IMPLANT
STAPLER SKIN PROX 35W (STAPLE) IMPLANT
STEM TIB PERS SZ E 5D LT (Screw) IMPLANT
STEM TIBIAL SZ6-7 E-F10 (Stem) IMPLANT
STEM TIBIAL SZ6-7 EF 12 LT (Knees) IMPLANT
STRIP CLOSURE SKIN 1/2X4 (GAUZE/BANDAGES/DRESSINGS) IMPLANT
SUT MNCRL AB 4-0 PS2 18 (SUTURE) IMPLANT
SUT VIC AB 0 CT1 36 (SUTURE) ×1 IMPLANT
SUT VIC AB 1 CT1 36 (SUTURE) ×2 IMPLANT
SUT VIC AB 2-0 CT1 TAPERPNT 27 (SUTURE) ×2 IMPLANT
TOWEL GREEN STERILE FF (TOWEL DISPOSABLE) ×1 IMPLANT
TRAY FOLEY MTR SLVR 16FR STAT (SET/KITS/TRAYS/PACK) IMPLANT
WATER STERILE IRR 1000ML POUR (IV SOLUTION) ×2 IMPLANT
YANKAUER SUCT BULB TIP NO VENT (SUCTIONS) ×1 IMPLANT

## 2024-12-11 NOTE — Evaluation (Signed)
 Physical Therapy Evaluation Patient Details Name: Charles Morales MRN: [id number] DOB: 06-15-1961 Today's Date: 12/11/2024  History of Present Illness  Pt s/p L TKR and with hx of R TKR in 2023  Clinical Impression  Pt s/p L TKR and presents with decreased L LE strength/ROM and post op pain limiting functional mobility.  Pt should progress to dc home with family assist and reports planned HHPT follow up.        If plan is discharge home, recommend the following:     Can travel by private vehicle        Equipment Recommendations None recommended by PT  Recommendations for Other Services       Functional Status Assessment Patient has had a recent decline in their functional status and demonstrates the ability to make significant improvements in function in a reasonable and predictable amount of time.     Precautions / Restrictions Precautions Precautions: Fall;Knee Required Braces or Orthoses: Knee Immobilizer - Left Knee Immobilizer - Left: Discontinue once straight leg raise with < 10 degree lag Restrictions Weight Bearing Restrictions Per Provider Order: Yes LLE Weight Bearing Per Provider Order: Weight bearing as tolerated      Mobility  Bed Mobility Overal bed mobility: Needs Assistance Bed Mobility: Supine to Sit     Supine to sit: Min assist     General bed mobility comments: Increased time with cues for sequence and use of R LE to self assistq    Transfers Overall transfer level: Needs assistance Equipment used: Rolling walker (2 wheels) Transfers: Sit to/from Stand Sit to Stand: From elevated surface, Min assist, +2 safety/equipment           General transfer comment: cues for LE management and use of UEs to self assist    Ambulation/Gait Ambulation/Gait assistance: Min assist Gait Distance (Feet): 43 Feet Assistive device: Rolling walker (2 wheels) Gait Pattern/deviations: Step-to pattern, Decreased step length - right, Decreased step length -  left, Shuffle, Trunk flexed Gait velocity: decr     General Gait Details: cues for sequence, posture and position from [employer]            Wheelchair Mobility     Tilt Bed    Modified Rankin (Stroke Patients Only)       Balance Overall balance assessment: Needs assistance Sitting-balance support: No upper extremity supported Sitting balance-Leahy Scale: Good     Standing balance support: Bilateral upper extremity supported Standing balance-Leahy Scale: Poor                               Pertinent Vitals/Pain Pain Assessment Pain Assessment: 0-10 Pain Score: 8  Pain Location: L knee with activity Pain Descriptors / Indicators: Aching, Sore Pain Intervention(s): Limited activity within patient's tolerance, Monitored during session, Premedicated before session, Patient requesting pain meds-RN notified (pt declines ice at this time)    Home Living Family/patient expects to be discharged to:: Private residence Living Arrangements: Spouse/significant other Available Help at Discharge: Family;Available 24 hours/day Type of Home: House Home Access: Stairs to enter Entrance Stairs-Rails: Right Entrance Stairs-Number of Steps: 4 Alternate Level Stairs-Number of Steps: ramp vs 1 step Home Layout: Two level Home Equipment: None      Prior Function Prior Level of Function : Independent/Modified Independent                     Extremity/Trunk Assessment   Upper Extremity Assessment  Upper Extremity Assessment: Overall WFL for tasks assessed    Lower Extremity Assessment Lower Extremity Assessment: LLE deficits/detail    Cervical / Trunk Assessment Cervical / Trunk Assessment: Normal  Communication   Communication Communication: No apparent difficulties    Cognition Arousal: Alert Behavior During Therapy: WFL for tasks assessed/performed   PT - Cognitive impairments: No apparent impairments                          Following commands: Intact       Cueing Cueing Techniques: Verbal cues     General Comments      Exercises Total Joint Exercises Ankle Circles/Pumps: AROM, Both, 15 reps, Supine   Assessment/Plan    PT Assessment Patient needs continued PT services  PT Problem List Decreased strength;Decreased range of motion;Decreased activity tolerance;Decreased balance;Decreased mobility;Decreased knowledge of use of DME;Obesity;Pain       PT Treatment Interventions DME instruction;Gait training;Stair training;Functional mobility training;Therapeutic activities;Therapeutic exercise;Patient/family education;Balance training    PT Goals (Current goals can be found in the Care Plan section)  Acute Rehab PT Goals Patient Stated Goal: Regain IND PT Goal Formulation: With patient Time For Goal Achievement: 12/18/24 Potential to Achieve Goals: Good    Frequency 7X/week     Co-evaluation               AM-PAC PT 6 Clicks Mobility  Outcome Measure Help needed turning from your back to your side while in a flat bed without using bedrails?: A Little Help needed moving from lying on your back to sitting on the side of a flat bed without using bedrails?: A Little Help needed moving to and from a bed to a chair (including a wheelchair)?: A Little Help needed standing up from a chair using your arms (e.g., wheelchair or bedside chair)?: A Little Help needed to walk in hospital room?: A Little Help needed climbing 3-5 steps with a railing? : A Lot 6 Click Score: 17    End of Session Equipment Utilized During Treatment: Gait belt;Left knee immobilizer Activity Tolerance: Patient tolerated treatment well Patient left: in chair;with call bell/phone within reach;with chair alarm set Nurse Communication: Mobility status PT Visit Diagnosis: Unsteadiness on feet (R26.81);Difficulty in walking, not elsewhere classified (R26.2)    Time: 1405-1430 PT Time Calculation (min) (ACUTE ONLY): 25  min   Charges:   PT Evaluation $PT Eval Low Complexity: 1 Low PT Treatments $Gait Training: 8-22 mins PT General Charges $$ ACUTE PT VISIT: 1 Visit         [facility] PT Acute Rehabilitation Services Office [phone]   Madyn Ivins 12/11/2024, 4:18 PM

## 2024-12-11 NOTE — Interval H&P Note (Signed)
 History and Physical Interval Note: The patient understands that he is here today for a left total knee replacement to treat his significant left knee pain and arthritis.  There has been no acute or interval change in his medical status.  The risks and benefits of surgery have been discussed in detail and informed consent has been obtained.  The left operative hip has been marked.   12/11/2024 6:49 AM  Charles Morales  has presented today for surgery, with the diagnosis of Osteoarthritis Left Knee.  The various methods of treatment have been discussed with the patient and family. After consideration of risks, benefits and other options for treatment, the patient has consented to  Procedures: ARTHROPLASTY, KNEE, TOTAL (Left) as a surgical intervention.  The patient's history has been reviewed, patient examined, no change in status, stable for surgery.  I have reviewed the patient's chart and labs.  Questions were answered to the patient's satisfaction.     [name]

## 2024-12-11 NOTE — Anesthesia Postprocedure Evaluation (Signed)
 Anesthesia Post Note  Patient: Charles Morales  Procedure(s) Performed: ARTHROPLASTY, KNEE, TOTAL (Left: Knee)     Patient location during evaluation: PACU Anesthesia Type: Spinal Level of consciousness: awake and alert Pain management: pain level controlled Vital Signs Assessment: post-procedure vital signs reviewed and stable Respiratory status: spontaneous breathing and respiratory function stable Cardiovascular status: blood pressure returned to baseline and stable Postop Assessment: spinal receding Anesthetic complications: no   No notable events documented.  Last Vitals:  Vitals:   12/11/24 0930 12/11/24 0945  BP: (!) 96/59 100/61  Pulse: (!) 59 60  Resp: 13 15  Temp: (!) 36.4 C   SpO2: 95% 98%    Last Pain:  Vitals:   12/11/24 0945  TempSrc:   PainSc: 0-No pain    LLE Motor Response: Purposeful movement (12/11/24 0945) LLE Sensation: Decreased;Numbness (12/11/24 0945) RLE Motor Response: Purposeful movement (12/11/24 0945) RLE Sensation: Decreased;Numbness (12/11/24 0945) L Sensory Level: S1-Sole of foot, small toes (12/11/24 0945) R Sensory Level: L5-Outer lower leg, top of foot, great toe (12/11/24 0945)  [name]

## 2024-12-11 NOTE — Progress Notes (Signed)
°   12/11/24 2251  BiPAP/CPAP/SIPAP  $ Non-Invasive Home Ventilator  Initial  BiPAP/CPAP/SIPAP Pt Type Adult  BiPAP/CPAP/SIPAP Resmed  Mask Type Nasal pillows  Dentures removed? Not applicable  FiO2 (%) 21 %  Patient Home Machine No  Patient Home Mask Yes  Patient Home Tubing Yes  Auto Titrate Yes  Minimum cmH2O 5 cmH2O  Maximum cmH2O 20 cmH2O  CPAP/SIPAP surface wiped down Yes  Device Plugged into RED Power Outlet Yes  BiPAP/CPAP /SiPAP Vitals  Resp 18  SpO2 96 %  Bilateral Breath Sounds Clear;Diminished  MEWS Score/Color  MEWS Score 0  MEWS Score Color Green

## 2024-12-11 NOTE — Transfer of Care (Signed)
 Immediate Anesthesia Transfer of Care Note  Patient: Charles Morales  Procedure(s) Performed: ARTHROPLASTY, KNEE, TOTAL (Left: Knee)  Patient Location: PACU  Anesthesia Type:Spinal  Level of Consciousness: awake  Airway & Oxygen Therapy: Patient Spontanous Breathing and Patient connected to face mask oxygen  Post-op Assessment: Report given to RN and Post -op Vital signs reviewed and stable  Post vital signs: Reviewed and stable  Last Vitals:  Vitals Value Taken Time  BP 89/56 12/11/24 09:09  Temp    Pulse 59 12/11/24 09:10  Resp 16 12/11/24 09:10  SpO2 98 % 12/11/24 09:10  Vitals shown include unfiled device data.  Last Pain:  Vitals:   12/11/24 0549  TempSrc:   PainSc: 9          Complications: No notable events documented.

## 2024-12-11 NOTE — Anesthesia Preprocedure Evaluation (Signed)
 Anesthesia Evaluation  Patient identified by MRN, date of birth, ID band Patient awake    Reviewed: Allergy & Precautions, NPO status , Patient's Chart, lab work & pertinent test results  Airway Mallampati: III  TM Distance: >3 FB Neck ROM: Full    Dental  (+) Dental Advisory Given   Pulmonary sleep apnea and Continuous Positive Airway Pressure Ventilation , former smoker   breath sounds clear to auscultation       Cardiovascular hypertension, Pt. on medications  Rhythm:Regular Rate:Normal     Neuro/Psych negative neurological ROS     GI/Hepatic Neg liver ROS,GERD  ,,  Endo/Other    Class 3 obesity  Renal/GU Renal disease     Musculoskeletal  (+) Arthritis ,    Abdominal   Peds  Hematology negative hematology ROS (+)   Anesthesia Other Findings   Reproductive/Obstetrics                              Anesthesia Physical Anesthesia Plan  ASA: 3  Anesthesia Plan: Spinal   Post-op Pain Management: Regional block*, Tylenol  PO (pre-op)* and Toradol  IV (intra-op)*   Induction: Intravenous  PONV Risk Score and Plan: 1 and Propofol  infusion, Dexamethasone , Ondansetron  and Treatment may vary due to age or medical condition  Airway Management Planned: Natural Airway and Simple Face Mask  Additional Equipment:   Intra-op Plan:   Post-operative Plan:   Informed Consent: I have reviewed the patients History and Physical, chart, labs and discussed the procedure including the risks, benefits and alternatives for the proposed anesthesia with the patient or authorized representative who has indicated his/her understanding and acceptance.       Plan Discussed with: CRNA  Anesthesia Plan Comments:         Anesthesia Quick Evaluation

## 2024-12-11 NOTE — Anesthesia Procedure Notes (Signed)
 Date/Time: 12/11/2024 7:15 AM  Performed by: Therisa Doyal CROME, CRNAOxygen Delivery Method: Simple face mask

## 2024-12-11 NOTE — Op Note (Signed)
 Operative Note  Date of operation: 12/11/2024 Preoperative diagnosis: Left knee primary osteoarthritis Postoperative diagnosis: Same  Procedure: Left press-fit total knee arthroplasty  Implants: Biomet/Zimmer persona press-fit knee system (CR medial congruent) Implant Name Type Inv. Item Serial No. Manufacturer Lot No. LRB No. Used Action  COMPONENT FEM KNEE STD PS 7 LT - [id number] Joint COMPONENT FEM KNEE STD PS 7 LT  ZIMMER RECON(ORTH,TRAU,BIO,SG) [id number] Left 1 Implanted  STEM TIB PERS SZ E 5D LT - [id number] Screw STEM TIB PERS SZ E 5D LT  ZIMMER RECON(ORTH,TRAU,BIO,SG) [id number] Left 1 Implanted  COMPONENT PATELLA 3 PEG 35 - [id number] Joint COMPONENT PATELLA 3 PEG 35  ZIMMER RECON(ORTH,TRAU,BIO,SG) [id number] Left 1 Implanted  STEM TIBIAL SZ6-7 E-F10 - [id number] Stem STEM TIBIAL SZ6-7 E-F10  ZIMMER RECON(ORTH,TRAU,BIO,SG) [id number] Left 1 Implanted   Surgeon: Lonni GRADE. Vernetta, MD Assistant: Tory Gaskins, PA-C  Anesthesia: #1 left lower extremity adductor canal block, #2 spinal, #3 local Tourniquet time: Less than 1 hour EBL: Less than 50 cc Antibiotics: IV Ancef  Complications: None  Indications: The patient is a 63 year old gentleman with debilitating arthritis involving his left knee this been well-documented clinical exam and x-ray findings.  He has tried and failed conservative treatment for over a year.  His left knee pain is daily and it is detrimentally affecting his mobility, his quality of life and his actives a day living to the point he does wish to proceed with a knee replacement left side and we agree with this as well.  We have replaced his right knee about 2 years ago and that is done very well.  Having had knee replacement surgery before he is fully aware of the risks of acute blood loss anemia, nerve and vessel injury, fracture, infection, DVT, implant failure and wound healing issues.  He understands that our goals are hopefully decreased pain, improved mobility  and improved quality of life.  Procedure description: After informed consent was obtained appropriate left knee was marked, anesthesia obtained a left lower extremity adductor canal block in the holding room.  The patient was then brought to the operating room and set up on the operative table spinal anesthesia was obtained.  He was then laid supine position the operating table and a Foley catheter was placed.  A nonsterile tractors placed around his upper left thigh and his left thigh, knee, leg and ankle were prepped and draped in DuraPrep and sterile drapes including a sterile stockinette.  A timeout was called and he was identified as the correct patient and the correct the left knee.  An Esmarch was then used to wrap out the leg and the tourniquet was inflated to 300 mm of pressure.  With the knee extended a direct midline incision was then made over the patella and carried proximally distally.  Dissection was carried down to the knee joint and a medial parapatellar arthrotomy was made finding a moderate joint effusion.  With the knee in a flexed position we found significant cartilage loss in all 3 compartments of the knee.  Osteophytes were removed from all 3 compartments as well as the ACL and medial lateral meniscus.  We then used an  extramedullary based cutting guide for making her proximal tibia cut correcting her varus and valgus and a 7 degree slope.  We made this cut to take 2 mm off the low side.  We then used a intramedullary base cutting guide for distal femur cut setting this for a left knee at 5 degrees externally rotated and a  10 mm distal femoral cut.  We brought the knee back down to full extension and achieve full extension with a 10 mm extension block.  We then went back to the femur and put a femoral sizing guide based off the epicondylar axis and Whitesides line.  Based off of this we chose a size 7 femur.  We placed a 4-in-1 cutting block for a size 7 femur and made our anterior and  posterior cuts followed by her chamfer cuts.  We then backed the tibia and chose a size E left tibial tray for coverage over the tibial plateau setting the rotation of the tibial tubercle and the femur.  We did a drill hole and keel punch off of this and found excellent quality bone for press-fit implants.  He does have a press-fit knee on his right knee and that has done well over 2 years now.  We then trialed our size E left tibia combined with our size 7 left CR standard femur.  We trialed a 10 mm thickness polythene insert and a 12 mm thickness and we felt like even that was tight we can get the 12 mm thickness implant in place.  We then trialed a size 35 patella button and drilled 3 holes for a size 35 patella.  Again we are pleased with range of motion and stability of the knee.  We then removed all trial components from the knee and irrigated the knee with normal saline solution.  We placed Marcaine  with epinephrine  around the arthrotomy.  Next we flexed the knee and dried the knee real well and placed our Biomet/Zimmer persona press-fit tibial tray for a left knee size E followed by press fitting our size 7 left CR standard femur.  We then tried to place our real 12 mm thickness polyethylene insert and after several multiple attempts we could not get that insert in place so we went down to a 10 mm thickness insert and got that in place the knee was very stable and had full range of motion with a 10 mm thickness left polyethylene medial congruent insert.  We then press-fit our size 35 patella button.  Again we put the knee through several cycles of motion and we are pleased with range of motion and stability.  We then let the tourniquet down and hemostasis was obtained with electrocautery.  The arthrotomy was closed with interrupted #1 Vicryl suture followed by 0 Vicryl to close the deep tissue and 2-0 Vicryl close subcutaneous tissue.  Skin was closed with staples.  Well-padded sterile dressing was applied.   The patient was taken the recovery room in stable condition.  During surgery he did showed to have a heart block and this may require consultation with cardiology postop but he was stable overall.  Tory Gaskins, PA-C did assist during interrogation beginning in and his assistance was crucial and medically necessary for soft tissue management and retraction, helping guide implant placement and a layered closure of the wound.

## 2024-12-11 NOTE — Anesthesia Procedure Notes (Signed)
 Spinal  Patient location during procedure: OR Start time: 12/11/2024 7:13 AM End time: 12/11/2024 7:18 AM Reason for block: surgical anesthesia  Staffing Performed: anesthesiologist  Authorized by: Epifanio Charleston, MD   Performed by: Epifanio Charleston, MD  Preanesthetic Checklist Completed: patient identified, IV checked, site marked, risks and benefits discussed, surgical consent, monitors and equipment checked, pre-op evaluation and timeout performed Spinal Block Patient position: sitting Prep: DuraPrep Patient monitoring: heart rate, cardiac monitor, continuous pulse ox and blood pressure Approach: midline Location: L3-4 Injection technique: single-shot Needle Needle type: Sprotte  Needle gauge: 24 G Needle length: 9 cm Assessment Sensory level: T4 Events: CSF return

## 2024-12-11 NOTE — TOC Transition Note (Signed)
 Transition of Care [facility]) - Discharge Note   Patient Details  Name: Charles Morales MRN: [id number] Date of Birth: [birth year]  [facility] of [facility] CM/SW Contact:  NORMAN ASPEN, LCSW Phone Number: 12/11/2024, 2:46 PM   Clinical Narrative:     Met with pt who confirms he has needed DME in the home.  Pt reports his HH follow up already arranged by Worker's Comp with [employer].  No further IP CM needs.  Final next level of care: Home w Home Health Services Barriers to Discharge: No Barriers Identified   Patient Goals and CMS Choice Patient states their goals for this hospitalization and ongoing recovery are:: return home          Discharge Placement                       Discharge Plan and Services Additional resources added to the After Visit Summary for                  DME Arranged: N/A DME Agency: NA                  Social Drivers of Health (SDOH) Interventions SDOH Screenings   Food Insecurity: No Food Insecurity (12/11/2024)  Housing: Unknown (12/11/2024)  Transportation Needs: No Transportation Needs (12/11/2024)  Utilities: Not At Risk (12/11/2024)  Alcohol Screen: Low Risk (12/16/2023)  Depression (PHQ2-9): Low Risk (06/04/2024)  Financial Resource Strain: Low Risk (12/16/2023)  Physical Activity: Unknown (12/16/2023)  Social Connections: Socially Integrated (12/16/2023)  Stress: No Stress Concern Present (12/16/2023)  Tobacco Use: High Risk (12/11/2024)     Readmission Risk Interventions     No data to display

## 2024-12-11 NOTE — Anesthesia Procedure Notes (Addendum)
 Anesthesia Regional Block: Adductor canal block   Pre-Anesthetic Checklist: , timeout performed,  Correct Patient, Correct Site, Correct Laterality,  Correct Procedure, Correct Position, site marked,  Risks and benefits discussed,  Surgical consent,  Pre-op evaluation,  At surgeon's request and post-op pain management  Laterality: Left  Prep: chloraprep       Needles:  Injection technique: Single-shot  Needle Type: Echogenic Needle     Needle Length: 9cm  Needle Gauge: 21     Additional Needles:   Procedures:,,,, ultrasound used (permanent image in chart),,    Narrative:  Start time: 12/11/2024 6:54 AM End time: 12/11/2024 6:59 AM Injection made incrementally with aspirations every 5 mL.  Performed by: Personally  Anesthesiologist: Epifanio Charleston, MD

## 2024-12-12 ENCOUNTER — Other Ambulatory Visit (HOSPITAL_COMMUNITY): Payer: Self-pay

## 2024-12-12 DIAGNOSIS — M1712 Unilateral primary osteoarthritis, left knee: Secondary | ICD-10-CM | POA: Diagnosis not present

## 2024-12-12 LAB — CBC
HCT: 43.1 % (ref 39.0–52.0)
Hemoglobin: 14.5 g/dL (ref 13.0–17.0)
MCH: 30.5 pg (ref 26.0–34.0)
MCHC: 33.6 g/dL (ref 30.0–36.0)
MCV: 90.7 fL (ref 80.0–100.0)
Platelets: 154 K/uL (ref 150–400)
RBC: 4.75 MIL/uL (ref 4.22–5.81)
RDW: 14.7 % (ref 11.5–15.5)
WBC: 10.5 K/uL (ref 4.0–10.5)
nRBC: 0 % (ref 0.0–0.2)

## 2024-12-12 LAB — BASIC METABOLIC PANEL WITH GFR
Anion gap: 9 (ref 5–15)
BUN: 11 mg/dL (ref 8–23)
CO2: 27 mmol/L (ref 22–32)
Calcium: 9.1 mg/dL (ref 8.9–10.3)
Chloride: 99 mmol/L (ref 98–111)
Creatinine, Ser: 0.76 mg/dL (ref 0.61–1.24)
GFR, Estimated: >60 mL/min (ref >60)
Glucose, Bld: 139 mg/dL — ABNORMAL HIGH (ref 70–99)
Potassium: 4.3 mmol/L (ref 3.5–5.1)
Sodium: 135 mmol/L (ref 135–145)

## 2024-12-12 MED ORDER — OXYCODONE HCL 5 MG PO TABS: 5.0000 mg | ORAL_TABLET | Freq: Four times a day (QID) | ORAL | 0 refills | Status: DC | PRN

## 2024-12-12 MED ORDER — OXYCODONE HCL 5 MG PO TABS
5.0000 mg | ORAL_TABLET | ORAL | 0 refills | Status: DC | PRN
  Filled 2024-12-12: qty 30, 5d supply, fill #0

## 2024-12-12 MED ORDER — ASPIRIN 81 MG PO CHEW
81.0000 mg | CHEWABLE_TABLET | Freq: Two times a day (BID) | ORAL | 0 refills | Status: AC
  Filled 2024-12-12: qty 30, 15d supply, fill #0

## 2024-12-12 MED ORDER — TIZANIDINE HCL 4 MG PO TABS
4.0000 mg | ORAL_TABLET | Freq: Four times a day (QID) | ORAL | 0 refills | Status: DC | PRN
  Filled 2024-12-12: qty 30, 8d supply, fill #0

## 2024-12-12 MED ORDER — TIZANIDINE HCL 4 MG PO TABS: 4.0000 mg | ORAL_TABLET | Freq: Four times a day (QID) | ORAL | 0 refills | Status: DC | PRN

## 2024-12-12 NOTE — Discharge Instructions (Signed)

## 2024-12-12 NOTE — Progress Notes (Signed)
 Physical Therapy Treatment Patient Details Name: Charles Morales MRN: [id number] DOB: 1961/12/03 Today's Date: 12/12/2024   History of Present Illness Pt s/p L TKR and with hx of R TKR in 2023    PT Comments  Pt continues very cooperative and progressing well with mobility including up to ambulate in hall, negotiated stairs, decreasing assist level for most tasks and written therex provided and reviewed.  Pt eager for dc home this date.    If plan is discharge home, recommend the following:     Can travel by private vehicle        Equipment Recommendations  None recommended by PT    Recommendations for Other Services       Precautions / Restrictions Precautions Precautions: Fall;Knee Required Braces or Orthoses: Knee Immobilizer - Left Knee Immobilizer - Left: Discontinue once straight leg raise with < 10 degree lag Restrictions Weight Bearing Restrictions Per Provider Order: Yes LLE Weight Bearing Per Provider Order: Weight bearing as tolerated     Mobility  Bed Mobility Overal bed mobility: Needs Assistance Bed Mobility: Supine to Sit, Sit to Supine     Supine to sit: Min assist Sit to supine: Supervision   General bed mobility comments: Increased time with cues for sequence and use of R LE to self assist; pt using gait belt to assist L LE back onto bed    Transfers Overall transfer level: Needs assistance Equipment used: Rolling walker (2 wheels) Transfers: Sit to/from Stand Sit to Stand: Supervision, From elevated surface           General transfer comment: min cues for LE management and use of UEs to self assist    Ambulation/Gait Ambulation/Gait assistance: Contact guard assist, Supervision Gait Distance (Feet): 70 Feet Assistive device: Rolling walker (2 wheels) Gait Pattern/deviations: Step-to pattern, Decreased step length - right, Decreased step length - left, Shuffle, Trunk flexed Gait velocity: decr     General Gait Details: min cues for  sequence, posture and position from RW   Stairs Stairs: Yes Stairs assistance: Contact guard assist Stair Management: One rail Right, Step to pattern, Forwards, With cane Number of Stairs: 5 General stair comments: 2+3 stairs with rail and QC; cues for sequence and QC placement   Wheelchair Mobility     Tilt Bed    Modified Rankin (Stroke Patients Only)       Balance Overall balance assessment: Needs assistance Sitting-balance support: No upper extremity supported Sitting balance-Leahy Scale: Good     Standing balance support: Single extremity supported Standing balance-Leahy Scale: Fair                              Hotel Manager: No apparent difficulties  Cognition Arousal: Alert Behavior During Therapy: WFL for tasks assessed/performed   PT - Cognitive impairments: No apparent impairments                         Following commands: Intact      Cueing Cueing Techniques: Verbal cues  Exercises Total Joint Exercises Ankle Circles/Pumps: AROM, Both, 15 reps, Supine Quad Sets: AROM, Both, 10 reps, Supine Heel Slides: AAROM, Left, Supine, 10 reps Straight Leg Raises: AAROM, Left, 15 reps, Supine    General Comments        Pertinent Vitals/Pain Pain Assessment Pain Assessment: 0-10 Pain Score: 5  Pain Location: L knee with activity Pain Descriptors / Indicators: Aching, Sore Pain  Intervention(s): Limited activity within patient's tolerance, Monitored during session, Premedicated before session    Home Living                          Prior Function            PT Goals (current goals can now be found in the care plan section) Acute Rehab PT Goals Patient Stated Goal: Regain IND PT Goal Formulation: With patient Time For Goal Achievement: 12/18/24 Potential to Achieve Goals: Good Progress towards PT goals: Progressing toward goals    Frequency    7X/week      PT Plan       Co-evaluation              AM-PAC PT 6 Clicks Mobility   Outcome Measure  Help needed turning from your back to your side while in a flat bed without using bedrails?: A Little Help needed moving from lying on your back to sitting on the side of a flat bed without using bedrails?: A Little Help needed moving to and from a bed to a chair (including a wheelchair)?: A Little Help needed standing up from a chair using your arms (e.g., wheelchair or bedside chair)?: A Little Help needed to walk in hospital room?: A Little Help needed climbing 3-5 steps with a railing? : A Little 6 Click Score: 18    End of Session Equipment Utilized During Treatment: Gait belt;Left knee immobilizer Activity Tolerance: Patient tolerated treatment well Patient left: in bed;with call bell/phone within reach;with family/visitor present Nurse Communication: Mobility status PT Visit Diagnosis: Unsteadiness on feet (R26.81);Difficulty in walking, not elsewhere classified (R26.2)     Time: 8991-8964 PT Time Calculation (min) (ACUTE ONLY): 27 min  Charges:    $Gait Training: 8-22 mins $Therapeutic Exercise: 8-22 mins $Therapeutic Activity: 8-22 mins PT General Charges $$ ACUTE PT VISIT: 1 Visit                     [facility] PT Acute Rehabilitation Services Office [phone]    Denaisha Swango 12/12/2024, 11:43 AM

## 2024-12-12 NOTE — Plan of Care (Signed)
  Problem: Pain Management: Goal: Pain level will decrease with appropriate interventions Outcome: Progressing   Problem: Activity: Goal: Range of joint motion will improve Outcome: Progressing   Problem: Education: Goal: Knowledge of the prescribed therapeutic regimen will improve Outcome: Progressing   Problem: Safety: Goal: Ability to remain free from injury will improve Outcome: Progressing

## 2024-12-12 NOTE — Progress Notes (Signed)
 Subjective: 1 Day Post-Op Procedures (LRB): ARTHROPLASTY, KNEE, TOTAL (Left) Patient reports pain as moderate.    Objective: Vital signs in last 24 hours: Temp:  [97.6 F (36.4 C)-98.6 F (37 C)] 98.3 F (36.8 C) (12/13 0939) Pulse Rate:  [52-65] 64 (12/13 0939) Resp:  [14-20] 18 (12/13 0939) BP: (115-139)/(69-81) 139/70 (12/13 0939) SpO2:  [93 %-97 %] 97 % (12/13 0939) FiO2 (%):  [21 %] 21 % (12/12 2251) Weight:  [142.9 kg] 142.9 kg (12/12 1135)  Intake/Output from previous day: 12/12 0701 - 12/13 0700 In: 3715.3 [P.O.:1080; I.V.:2185.3; IV Piggyback:450] Out: 2300 [Urine:2225; Blood:75] Intake/Output this shift: Total I/O In: -  Out: 100 [Urine:100]  Recent Labs    12/12/24 0331  HGB 14.5   Recent Labs    12/12/24 0331  WBC 10.5  RBC 4.75  HCT 43.1  PLT 154   Recent Labs    12/12/24 0331  NA 135  K 4.3  CL 99  CO2 27  BUN 11  CREATININE 0.76  GLUCOSE 139*  CALCIUM  9.1   No results for input(s): LABPT, INR in the last 72 hours.  Sensation intact distally Intact pulses distally Dorsiflexion/Plantar flexion intact Incision: dressing C/D/I Compartment soft   Assessment/Plan: 1 Day Post-Op Procedures (LRB): ARTHROPLASTY, KNEE, TOTAL (Left) Up with therapy Discharge home with home health      Charles Morales 12/12/2024, 10:58 AM

## 2024-12-12 NOTE — Progress Notes (Signed)
 Physical Therapy Treatment Patient Details Name: Charles Morales MRN: [id number] DOB: [birth year] Today's Date: 12/12/2024   History of Present Illness Pt s/p L TKR and with hx of R TKR in 2023    PT Comments  Pt motivated and progressing well with mobility including up to ambulate increased distance, decreased assist required for most tasks and HEP initiated.  Pt hopeful for dc home this date.    If plan is discharge home, recommend the following:     Can travel by private vehicle        Equipment Recommendations  None recommended by PT    Recommendations for Other Services       Precautions / Restrictions Precautions Precautions: Fall;Knee Required Braces or Orthoses: Knee Immobilizer - Left Knee Immobilizer - Left: Discontinue once straight leg raise with < 10 degree lag Restrictions Weight Bearing Restrictions Per Provider Order: Yes LLE Weight Bearing Per Provider Order: Weight bearing as tolerated     Mobility  Bed Mobility Overal bed mobility: Needs Assistance Bed Mobility: Supine to Sit     Supine to sit: Min assist     General bed mobility comments: Increased time with cues for sequence and use of R LE to self assist    Transfers Overall transfer level: Needs assistance Equipment used: Rolling walker (2 wheels) Transfers: Sit to/from Stand Sit to Stand: From elevated surface, Contact guard assist           General transfer comment: Steady assist with min cues for LE management and use of UEs to self assist    Ambulation/Gait Ambulation/Gait assistance: Contact guard assist Gait Distance (Feet): 123 Feet Assistive device: Rolling walker (2 wheels) Gait Pattern/deviations: Step-to pattern, Decreased step length - right, Decreased step length - left, Shuffle, Trunk flexed Gait velocity: decr     General Gait Details: cues for sequence, posture and position from [employer]             Wheelchair Mobility     Tilt Bed    Modified  Rankin (Stroke Patients Only)       Balance Overall balance assessment: Needs assistance Sitting-balance support: No upper extremity supported Sitting balance-Leahy Scale: Good     Standing balance support: Single extremity supported Standing balance-Leahy Scale: Poor                              Communication Communication Communication: No apparent difficulties  Cognition Arousal: Alert Behavior During Therapy: WFL for tasks assessed/performed   PT - Cognitive impairments: No apparent impairments                         Following commands: Intact      Cueing Cueing Techniques: Verbal cues  Exercises Total Joint Exercises Ankle Circles/Pumps: AROM, Both, 15 reps, Supine Quad Sets: AROM, Both, 10 reps, Supine Heel Slides: AAROM, Left, Supine, 10 reps Straight Leg Raises: AAROM, Left, 15 reps, Supine    General Comments        Pertinent Vitals/Pain Pain Assessment Pain Assessment: 0-10 Pain Score: 5  Pain Location: L knee with activity Pain Descriptors / Indicators: Aching, Sore Pain Intervention(s): Limited activity within patient's tolerance, Monitored during session, Premedicated before session    Home Living                          Prior Function  PT Goals (current goals can now be found in the care plan section) Acute Rehab PT Goals Patient Stated Goal: Regain IND PT Goal Formulation: With patient Time For Goal Achievement: 12/18/24 Potential to Achieve Goals: Good Progress towards PT goals: Progressing toward goals    Frequency    7X/week      PT Plan      Co-evaluation              AM-PAC PT 6 Clicks Mobility   Outcome Measure  Help needed turning from your back to your side while in a flat bed without using bedrails?: A Little Help needed moving from lying on your back to sitting on the side of a flat bed without using bedrails?: A Little Help needed moving to and from a bed to a  chair (including a wheelchair)?: A Little Help needed standing up from a chair using your arms (e.g., wheelchair or bedside chair)?: A Little Help needed to walk in hospital room?: A Little Help needed climbing 3-5 steps with a railing? : A Lot 6 Click Score: 17    End of Session Equipment Utilized During Treatment: Gait belt;Left knee immobilizer Activity Tolerance: Patient tolerated treatment well Patient left: in chair;with call bell/phone within reach;with chair alarm set Nurse Communication: Mobility status PT Visit Diagnosis: Unsteadiness on feet (R26.81);Difficulty in walking, not elsewhere classified (R26.2)     Time: 9195-9163 PT Time Calculation (min) (ACUTE ONLY): 32 min  Charges:    $Gait Training: 8-22 mins $Therapeutic Exercise: 8-22 mins PT General Charges $$ ACUTE PT VISIT: 1 Visit                     [facility] PT Acute Rehabilitation Services Office [phone]    Jeramia Saleeby 12/12/2024, 9:09 AM

## 2024-12-12 NOTE — Discharge Summary (Signed)
 Patient ID: Charles Morales MRN: [id number] DOB/AGE: 62-Aug-1962 62 y.o.  Admit date: 12/11/2024 Discharge date: 12/12/2024  Admission Diagnoses:  Principal Problem:   Unilateral primary osteoarthritis, left knee Active Problems:   Status post total left knee replacement   Discharge Diagnoses:  Same  Past Medical History:  Diagnosis Date   Allergy    Arthritis    right knee   Esophageal reflux    Family history of breast cancer    Family history of genetic disease carrier    sister NBN pathogenic variant   Family history of pancreatic cancer    Fatty liver    HLD (hyperlipidemia)    HTN (hypertension)    Morbid obesity (HCC)    Pre-diabetes    Sleep apnea    USES CPAP    Tobacco use disorder    Unspecified sleep apnea     Surgeries: Procedures: ARTHROPLASTY, KNEE, TOTAL on 12/11/2024   Consultants:   Discharged Condition: Improved  Hospital Course: Charles Morales is an 63 y.o. male who was admitted 12/11/2024 for operative treatment ofUnilateral primary osteoarthritis, left knee. Patient has severe unremitting pain that affects sleep, daily activities, and work/hobbies. After pre-op clearance the patient was taken to the operating room on 12/11/2024 and underwent  Procedures: ARTHROPLASTY, KNEE, TOTAL.    Patient was given perioperative antibiotics:  Anti-infectives (From admission, onward)    Start     Dose/Rate Route Frequency Ordered Stop   12/11/24 1330  ceFAZolin  (ANCEF ) IVPB 2g/100 mL premix        2 g 200 mL/hr over 30 Minutes Intravenous Every 6 hours 12/11/24 1147 12/11/24 2030   12/11/24 0600  ceFAZolin  (ANCEF ) IVPB 3g/150 mL premix        3 g 300 mL/hr over 30 Minutes Intravenous On call to O.R. 12/11/24 9462 12/11/24 0727        Patient was given sequential compression devices, early ambulation, and chemoprophylaxis to prevent DVT.  Inpatient Morphine Milligram Equivalents Per Day 12/12 - 12/13   Values displayed are in units of MME/Day     Order Start / End Date Yesterday Today    oxyCODONE  (Oxy IR/ROXICODONE ) immediate release tablet 5 mg 12/12 - 12/12 7.5 of Unknown --    oxyCODONE  (ROXICODONE ) 5 MG/5ML solution 5 mg 12/12 - 12/12 0 of Unknown --      Group total: 7.5 of Unknown     fentaNYL  (SUBLIMAZE ) injection 25-50 mcg 12/12 - 12/12 15 of 45-90 --    fentaNYL  (SUBLIMAZE ) injection 12/12 - 12/12 *30 of 30 --    HYDROmorphone  (DILAUDID ) injection 0.5-1 mg 12/12 - No end date 60 of 50-100 0 of 80-160    oxyCODONE  (Oxy IR/ROXICODONE ) immediate release tablet 5-10 mg 12/12 - No end date 0 of 30-60 15 of 45-90    oxyCODONE  (Oxy IR/ROXICODONE ) immediate release tablet 10-15 mg 12/12 - No end date 60 of 60-90 45 of 90-135    Daily Totals  * 172.5 of Unknown (at least 215-370) 60 of 215-385  *One-Step medication  Calculation Errors     Order Type Date Details   oxyCODONE  (Oxy IR/ROXICODONE ) immediate release tablet 5 mg Ordered Dose -- Insufficient frequency information   oxyCODONE  (ROXICODONE ) 5 MG/5ML solution 5 mg Ordered Dose -- Insufficient frequency information            Patient benefited maximally from hospital stay and there were no complications.    Recent vital signs: Patient Vitals for the past 24 hrs:  BP  Temp Temp src Pulse Resp SpO2 Height Weight  12/12/24 0939 139/70 98.3 F (36.8 C) Oral 64 18 97 % -- --  12/12/24 0614 127/69 98.6 F (37 C) Oral 62 18 96 % -- --  12/12/24 0200 130/78 97.8 F (36.6 C) Oral (!) 59 17 96 % -- --  12/11/24 2251 -- -- -- -- 18 96 % -- --  12/11/24 2207 115/81 98.6 F (37 C) Oral 62 17 96 % -- --  12/11/24 1706 120/74 97.9 F (36.6 C) -- 65 14 94 % -- --  12/11/24 1449 115/74 -- -- 63 20 93 % -- --  12/11/24 1135 126/75 -- -- (!) 59 20 97 % 5' 10 (1.778 m) (!) 142.9 kg  12/11/24 1135 -- 97.6 F (36.4 C) Oral (!) 56 20 -- -- --  12/11/24 1115 121/77 -- -- (!) 52 18 94 % -- --     Recent laboratory studies:  Recent Labs    12/12/24 0331  WBC 10.5  HGB 14.5   HCT 43.1  PLT 154  NA 135  K 4.3  CL 99  CO2 27  BUN 11  CREATININE 0.76  GLUCOSE 139*  CALCIUM  9.1     Discharge Medications:   Allergies as of 12/12/2024   No Known Allergies      Medication List     TAKE these medications    acetaminophen  650 MG CR tablet Commonly known as: TYLENOL  Take 1,300 mg by mouth in the morning and at bedtime.   aspirin  81 MG chewable tablet Chew 1 tablet (81 mg total) by mouth 2 (two) times daily.   diphenhydrAMINE  25 mg capsule Commonly known as: BENADRYL  Take 25 mg by mouth in the morning.   Fish Oil 1200 MG Cpdr Take 2,400 mg by mouth 2 (two) times daily.   FLUoxetine  20 MG capsule Commonly known as: PROZAC  Take 1 capsule (20 mg total) by mouth daily. What changed: when to take this   GLUCOSAMINE-CHONDROITIN PO Take 1 tablet by mouth 2 (two) times daily.   lisinopril -hydrochlorothiazide  20-25 MG tablet Commonly known as: ZESTORETIC  Take 1 tablet by mouth daily. What changed: when to take this   Melatonin 10 MG Tabs Take 20 mg by mouth at bedtime as needed (sleep).   NON FORMULARY CPAP at night as directed   omeprazole  40 MG capsule Commonly known as: PRILOSEC Take 1 capsule (40 mg total) by mouth daily. What changed: when to take this   oxyCODONE  5 MG immediate release tablet Commonly known as: Oxy IR/ROXICODONE  Take 1-2 tablets (5-10 mg total) by mouth every 4 (four) hours as needed for moderate pain (pain score 4-6) (pain score 4-6).   rosuvastatin  5 MG tablet Commonly known as: CRESTOR  Take 1 tablet (5 mg total) by mouth daily. What changed: when to take this   tiZANidine  4 MG tablet Commonly known as: ZANAFLEX  Take 1 tablet (4 mg total) by mouth every 6 (six) hours as needed for muscle spasms.   VITAMIN B-12 PO Take 500 mcg by mouth in the morning.   VITAMIN D  PO Take 1 tablet by mouth in the morning.   Zepbound  15 MG/0.5ML Pen Generic drug: tirzepatide  INJECT 15 MG SUBCUTANEOUSLY ONCE A  WEEK What changed: See the new instructions.               Durable Medical Equipment  (From admission, onward)           Start     Ordered   12/11/24  1148  DME 3 n 1  Once        12/11/24 1147   12/11/24 1148  DME Walker rolling  Once       Question Answer Comment  Walker: With 5 Inch Wheels   Patient needs a walker to treat with the following condition Status post total left knee replacement      12/11/24 1147            Diagnostic Studies: DG Knee Left Port Result Date: 12/11/2024 EXAM: 2 VIEW(S) XRAY OF THE LEFT KNEE 12/11/2024 09:34:00 AM COMPARISON: 11/11/2024. CLINICAL HISTORY: Status post total left knee replacement. FINDINGS: BONES AND JOINTS: Left knee arthroplasty. Postsurgical changes of patellar resurfacing. No acute fracture. No malalignment. Knee joint effusion. SOFT TISSUES: Anterior skin staples are noted. Expected soft tissue changes including soft tissue gas are present. IMPRESSION: 1. Status post total left knee arthroplasty, as above. Electronically signed by: Pinkie Pebbles MD 12/11/2024 08:10 PM EST RP Workstation: [id number]    Disposition: Discharge disposition: 01-Home or Self Care       Discharge Instructions     Increase activity slowly   Complete by: As directed    No wound care   Complete by: As directed         Follow-up Information     Vernetta Lonni GRADE, MD Follow up in 2 week(s).   Specialty: Orthopedic Surgery Contact information: [address] [place]  [place] [address] [phone]                  Signed: Lonni GRADE Vernetta 12/12/2024, 11:00 AM

## 2024-12-13 ENCOUNTER — Other Ambulatory Visit (HOSPITAL_COMMUNITY): Payer: Self-pay

## 2024-12-13 ENCOUNTER — Other Ambulatory Visit: Payer: Self-pay | Admitting: Orthopedic Surgery

## 2024-12-13 MED ORDER — OXYCODONE HCL 5 MG PO TABS: 5.0000 mg | ORAL_TABLET | Freq: Four times a day (QID) | ORAL | 0 refills | Status: DC | PRN

## 2024-12-14 ENCOUNTER — Other Ambulatory Visit (HOSPITAL_COMMUNITY): Payer: Self-pay

## 2024-12-14 ENCOUNTER — Encounter (HOSPITAL_COMMUNITY): Payer: Self-pay | Admitting: Orthopaedic Surgery

## 2024-12-15 ENCOUNTER — Other Ambulatory Visit: Payer: Self-pay | Admitting: Family Medicine

## 2024-12-23 ENCOUNTER — Encounter: Payer: Self-pay | Admitting: Orthopaedic Surgery

## 2024-12-23 ENCOUNTER — Ambulatory Visit: Admitting: Orthopaedic Surgery

## 2024-12-23 DIAGNOSIS — Z96652 Presence of left artificial knee joint: Secondary | ICD-10-CM

## 2024-12-23 MED ORDER — TIZANIDINE HCL 4 MG PO TABS: 4.0000 mg | ORAL_TABLET | Freq: Four times a day (QID) | ORAL | 0 refills | Status: DC | PRN

## 2024-12-23 MED ORDER — OXYCODONE HCL 5 MG PO TABS: 5.0000 mg | ORAL_TABLET | Freq: Four times a day (QID) | ORAL | 0 refills | Status: DC | PRN

## 2024-12-23 NOTE — Progress Notes (Signed)
 The patient is here for first postoperative visit status post a left total knee replacement.  He has had a right knee replaced 2 years ago and that has done well.  His left knee had some pre-existing arthritis but then he had a work-related injury that accelerated the pain from his arthritis.  He had really been asymptomatic for a long period of time with that knee until this recent injury with his left knee.  The knee replacement is gone well.  He has been compliant with a baby aspirin  twice daily.  He denies any calf pain.  He does need a refill of his muscle relaxant and pain medication.  He starts outpatient physical therapy on 5 January.  On exam today his incision looks good.  Staples removed and Steri-Strips applied.  His calf is soft.  Extension is almost full and his flexion is to close to 90 degrees.  His right knee from his previous knee replacement moves very well.  We did refill his medications.  Will see him back in a month to see how he is doing overall.  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

## 2025-01-04 ENCOUNTER — Ambulatory Visit: Admitting: Physical Therapy

## 2025-01-04 ENCOUNTER — Other Ambulatory Visit: Payer: Self-pay | Admitting: Orthopaedic Surgery

## 2025-01-04 ENCOUNTER — Encounter: Payer: Self-pay | Admitting: Physical Therapy

## 2025-01-04 DIAGNOSIS — R2681 Unsteadiness on feet: Secondary | ICD-10-CM | POA: Diagnosis not present

## 2025-01-04 DIAGNOSIS — R2689 Other abnormalities of gait and mobility: Secondary | ICD-10-CM

## 2025-01-04 DIAGNOSIS — Z96652 Presence of left artificial knee joint: Secondary | ICD-10-CM

## 2025-01-04 DIAGNOSIS — M25562 Pain in left knee: Secondary | ICD-10-CM

## 2025-01-04 DIAGNOSIS — R600 Localized edema: Secondary | ICD-10-CM | POA: Diagnosis not present

## 2025-01-04 DIAGNOSIS — M25662 Stiffness of left knee, not elsewhere classified: Secondary | ICD-10-CM

## 2025-01-04 MED ORDER — OXYCODONE HCL 5 MG PO TABS: 5.0000 mg | ORAL_TABLET | Freq: Four times a day (QID) | ORAL | 0 refills | Status: AC | PRN

## 2025-01-04 NOTE — Therapy (Signed)
 " OUTPATIENT PHYSICAL THERAPY EVALUATION   Patient Name: Charles Morales MRN: [id number] DOB:26-Feb-1961, 64 y.o., male Today's Date: 01/04/2025  END OF SESSION:  PT End of Session - 01/04/25 1017     Visit Number 1    Number of Visits 16    Date for Recertification  03/01/25    Authorization Type Workers [employer] Rueben Mirza Case Manager: Cell [phone], Fax [phone]    PT Start Time [phone]    PT Stop Time 1005    PT Time Calculation (min) 40 min    Activity Tolerance Patient tolerated treatment well    Behavior During Therapy WFL for tasks assessed/performed          Past Medical History:  Diagnosis Date   Allergy    Arthritis    right knee   Esophageal reflux    Family history of breast cancer    Family history of genetic disease carrier    sister NBN pathogenic variant   Family history of pancreatic cancer    Fatty liver    HLD (hyperlipidemia)    HTN (hypertension)    Morbid obesity (HCC)    Pre-diabetes    Sleep apnea    USES CPAP    Tobacco use disorder    Unspecified sleep apnea    Past Surgical History:  Procedure Laterality Date   COLONOSCOPY  2013   COLONOSCOPY WITH PROPOFOL  N/A 05/20/2018   Procedure: COLONOSCOPY WITH PROPOFOL ;  Surgeon: Albertus Gordy HERO, MD;  Location: [facility];  Service: Gastroenterology;  Laterality: N/A;   KNEE ARTHROSCOPY     left   TOTAL KNEE ARTHROPLASTY Right 10/12/2022   Procedure: RIGHT TOTAL KNEE ARTHROPLASTY;  Surgeon: Vernetta Lonni GRADE, MD;  Location: [facility];  Service: Orthopedics;  Laterality: Right;   TOTAL KNEE ARTHROPLASTY Left 12/11/2024   Procedure: ARTHROPLASTY, KNEE, TOTAL;  Surgeon: Vernetta Lonni GRADE, MD;  Location: [facility];  Service: Orthopedics;  Laterality: Left;   Patient Active Problem List   Diagnosis Date Noted   Status post total left knee replacement 12/11/2024   Right kidney mass 03/02/2024   Pyelonephritis 03/02/2024   Urinary urgency 03/02/2024   Vitamin D  deficiency 12/21/2023    Stress reaction 12/20/2023   Encounter for physical examination related to employment 10/02/2023   Status post total right knee replacement 10/12/2022   Current use of proton pump inhibitor 11/20/2021   Genetic testing 09/15/2018   Family history of genetic disease carrier 09/02/2018   Family history of breast cancer    Family history of pancreatic cancer    Prostate cancer screening 08/17/2018   History of colonic polyps    Prediabetes 04/01/2016   Left ankle pain 10/14/2015   PVC's (premature ventricular contractions) 10/22/2013   Colon cancer screening 05/23/2012   Routine general medical examination at a health care facility 05/20/2012   Right knee pain 06/08/2011   G E R D 08/04/2007   Hyperlipidemia 07/22/2007   Morbid obesity (HCC) 07/22/2007   SMOKELESS TOBACCO ABUSE 07/22/2007   Hypertension, essential 07/22/2007   OSA (obstructive sleep apnea) 07/22/2007    PCP: Randeen Laine LABOR, MD  REFERRING PROVIDER: Vernetta Lonni GRADE, MD  REFERRING DIAG: Lt TKA  Rationale for Evaluation and Treatment: Rehabilitation  THERAPY DIAG:  Acute pain of left knee - Plan: PT plan of care cert/re-cert  Stiffness of left knee, not elsewhere classified - Plan: PT plan of care cert/re-cert  Localized edema - Plan: PT plan of care cert/re-cert  Other abnormalities of gait  and mobility - Plan: PT plan of care cert/re-cert  Unsteadiness on feet - Plan: PT plan of care cert/re-cert  ONSET DATE: DOS: 12/11/24   SUBJECTIVE:                                                                                                                                                                                           SUBJECTIVE STATEMENT: Pt is s/p Lt TKA on 12/11/24.  He is [facility] with [facility] today.  He had HHPT x 5 visits.  PERTINENT HISTORY:  Morbid obesity, OA, HTN, OSA   PAIN:  Are you having pain? Yes: NPRS scale: 4 currently, up to 5, at best 2/10 Pain location: Lt knee Pain  description: toothache Aggravating factors: stairs, walking longer distances, trying to increase flexion Relieving factors: repositioning, rest, uses floor bike 3-4 times a day, ice  PRECAUTIONS:  None  RED FLAGS: None   WEIGHT BEARING RESTRICTIONS:  No  FALLS:  Has patient fallen in last 6 months? 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, play with dogs, golf, no regular exercise  PATIENT GOALS:  Regain function of Lt knee, improve pain   OBJECTIVE:  Note: Objective measures were completed at Evaluation unless otherwise noted.  PATIENT SURVEYS:  Patient-Specific Activity Scoring Scheme  0 represents unable to perform. 10 represents able to perform at prior level. 0 1 2 3 4 5 6 7 8 9  10 (Date and Score)   Activity Eval     1. Stairs 5    2. Walking long distances 7    Score 6    Total score = sum of the activity scores/number of activities Minimum detectable change (90%CI) for average score = 2 points Minimum detectable change (90%CI) for single activity score = 3 points    COGNITIVE STATUS: Within functional limits for tasks assessed   SENSATION: WFL  GAIT: 01/04/2025 Distance walked: 200' within clinic Assistive device utilized: Quad cane small base Level of assistance: Modified independence Comments: recommend SPC, decreased stance on Lt; decreased Lt hip/knee flexion   EDEMA: 01/04/2025  Not formally measured due to pants; visible swelling in Lt knee and thigh noted   LOWER EXTREMITY ROM:     ROM Right eval Left eval  Knee flexion  A: 96 P: 102  Knee extension A: 3 hyper (seated LAQ) A: -5 (seated LAQ) P: -2   (Blank  rows = not tested)   LOWER EXTREMITY MMT:   01/04/25: Not formally  tested at eval; Lt knee grossly 3-/5  MMT Right eval Left eval  Hip flexion    Hip extension    Hip abduction    Hip adduction    Hip internal rotation    Hip external rotation    Knee flexion    Knee extension    Ankle dorsiflexion    Ankle plantarflexion    Ankle inversion    Ankle eversion     (Blank rows = not tested)      TREATMENT:                                                                                                                              DATE:  01/04/2025 TherEx See HEP - demonstrated with trial reps performed PRN, mod cues for comprehension    Self Care Educated on PT POC and clinical findings     PATIENT EDUCATION:  Education details: HEP Person educated: Patient and father-in-law Education method: Explanation, Demonstration, and Handouts Education comprehension: verbalized understanding, returned demonstration, and needs further education  HOME EXERCISE PROGRAM: Access Code: [email or web address] URL: [email or web address] Date: 01/04/2025 Prepared by: Corean Ku  Exercises - Long Sitting Quad Set with Towel Roll Under Heel  - 5-10 x daily - 7 x weekly - 1 sets - 10 reps - 5 sec hold - Supine Heel Slide with Strap  - 5-10 x daily - 7 x weekly - 1 sets - 10 reps - Seated Knee Extension AROM  - 3-5 x daily - 7 x weekly - 1 sets - 10 reps - 5 sec hold - Seated Knee Extension AROM  - 3-5 x daily - 7 x weekly - 1 sets - 10 reps - 10 sec hold - Seated Straight Leg Raise   - 3-5 x daily - 7 x weekly - 1 sets - 10 reps - 5 sec hold   ASSESSMENT:  CLINICAL IMPRESSION: Patient is a 64 y.o. male who was seen today for physical therapy evaluation and treatment for Lt TKA. He demonstrates decreased ROM, strength and balance, as well as gait abnormalities and expected post op pain and swelling affecting functional mobility.  He will benefit from PT to address deficits listed.     OBJECTIVE IMPAIRMENTS: Abnormal gait, decreased activity  tolerance, decreased balance, decreased mobility, difficulty walking, decreased ROM, decreased strength, increased muscle spasms, impaired flexibility, and pain.   ACTIVITY LIMITATIONS: carrying, lifting, sitting, standing, squatting, sleeping, stairs, transfers, bed mobility, hygiene/grooming, and locomotion level  PARTICIPATION LIMITATIONS: meal prep, cleaning, laundry, driving, shopping, community activity, occupation, and yard work  PERSONAL FACTORS: Age, Behavior pattern, Past/current experiences, Time since onset of injury/illness/exacerbation, and 3+ comorbidities: Morbid obesity, OA, HTN, OSA, Rt TKA are also affecting patient's functional outcome.   REHAB POTENTIAL: Good  CLINICAL DECISION MAKING: Stable/uncomplicated  EVALUATION COMPLEXITY: Low   GOALS: Goals reviewed with patient? Yes  SHORT  TERM GOALS: Target date: 02/01/2025  Independent with initial HEP Goal status: INITIAL  2.  Lt knee AROM improved 0-100 for improved mobility and function Goal status: INITIAL  LONG TERM GOALS: Target date: 03/01/2025   Independent with final HEP Goal status: INITIAL  2.  PSFS score improved by 2 points Goal status: INITIAL  3.  Lt knee AROM improved to 0-110 for improved function and mobility Goal status: INIITAL  4.  Report pain < 3/10 with standing and walking activities for improved function Goal status: INITIAL  5.  Amb without AD independently and without significant deviations for improved function and mobility Goal status: INITIAL  6.  Negotiate stairs with single handrail reciprocally with pain <2/10 Goal status: INITIAL     PLAN:  PT FREQUENCY: 2x/week  PT DURATION: 8 weeks  PLANNED INTERVENTIONS: 97164- PT Re-evaluation, 97750- Physical Performance Testing, 97110-Therapeutic exercises, 97530- Therapeutic activity, 97112- Neuromuscular re-education, 97535- Self Care, 02859- Manual therapy, [phone]- Gait training, [phone]- Aquatic Therapy, [phone]- Electrical  stimulation (unattended), 97016- Vasopneumatic device, 20560 (1-2 muscles), 20561 (3+ muscles)- Dry Needling, Patient/Family education, Balance training, Stair training, Taping, Joint mobilization, Joint manipulation, DME instructions, Cryotherapy, and Moist heat.  PLAN FOR NEXT SESSION: Review HEP, continue extension focus/quad activation, transition to [facility] and eventually no device   NEXT MD VISIT: 01/21/24   Corean JULIANNA Ku, PT, DPT 01/04/2025 10:24 AM   "

## 2025-01-04 NOTE — Telephone Encounter (Signed)
 Patient called, doing ok pain wise, only taking meds as needed, needs a refill.

## 2025-01-06 ENCOUNTER — Encounter: Payer: Self-pay | Admitting: Physical Therapy

## 2025-01-06 ENCOUNTER — Ambulatory Visit: Admitting: Physical Therapy

## 2025-01-06 DIAGNOSIS — R600 Localized edema: Secondary | ICD-10-CM

## 2025-01-06 DIAGNOSIS — M25562 Pain in left knee: Secondary | ICD-10-CM

## 2025-01-06 DIAGNOSIS — M25662 Stiffness of left knee, not elsewhere classified: Secondary | ICD-10-CM | POA: Diagnosis not present

## 2025-01-06 DIAGNOSIS — R2689 Other abnormalities of gait and mobility: Secondary | ICD-10-CM

## 2025-01-06 DIAGNOSIS — R2681 Unsteadiness on feet: Secondary | ICD-10-CM

## 2025-01-06 NOTE — Therapy (Signed)
 " OUTPATIENT PHYSICAL THERAPY TREATMENT    Patient Name: Charles Morales MRN: [id number] DOB:1961/02/20, 64 y.o., male Today's Date: 01/06/2025  END OF SESSION:  PT End of Session - 01/06/25 0942     Visit Number 2    Number of Visits 16    Date for Recertification  03/01/25    Authorization Type Workers Comp Rueben Mirza Case Manager: Cell [phone], Fax [phone]    PT Start Time [phone]    PT Stop Time 1017    PT Time Calculation (min) 43 min    Activity Tolerance Patient tolerated treatment well    Behavior During Therapy [facility] for tasks assessed/performed           Past Medical History:  Diagnosis Date   Allergy    Arthritis    right knee   Esophageal reflux    Family history of breast cancer    Family history of genetic disease carrier    sister NBN pathogenic variant   Family history of pancreatic cancer    Fatty liver    HLD (hyperlipidemia)    HTN (hypertension)    Morbid obesity (HCC)    Pre-diabetes    Sleep apnea    USES CPAP    Tobacco use disorder    Unspecified sleep apnea    Past Surgical History:  Procedure Laterality Date   COLONOSCOPY  2013   COLONOSCOPY WITH PROPOFOL  N/A 05/20/2018   Procedure: COLONOSCOPY WITH PROPOFOL ;  Surgeon: Albertus Gordy HERO, MD;  Location: [facility];  Service: Gastroenterology;  Laterality: N/A;   KNEE ARTHROSCOPY     left   TOTAL KNEE ARTHROPLASTY Right 10/12/2022   Procedure: RIGHT TOTAL KNEE ARTHROPLASTY;  Surgeon: Vernetta Lonni GRADE, MD;  Location: [facility];  Service: Orthopedics;  Laterality: Right;   TOTAL KNEE ARTHROPLASTY Left 12/11/2024   Procedure: ARTHROPLASTY, KNEE, TOTAL;  Surgeon: Vernetta Lonni GRADE, MD;  Location: [facility];  Service: Orthopedics;  Laterality: Left;   Patient Active Problem List   Diagnosis Date Noted   Status post total left knee replacement 12/11/2024   Right kidney mass 03/02/2024   Pyelonephritis 03/02/2024   Urinary urgency 03/02/2024   Vitamin D  deficiency 12/21/2023    Stress reaction 12/20/2023   Encounter for physical examination related to employment 10/02/2023   Status post total right knee replacement 10/12/2022   Current use of proton pump inhibitor 11/20/2021   Genetic testing 09/15/2018   Family history of genetic disease carrier 09/02/2018   Family history of breast cancer    Family history of pancreatic cancer    Prostate cancer screening 08/17/2018   History of colonic polyps    Prediabetes 04/01/2016   Left ankle pain 10/14/2015   PVC's (premature ventricular contractions) 10/22/2013   Colon cancer screening 05/23/2012   Routine general medical examination at a health care facility 05/20/2012   Right knee pain 06/08/2011   G E R D 08/04/2007   Hyperlipidemia 07/22/2007   Morbid obesity (HCC) 07/22/2007   SMOKELESS TOBACCO ABUSE 07/22/2007   Hypertension, essential 07/22/2007   OSA (obstructive sleep apnea) 07/22/2007    PCP: Randeen Laine LABOR, MD  REFERRING PROVIDER: Vernetta Lonni GRADE, MD  REFERRING DIAG: Lt TKA  Rationale for Evaluation and Treatment: Rehabilitation  THERAPY DIAG:  Acute pain of left knee  Stiffness of left knee, not elsewhere classified  Localized edema  Other abnormalities of gait and mobility  Unsteadiness on feet  ONSET DATE: DOS: 12/11/24   SUBJECTIVE:  SUBJECTIVE STATEMENT:  Have been using bike at home to try to loosen knee up, it was pretty tight this morning. Had a little different pain this morning than the typical, it was on the front of the L knee like a twitching sort of feeling.      Eval: Pt is s/p Lt TKA on 12/11/24.  He is walking with [facility] today.  He had HHPT x 5 visits.  PERTINENT HISTORY:  Morbid obesity, OA, HTN, OSA   PAIN:  Are you having pain? Yes: NPRS scale: 5/10 now Pain  location: Lt knee Pain description: toothache Aggravating factors: stairs, walking longer distances, trying to increase flexion Relieving factors: repositioning, rest, uses floor bike 3-4 times a day, ice  PRECAUTIONS:  None  RED FLAGS: None   WEIGHT BEARING RESTRICTIONS:  No  FALLS:  Has patient fallen in last 6 months? 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, play with dogs, golf, no regular exercise  PATIENT GOALS:  Regain function of Lt knee, improve pain   OBJECTIVE:  Note: Objective measures were completed at Evaluation unless otherwise noted.  PATIENT SURVEYS:  Patient-Specific Activity Scoring Scheme  0 represents unable to perform. 10 represents able to perform at prior level. 0 1 2 3 4 5 6 7 8 9  10 (Date and Score)   Activity Eval     1. Stairs 5    2. Walking long distances 7    Score 6    Total score = sum of the activity scores/number of activities Minimum detectable change (90%CI) for average score = 2 points Minimum detectable change (90%CI) for single activity score = 3 points    COGNITIVE STATUS: Within functional limits for tasks assessed   SENSATION: WFL  GAIT: 01/04/2025 Distance walked: 200' within clinic Assistive device utilized: Quad cane small base Level of assistance: Modified independence Comments: recommend SPC, decreased stance on Lt; decreased Lt hip/knee flexion   EDEMA: 01/04/2025  Not formally measured due to pants; visible swelling in Lt knee and thigh noted   LOWER EXTREMITY ROM:     ROM Right eval Left eval  Knee flexion  A: 96 P: 102  Knee extension A: 3 hyper (seated LAQ) A: -5 (seated LAQ) P: -2   (Blank rows = not tested)   LOWER EXTREMITY MMT:    01/04/25: Not formally tested at eval; Lt knee grossly 3-/5  MMT Right eval Left eval  Hip flexion    Hip extension    Hip abduction    Hip adduction    Hip internal rotation    Hip external rotation    Knee flexion    Knee extension    Ankle dorsiflexion    Ankle plantarflexion    Ankle inversion    Ankle eversion     (Blank rows = not tested)      TREATMENT:  DATE:   01/06/25  Scifit bike seat 10 -->8  x8 min for ROM and w/u   Self-AAROM for flexion 12x3 second holds SAQs 3# 2x12  Bridges with progressive knee flexion 3x5 (2 rounds) LAQs 3# x12  Percussion gun to quad with LEs in elevation (avoided bruising medial thigh)  Vaso to LLE in elevation x10 min        01/04/2025 TherEx See HEP - demonstrated with trial reps performed PRN, mod cues for comprehension    Self Care Educated on PT POC and clinical findings     PATIENT EDUCATION:  Education details: HEP Person educated: Patient and father-in-law Education method: Explanation, Demonstration, and Handouts Education comprehension: verbalized understanding, returned demonstration, and needs further education  HOME EXERCISE PROGRAM: Access Code: [email or web address] URL: [email or web address] Date: 01/04/2025 Prepared by: Corean Ku  Exercises - Long Sitting Quad Set with Towel Roll Under Heel  - 5-10 x daily - 7 x weekly - 1 sets - 10 reps - 5 sec hold - Supine Heel Slide with Strap  - 5-10 x daily - 7 x weekly - 1 sets - 10 reps - Seated Knee Extension AROM  - 3-5 x daily - 7 x weekly - 1 sets - 10 reps - 5 sec hold - Seated Knee Extension AROM  - 3-5 x daily - 7 x weekly - 1 sets - 10 reps - 10 sec hold - Seated Straight Leg Raise   - 3-5 x daily - 7 x weekly - 1 sets - 10 reps - 5 sec hold   ASSESSMENT:  CLINICAL IMPRESSION:  Arrives today doing well, he has  been working hard at home on HEP and his bike. Sounds like he is having some possible quad spasms driving knee pain, addressed with this manual as tolerated this visit. Did well and motivated to improve. Will continue to challenge him as tolerated.    EVAL: Patient is a 64 y.o. male who was seen today for physical therapy evaluation and treatment for Lt TKA. He demonstrates decreased ROM, strength and balance, as well as gait abnormalities and expected post op pain and swelling affecting functional mobility.  He will benefit from PT to address deficits listed.     OBJECTIVE IMPAIRMENTS: Abnormal gait, decreased activity tolerance, decreased balance, decreased mobility, difficulty walking, decreased ROM, decreased strength, increased muscle spasms, impaired flexibility, and pain.   ACTIVITY LIMITATIONS: carrying, lifting, sitting, standing, squatting, sleeping, stairs, transfers, bed mobility, hygiene/grooming, and locomotion level  PARTICIPATION LIMITATIONS: meal prep, cleaning, laundry, driving, shopping, community activity, occupation, and yard work  PERSONAL FACTORS: Age, Behavior pattern, Past/current experiences, Time since onset of injury/illness/exacerbation, and 3+ comorbidities: Morbid obesity, OA, HTN, OSA, Rt TKA are also affecting patient's functional outcome.   REHAB POTENTIAL: Good  CLINICAL DECISION MAKING: Stable/uncomplicated  EVALUATION COMPLEXITY: Low   GOALS: Goals reviewed with patient? Yes  SHORT TERM GOALS: Target date: 02/01/2025  Independent with initial HEP Goal status: INITIAL  2.  Lt knee AROM improved 0-100 for improved mobility and function Goal status: INITIAL  LONG TERM GOALS: Target date: 03/01/2025   Independent with final HEP Goal status: INITIAL  2.  PSFS score improved by 2 points Goal status: INITIAL  3.  Lt knee AROM improved to 0-110 for improved function and mobility Goal status: INIITAL  4.  Report pain < 3/10 with standing and  walking activities for improved function Goal status: INITIAL  5.  Amb without AD independently and without significant deviations for improved function and  mobility Goal status: INITIAL  6.  Negotiate stairs with single handrail reciprocally with pain <2/10 Goal status: INITIAL     PLAN:  PT FREQUENCY: 2x/week  PT DURATION: 8 weeks  PLANNED INTERVENTIONS: 97164- PT Re-evaluation, 97750- Physical Performance Testing, 97110-Therapeutic exercises, 97530- Therapeutic activity, 97112- Neuromuscular re-education, 97535- Self Care, 02859- Manual therapy, [phone]- Gait training, [phone]- Aquatic Therapy, [phone]- Electrical stimulation (unattended), 97016- Vasopneumatic device, 20560 (1-2 muscles), 20561 (3+ muscles)- Dry Needling, Patient/Family education, Balance training, Stair training, Taping, Joint mobilization, Joint manipulation, DME instructions, Cryotherapy, and Moist heat.  PLAN FOR NEXT SESSION: Review HEP, continue extension focus/quad activation, transition to [facility] and eventually no device. Continue percussion gun if this was helpful    NEXT MD VISIT: 01/21/24  Josette Rough, PT, DPT 01/06/2025 10:20 AM     "

## 2025-01-12 ENCOUNTER — Ambulatory Visit (INDEPENDENT_AMBULATORY_CARE_PROVIDER_SITE_OTHER): Admitting: Physical Therapy

## 2025-01-12 ENCOUNTER — Encounter: Payer: Self-pay | Admitting: Physical Therapy

## 2025-01-12 DIAGNOSIS — M25662 Stiffness of left knee, not elsewhere classified: Secondary | ICD-10-CM | POA: Diagnosis not present

## 2025-01-12 DIAGNOSIS — R2689 Other abnormalities of gait and mobility: Secondary | ICD-10-CM

## 2025-01-12 DIAGNOSIS — M25562 Pain in left knee: Secondary | ICD-10-CM

## 2025-01-12 DIAGNOSIS — R2681 Unsteadiness on feet: Secondary | ICD-10-CM | POA: Diagnosis not present

## 2025-01-12 DIAGNOSIS — R600 Localized edema: Secondary | ICD-10-CM

## 2025-01-12 NOTE — Therapy (Signed)
 " OUTPATIENT PHYSICAL THERAPY TREATMENT    Patient Name: Charles Morales MRN: [id number] DOB:05-12-1961, 64 y.o., male Today's Date: 01/12/2025  END OF SESSION:  PT End of Session - 01/12/25 0941     Visit Number 3    Number of Visits 16    Date for Recertification  03/01/25    Authorization Type Workers Comp Rueben Mirza Case Manager: Cell [phone], Fax [phone]    PT Start Time [phone]    PT Stop Time 1012    PT Time Calculation (min) 41 min    Activity Tolerance Patient tolerated treatment well    Behavior During Therapy [facility] for tasks assessed/performed            Past Medical History:  Diagnosis Date   Allergy    Arthritis    right knee   Esophageal reflux    Family history of breast cancer    Family history of genetic disease carrier    sister NBN pathogenic variant   Family history of pancreatic cancer    Fatty liver    HLD (hyperlipidemia)    HTN (hypertension)    Morbid obesity (HCC)    Pre-diabetes    Sleep apnea    USES CPAP    Tobacco use disorder    Unspecified sleep apnea    Past Surgical History:  Procedure Laterality Date   COLONOSCOPY  2013   COLONOSCOPY WITH PROPOFOL  N/A 05/20/2018   Procedure: COLONOSCOPY WITH PROPOFOL ;  Surgeon: Albertus Gordy HERO, MD;  Location: [facility];  Service: Gastroenterology;  Laterality: N/A;   KNEE ARTHROSCOPY     left   TOTAL KNEE ARTHROPLASTY Right 10/12/2022   Procedure: RIGHT TOTAL KNEE ARTHROPLASTY;  Surgeon: Vernetta Lonni GRADE, MD;  Location: [facility];  Service: Orthopedics;  Laterality: Right;   TOTAL KNEE ARTHROPLASTY Left 12/11/2024   Procedure: ARTHROPLASTY, KNEE, TOTAL;  Surgeon: Vernetta Lonni GRADE, MD;  Location: [facility];  Service: Orthopedics;  Laterality: Left;   Patient Active Problem List   Diagnosis Date Noted   Status post total left knee replacement 12/11/2024   Right kidney mass 03/02/2024   Pyelonephritis 03/02/2024   Urinary urgency 03/02/2024   Vitamin D  deficiency  12/21/2023   Stress reaction 12/20/2023   Encounter for physical examination related to employment 10/02/2023   Status post total right knee replacement 10/12/2022   Current use of proton pump inhibitor 11/20/2021   Genetic testing 09/15/2018   Family history of genetic disease carrier 09/02/2018   Family history of breast cancer    Family history of pancreatic cancer    Prostate cancer screening 08/17/2018   History of colonic polyps    Prediabetes 04/01/2016   Left ankle pain 10/14/2015   PVC's (premature ventricular contractions) 10/22/2013   Colon cancer screening 05/23/2012   Routine general medical examination at a health care facility 05/20/2012   Right knee pain 06/08/2011   G E R D 08/04/2007   Hyperlipidemia 07/22/2007   Morbid obesity (HCC) 07/22/2007   SMOKELESS TOBACCO ABUSE 07/22/2007   Hypertension, essential 07/22/2007   OSA (obstructive sleep apnea) 07/22/2007    PCP: Randeen Laine LABOR, MD  REFERRING PROVIDER: Vernetta Lonni GRADE, MD  REFERRING DIAG: Lt TKA  Rationale for Evaluation and Treatment: Rehabilitation  THERAPY DIAG:  Acute pain of left knee  Stiffness of left knee, not elsewhere classified  Localized edema  Other abnormalities of gait and mobility  Unsteadiness on feet  ONSET DATE: DOS: 12/11/24   SUBJECTIVE:  SUBJECTIVE STATEMENT:  Knee is more stiff today but able to walk without cane or anything! Straightening it out is hard for me, bending is coming along. When I sit in a chair I can pull both feet back no issue but laying down I can't seem to bend it as much, its frustrating.      Eval: Pt is s/p Lt TKA on 12/11/24.  He is walking with [facility] today.  He had HHPT x 5 visits.  PERTINENT HISTORY:  Morbid obesity, OA, HTN, OSA   PAIN:  Are you  having pain? Yes: NPRS scale: 4/10 this morning  Pain location: Lt knee Pain description: toothache Aggravating factors: stairs, walking longer distances, trying to increase flexion Relieving factors: repositioning, rest, uses floor bike 3-4 times a day, ice  PRECAUTIONS:  None  RED FLAGS: None   WEIGHT BEARING RESTRICTIONS:  No  FALLS:  Has patient fallen in last 6 months? 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, play with dogs, golf, no regular exercise  PATIENT GOALS:  Regain function of Lt knee, improve pain   OBJECTIVE:  Note: Objective measures were completed at Evaluation unless otherwise noted.  PATIENT SURVEYS:  Patient-Specific Activity Scoring Scheme  0 represents unable to perform. 10 represents able to perform at prior level. 0 1 2 3 4 5 6 7 8 9  10 (Date and Score)   Activity Eval     1. Stairs 5    2. Walking long distances 7    Score 6    Total score = sum of the activity scores/number of activities Minimum detectable change (90%CI) for average score = 2 points Minimum detectable change (90%CI) for single activity score = 3 points    COGNITIVE STATUS: Within functional limits for tasks assessed   SENSATION: WFL  GAIT: 01/04/2025 Distance walked: 200' within clinic Assistive device utilized: Quad cane small base Level of assistance: Modified independence Comments: recommend SPC, decreased stance on Lt; decreased Lt hip/knee flexion   EDEMA: 01/04/2025  Not formally measured due to pants; visible swelling in Lt knee and thigh noted   LOWER EXTREMITY ROM:     ROM Right eval Left eval Left 01/12/25  Knee flexion  A: 96 P: 102 AROM seated 98*, AAROM 104*  Knee extension A: 3  hyper (seated LAQ) A: -5 (seated LAQ) P: -2 11* AROM    (Blank rows = not tested)   LOWER EXTREMITY MMT:   01/04/25: Not formally tested at eval; Lt knee grossly 3-/5  MMT Right eval Left eval  Hip flexion    Hip extension    Hip abduction    Hip adduction    Hip internal rotation    Hip external rotation    Knee flexion    Knee extension    Ankle dorsiflexion    Ankle plantarflexion    Ankle inversion    Ankle eversion     (Blank rows = not tested)      TREATMENT:  DATE:   01/12/25  Scifit bike seat 10-->8 full rotations x8 minutes ROM update (see chart above)  Patella mobs Percussion gun L HS sidelying   Knee extension stretch with 2.5# x3 minutes, supine with heel prop HS stretches 3x30 seconds L  TKEs L 10x3 seconds  Standing HS stretch 2x30 seconds L on 6 inch box- able to get to 1* extension after HS work       01/06/25  Scifit bike seat 10 -->8  x8 min for ROM and w/u   Self-AAROM for flexion 12x3 second holds SAQs 3# 2x12  Bridges with progressive knee flexion 3x5 (2 rounds) LAQs 3# x12  Percussion gun to quad with LEs in elevation (avoided bruising medial thigh)  Vaso to LLE in elevation x10 min        01/04/2025 TherEx See HEP - demonstrated with trial reps performed PRN, mod cues for comprehension    Self Care Educated on PT POC and clinical findings     PATIENT EDUCATION:  Education details: HEP Person educated: Patient and father-in-law Education method: Explanation, Demonstration, and Handouts Education comprehension: verbalized understanding, returned demonstration, and needs further education  HOME EXERCISE PROGRAM: Access Code: [email or web address] URL: [email or web address] Date: 01/04/2025 Prepared by: Corean Ku  Exercises - Long Sitting Quad Set with Towel Roll Under Heel  - 5-10 x  daily - 7 x weekly - 1 sets - 10 reps - 5 sec hold - Supine Heel Slide with Strap  - 5-10 x daily - 7 x weekly - 1 sets - 10 reps - Seated Knee Extension AROM  - 3-5 x daily - 7 x weekly - 1 sets - 10 reps - 5 sec hold - Seated Knee Extension AROM  - 3-5 x daily - 7 x weekly - 1 sets - 10 reps - 10 sec hold - Seated Straight Leg Raise   - 3-5 x daily - 7 x weekly - 1 sets - 10 reps - 5 sec hold   ASSESSMENT:  CLINICAL IMPRESSION:  Arrives today doing well, his flexion is coming along but he is concerned about his ability to straighten it completely. See ROM updates as above. Continued working on knee range of motion and functional strengthening as appropriate and tolerated. HS definitely limited extension, we worked on this group quite a bit today to counter this.    EVAL: Patient is a 64 y.o. male who was seen today for physical therapy evaluation and treatment for Lt TKA. He demonstrates decreased ROM, strength and balance, as well as gait abnormalities and expected post op pain and swelling affecting functional mobility.  He will benefit from PT to address deficits listed.     OBJECTIVE IMPAIRMENTS: Abnormal gait, decreased activity tolerance, decreased balance, decreased mobility, difficulty walking, decreased ROM, decreased strength, increased muscle spasms, impaired flexibility, and pain.   ACTIVITY LIMITATIONS: carrying, lifting, sitting, standing, squatting, sleeping, stairs, transfers, bed mobility, hygiene/grooming, and locomotion level  PARTICIPATION LIMITATIONS: meal prep, cleaning, laundry, driving, shopping, community activity, occupation, and yard work  PERSONAL FACTORS: Age, Behavior pattern, Past/current experiences, Time since onset of injury/illness/exacerbation, and 3+ comorbidities: Morbid obesity, OA, HTN, OSA, Rt TKA are also affecting patient's functional outcome.   REHAB POTENTIAL: Good  CLINICAL DECISION MAKING: Stable/uncomplicated  EVALUATION COMPLEXITY:  Low   GOALS: Goals reviewed with patient? Yes  SHORT TERM GOALS: Target date: 02/01/2025  Independent with initial HEP Goal status: INITIAL  2.  Lt knee AROM improved 0-100 for improved mobility and function Goal status: INITIAL  LONG TERM GOALS: Target date: 03/01/2025   Independent with final HEP Goal status: INITIAL  2.  PSFS score improved by 2 points Goal status: INITIAL  3.  Lt knee AROM improved to 0-110 for improved function and mobility Goal status: INIITAL  4.  Report pain < 3/10 with standing and walking activities for improved function Goal status: INITIAL  5.  Amb without AD independently and without significant deviations for improved function and mobility Goal status: INITIAL  6.  Negotiate stairs with single handrail reciprocally with pain <2/10 Goal status: INITIAL     PLAN:  PT FREQUENCY: 2x/week  PT DURATION: 8 weeks  PLANNED INTERVENTIONS: 97164- PT Re-evaluation, 97750- Physical Performance Testing, 97110-Therapeutic exercises, 97530- Therapeutic activity, 97112- Neuromuscular re-education, 97535- Self Care, 02859- Manual therapy, [phone]- Gait training, [phone]- Aquatic Therapy, [phone]- Electrical stimulation (unattended), 97016- Vasopneumatic device, 20560 (1-2 muscles), 20561 (3+ muscles)- Dry Needling, Patient/Family education, Balance training, Stair training, Taping, Joint mobilization, Joint manipulation, DME instructions, Cryotherapy, and Moist heat.  PLAN FOR NEXT SESSION: HEP updates PRN, continue extension focus/quad activation, transition to [facility] and eventually no device. Continue percussion gun if this was helpful. Manual as desired/beneficial    NEXT MD VISIT: 01/21/24  Josette Rough, PT, DPT 01/12/2025 10:13 AM     "

## 2025-01-13 NOTE — Therapy (Signed)
 " OUTPATIENT PHYSICAL THERAPY TREATMENT    Patient Name: Charles Morales MRN: [id number] DOB:10-Nov-1961, 64 y.o., male Today's Date: 01/14/2025  END OF SESSION:  PT End of Session - 01/14/25 1009     Visit Number 4    Number of Visits 16    Date for Recertification  03/01/25    Authorization Type Workers Comp Rueben Mirza Case Manager: Cell [phone], Fax [phone]    PT Start Time 0930    PT Stop Time 1018    PT Time Calculation (min) 48 min    Activity Tolerance Patient tolerated treatment well    Behavior During Therapy WFL for tasks assessed/performed             Past Medical History:  Diagnosis Date   Allergy    Arthritis    right knee   Esophageal reflux    Family history of breast cancer    Family history of genetic disease carrier    sister NBN pathogenic variant   Family history of pancreatic cancer    Fatty liver    HLD (hyperlipidemia)    HTN (hypertension)    Morbid obesity (HCC)    Pre-diabetes    Sleep apnea    USES CPAP    Tobacco use disorder    Unspecified sleep apnea    Past Surgical History:  Procedure Laterality Date   COLONOSCOPY  2013   COLONOSCOPY WITH PROPOFOL  N/A 05/20/2018   Procedure: COLONOSCOPY WITH PROPOFOL ;  Surgeon: Albertus Gordy HERO, MD;  Location: [facility];  Service: Gastroenterology;  Laterality: N/A;   KNEE ARTHROSCOPY     left   TOTAL KNEE ARTHROPLASTY Right 10/12/2022   Procedure: RIGHT TOTAL KNEE ARTHROPLASTY;  Surgeon: Vernetta Lonni GRADE, MD;  Location: [facility];  Service: Orthopedics;  Laterality: Right;   TOTAL KNEE ARTHROPLASTY Left 12/11/2024   Procedure: ARTHROPLASTY, KNEE, TOTAL;  Surgeon: Vernetta Lonni GRADE, MD;  Location: [facility];  Service: Orthopedics;  Laterality: Left;   Patient Active Problem List   Diagnosis Date Noted   Status post total left knee replacement 12/11/2024   Right kidney mass 03/02/2024   Pyelonephritis 03/02/2024   Urinary urgency 03/02/2024   Vitamin D  deficiency  12/21/2023   Stress reaction 12/20/2023   Encounter for physical examination related to employment 10/02/2023   Status post total right knee replacement 10/12/2022   Current use of proton pump inhibitor 11/20/2021   Genetic testing 09/15/2018   Family history of genetic disease carrier 09/02/2018   Family history of breast cancer    Family history of pancreatic cancer    Prostate cancer screening 08/17/2018   History of colonic polyps    Prediabetes 04/01/2016   Left ankle pain 10/14/2015   PVC's (premature ventricular contractions) 10/22/2013   Colon cancer screening 05/23/2012   Routine general medical examination at a health care facility 05/20/2012   Right knee pain 06/08/2011   G E R D 08/04/2007   Hyperlipidemia 07/22/2007   Morbid obesity (HCC) 07/22/2007   SMOKELESS TOBACCO ABUSE 07/22/2007   Hypertension, essential 07/22/2007   OSA (obstructive sleep apnea) 07/22/2007    PCP: Randeen Laine LABOR, MD  REFERRING PROVIDER: Vernetta Lonni GRADE, MD  REFERRING DIAG: Lt TKA  Rationale for Evaluation and Treatment: Rehabilitation  THERAPY DIAG:  Acute pain of left knee  Stiffness of left knee, not elsewhere classified  Localized edema  Other abnormalities of gait and mobility  Unsteadiness on feet  ONSET DATE: DOS: 12/11/24   SUBJECTIVE:  SUBJECTIVE STATEMENT:  Knee feels stiff but mobile.   Eval: Pt is s/p Lt TKA on 12/11/24.  He is walking with [facility] today.  He had HHPT x 5 visits.  PERTINENT HISTORY:  Morbid obesity, OA, HTN, OSA   PAIN:  Are you having pain? Yes: NPRS scale: 2/10 this morning  Pain location: Lt knee Pain description: toothache Aggravating factors: stairs, walking longer distances, trying to increase flexion Relieving factors: repositioning, rest, uses  floor bike 3-4 times a day, ice  PRECAUTIONS:  None  RED FLAGS: None   WEIGHT BEARING RESTRICTIONS:  No  FALLS:  Has patient fallen in last 6 months? 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, play with dogs, golf, no regular exercise  PATIENT GOALS:  Regain function of Lt knee, improve pain   OBJECTIVE:  Note: Objective measures were completed at Evaluation unless otherwise noted.  PATIENT SURVEYS:  Patient-Specific Activity Scoring Scheme  0 represents unable to perform. 10 represents able to perform at prior level. 0 1 2 3 4 5 6 7 8 9  10 (Date and Score)   Activity Eval     1. Stairs 5    2. Walking long distances 7    Score 6    Total score = sum of the activity scores/number of activities Minimum detectable change (90%CI) for average score = 2 points Minimum detectable change (90%CI) for single activity score = 3 points    COGNITIVE STATUS: Within functional limits for tasks assessed   SENSATION: WFL  GAIT: 01/04/2025 Distance walked: 200' within clinic Assistive device utilized: Quad cane small base Level of assistance: Modified independence Comments: recommend SPC, decreased stance on Lt; decreased Lt hip/knee flexion   EDEMA: 01/04/2025  Not formally measured due to pants; visible swelling in Lt knee and thigh noted   LOWER EXTREMITY ROM:     ROM Right eval Left eval Left 01/12/25 Left 01/14/25  Knee flexion  A: 96 P: 102 AROM seated 98*, AAROM 104* Supine A 101  Knee extension A: 3 hyper (seated LAQ) A: -5 (seated LAQ) P: -2 11* AROM  Supine  +4>0 A + 2>0 P   (Blank rows = not tested)   LOWER EXTREMITY MMT:   01/04/25: Not formally tested at eval; Lt knee grossly 3-/5  MMT  Right eval Left eval  Hip flexion    Hip extension    Hip abduction    Hip adduction    Hip internal rotation    Hip external rotation    Knee flexion    Knee extension    Ankle dorsiflexion    Ankle plantarflexion    Ankle inversion    Ankle eversion     (Blank rows = not tested)      TREATMENT:    L TKA  DATE:  01/14/25 Sci fit at seat 10 2 min seat 9 then seat 8  Squats 2x10 Standing heel raises 2x10 TKE with TB green 2x10 Incline board stretch 3x25 sec LAQ 4x10 3# Seated SLR 3x10 1# Quad sets with towel under ankle 20x  Manual for flexion and ext gains, hamstring stretching Vaso to LLE in elevation med compression at 34 degreesx10 min   01/12/25  Scifit bike seat 10-->8 full rotations x8 minutes ROM update (see chart above)  Patella mobs Percussion gun L HS sidelying   Knee extension stretch with 2.5# x3 minutes, supine with heel prop HS stretches 3x30 seconds L  TKEs L 10x3 seconds  Standing HS stretch 2x30 seconds L on 6 inch box- able to get to 1* extension after HS work    01/06/25  Scifit bike seat 10 -->8  x8 min for ROM and w/u   Self-AAROM for flexion 12x3 second holds SAQs 3# 2x12  Bridges with progressive knee flexion 3x5 (2 rounds) LAQs 3# x12  Percussion gun to quad with LEs in elevation (avoided bruising medial thigh)  Vaso to LLE in elevation x10 min   PATIENT EDUCATION:  Education details: HEP Person educated: Patient and father-in-law Education method: Explanation, Demonstration, and Handouts Education comprehension: verbalized understanding, returned demonstration, and needs further education  HOME EXERCISE PROGRAM: Access Code: [email or web address] URL: [email or web address] Date: 01/04/2025 Prepared by: Corean Ku  Exercises - Long Sitting Quad Set with Towel Roll Under Heel  - 5-10 x daily - 7  x weekly - 1 sets - 10 reps - 5 sec hold - Supine Heel Slide with Strap  - 5-10 x daily - 7 x weekly - 1 sets - 10 reps - Seated Knee Extension AROM  - 3-5 x daily - 7 x weekly - 1 sets - 10 reps - 5 sec hold - Seated Knee Extension AROM  - 3-5 x daily - 7 x weekly - 1 sets - 10 reps - 10 sec hold - Seated Straight Leg Raise   - 3-5 x daily - 7 x weekly - 1 sets - 10 reps - 5 sec hold   ASSESSMENT:  CLINICAL IMPRESSION: Pt demonstrates increased AROM.  See assessment above.  Less hamstring tenderness.   EVAL: Patient is a 64 y.o. male who was seen today for physical therapy evaluation and treatment for Lt TKA. He demonstrates decreased ROM, strength and balance, as well as gait abnormalities and expected post op pain and swelling affecting functional mobility.  He will benefit from PT to address deficits listed.     OBJECTIVE IMPAIRMENTS: Abnormal gait, decreased activity tolerance, decreased balance, decreased mobility, difficulty walking, decreased ROM, decreased strength, increased muscle spasms, impaired flexibility, and pain.   ACTIVITY LIMITATIONS: carrying, lifting, sitting, standing, squatting, sleeping, stairs, transfers, bed mobility, hygiene/grooming, and locomotion level  PARTICIPATION LIMITATIONS: meal prep, cleaning, laundry, driving, shopping, community activity, occupation, and yard work  PERSONAL FACTORS: Age, Behavior pattern, Past/current experiences, Time since onset of injury/illness/exacerbation, and 3+ comorbidities: Morbid obesity, OA, HTN, OSA, Rt TKA are also affecting patient's functional outcome.   REHAB POTENTIAL: Good  CLINICAL DECISION MAKING: Stable/uncomplicated  EVALUATION COMPLEXITY: Low   GOALS: Goals reviewed with patient? Yes  SHORT TERM GOALS: Target date: 02/01/2025  Independent with initial HEP Goal status: MET 01/15/25  2.  Lt knee AROM improved 0-100 for improved mobility and function Goal status: INITIAL  LONG TERM GOALS: Ongoing  01/14/25   Independent with final HEP Goal status: INITIAL  2.  PSFS score improved by 2 points Goal status: INITIAL  3.  Lt knee AROM improved to 0-110 for improved function and mobility Goal status: INIITAL  4.  Report pain < 3/10 with standing and walking activities for improved function Goal status: INITIAL  5.  Amb without AD independently and without significant deviations for improved function and mobility Goal status: INITIAL  6.  Negotiate stairs with single handrail reciprocally with pain <2/10 Goal status: INITIAL     PLAN:  PT FREQUENCY: 2x/week  PT DURATION: 8 weeks  PLANNED INTERVENTIONS: 97164- PT Re-evaluation, 97750- Physical Performance Testing, 97110-Therapeutic exercises, 97530- Therapeutic activity, 97112- Neuromuscular re-education, 97535- Self Care, 02859- Manual therapy, [phone]- Gait training, [phone]- Aquatic Therapy, [phone]- Electrical stimulation (unattended), 97016- Vasopneumatic device, 20560 (1-2 muscles), 20561 (3+ muscles)- Dry Needling, Patient/Family education, Balance training, Stair training, Taping, Joint mobilization, Joint manipulation, DME instructions, Cryotherapy, and Moist heat.  PLAN FOR NEXT SESSION: HEP updates PRN, continue extension focus/quad activation, transition to [facility] and eventually no device. Continue percussion gun if this was helpful. Manual as desired/beneficial    NEXT MD VISIT: 01/21/24 Burnard Meth, PT 01/14/25  10:13 AM     "

## 2025-01-14 ENCOUNTER — Other Ambulatory Visit: Payer: Self-pay | Admitting: Orthopaedic Surgery

## 2025-01-14 ENCOUNTER — Ambulatory Visit

## 2025-01-14 DIAGNOSIS — M25662 Stiffness of left knee, not elsewhere classified: Secondary | ICD-10-CM | POA: Diagnosis not present

## 2025-01-14 DIAGNOSIS — R600 Localized edema: Secondary | ICD-10-CM

## 2025-01-14 DIAGNOSIS — M25562 Pain in left knee: Secondary | ICD-10-CM

## 2025-01-14 DIAGNOSIS — R2689 Other abnormalities of gait and mobility: Secondary | ICD-10-CM | POA: Diagnosis not present

## 2025-01-14 DIAGNOSIS — R2681 Unsteadiness on feet: Secondary | ICD-10-CM

## 2025-01-19 ENCOUNTER — Encounter: Payer: Self-pay | Admitting: Physical Therapy

## 2025-01-19 ENCOUNTER — Ambulatory Visit: Admitting: Physical Therapy

## 2025-01-19 DIAGNOSIS — M25562 Pain in left knee: Secondary | ICD-10-CM

## 2025-01-19 DIAGNOSIS — R600 Localized edema: Secondary | ICD-10-CM | POA: Diagnosis not present

## 2025-01-19 DIAGNOSIS — R2689 Other abnormalities of gait and mobility: Secondary | ICD-10-CM

## 2025-01-19 DIAGNOSIS — M25662 Stiffness of left knee, not elsewhere classified: Secondary | ICD-10-CM | POA: Diagnosis not present

## 2025-01-19 DIAGNOSIS — R2681 Unsteadiness on feet: Secondary | ICD-10-CM

## 2025-01-19 NOTE — Therapy (Signed)
 " OUTPATIENT PHYSICAL THERAPY TREATMENT    Patient Name: Charles Morales MRN: [id number] DOB:07/29/1961, 64 y.o., male Today's Date: 01/19/2025  END OF SESSION:  PT End of Session - 01/19/25 1016     Visit Number 5    Number of Visits 16    Date for Recertification  03/01/25    Authorization Type Workers Comp Rueben Mirza Case Manager: Cell [phone], Fax [phone]    PT Start Time 1016    PT Stop Time 1058    PT Time Calculation (min) 42 min    Activity Tolerance Patient tolerated treatment well    Behavior During Therapy WFL for tasks assessed/performed              Past Medical History:  Diagnosis Date   Allergy    Arthritis    right knee   Esophageal reflux    Family history of breast cancer    Family history of genetic disease carrier    sister NBN pathogenic variant   Family history of pancreatic cancer    Fatty liver    HLD (hyperlipidemia)    HTN (hypertension)    Morbid obesity (HCC)    Pre-diabetes    Sleep apnea    USES CPAP    Tobacco use disorder    Unspecified sleep apnea    Past Surgical History:  Procedure Laterality Date   COLONOSCOPY  2013   COLONOSCOPY WITH PROPOFOL  N/A 05/20/2018   Procedure: COLONOSCOPY WITH PROPOFOL ;  Surgeon: Albertus Gordy HERO, MD;  Location: [facility];  Service: Gastroenterology;  Laterality: N/A;   KNEE ARTHROSCOPY     left   TOTAL KNEE ARTHROPLASTY Right 10/12/2022   Procedure: RIGHT TOTAL KNEE ARTHROPLASTY;  Surgeon: Vernetta Lonni GRADE, MD;  Location: [facility];  Service: Orthopedics;  Laterality: Right;   TOTAL KNEE ARTHROPLASTY Left 12/11/2024   Procedure: ARTHROPLASTY, KNEE, TOTAL;  Surgeon: Vernetta Lonni GRADE, MD;  Location: [facility];  Service: Orthopedics;  Laterality: Left;   Patient Active Problem List   Diagnosis Date Noted   Status post total left knee replacement 12/11/2024   Right kidney mass 03/02/2024   Pyelonephritis 03/02/2024   Urinary urgency 03/02/2024   Vitamin D  deficiency  12/21/2023   Stress reaction 12/20/2023   Encounter for physical examination related to employment 10/02/2023   Status post total right knee replacement 10/12/2022   Current use of proton pump inhibitor 11/20/2021   Genetic testing 09/15/2018   Family history of genetic disease carrier 09/02/2018   Family history of breast cancer    Family history of pancreatic cancer    Prostate cancer screening 08/17/2018   History of colonic polyps    Prediabetes 04/01/2016   Left ankle pain 10/14/2015   PVC's (premature ventricular contractions) 10/22/2013   Colon cancer screening 05/23/2012   Routine general medical examination at a health care facility 05/20/2012   Right knee pain 06/08/2011   G E R D 08/04/2007   Hyperlipidemia 07/22/2007   Morbid obesity (HCC) 07/22/2007   SMOKELESS TOBACCO ABUSE 07/22/2007   Hypertension, essential 07/22/2007   OSA (obstructive sleep apnea) 07/22/2007    PCP: Randeen Laine LABOR, MD  REFERRING PROVIDER: Vernetta Lonni GRADE, MD  REFERRING DIAG: Lt TKA  Rationale for Evaluation and Treatment: Rehabilitation  THERAPY DIAG:  Acute pain of left knee  Stiffness of left knee, not elsewhere classified  Localized edema  Other abnormalities of gait and mobility  Unsteadiness on feet  ONSET DATE: DOS: 12/11/24   SUBJECTIVE:  SUBJECTIVE STATEMENT:  Knee's a little more sore with the cold today but doesn't hurt but just aches- have been doing HEP at home OK.    Eval: Pt is s/p Lt TKA on 12/11/24.  He is [facility] with [facility] today.  He had HHPT x 5 visits.  PERTINENT HISTORY:  Morbid obesity, OA, HTN, OSA   PAIN:  Are you having pain? Yes: NPRS scale: 2/10 this morning  Pain location: Lt knee Pain description: toothache, sometimes pinches L lateral knee   Aggravating factors: stairs, walking longer distances, trying to increase flexion Relieving factors: repositioning, rest, uses floor bike 3-4 times a day, ice  PRECAUTIONS:  None  RED FLAGS: None   WEIGHT BEARING RESTRICTIONS:  No  FALLS:  Has patient fallen in last 6 months? 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, play with dogs, golf, no regular exercise  PATIENT GOALS:  Regain function of Lt knee, improve pain   OBJECTIVE:  Note: Objective measures were completed at Evaluation unless otherwise noted.  PATIENT SURVEYS:  Patient-Specific Activity Scoring Scheme  0 represents unable to perform. 10 represents able to perform at prior level. 0 1 2 3 4 5 6 7 8 9  10 (Date and Score)   Activity Eval   01/19/25  1. Stairs 5  9  2. Walking long distances 7 9   Score 6 9   Total score = sum of the activity scores/number of activities Minimum detectable change (90%CI) for average score = 2 points Minimum detectable change (90%CI) for single activity score = 3 points    COGNITIVE STATUS: Within functional limits for tasks assessed   SENSATION: WFL  GAIT: 01/04/2025 Distance walked: 200' within clinic Assistive device utilized: Quad cane small base Level of assistance: Modified independence Comments: recommend SPC, decreased stance on Lt; decreased Lt hip/knee flexion   EDEMA: 01/04/2025  Not formally measured due to pants; visible swelling in Lt knee and thigh noted   LOWER EXTREMITY ROM:     ROM Right eval Left eval Left 01/12/25 Left 01/14/25 Left 01/19/25 AROM  Knee flexion  A: 96 P: 102 AROM seated 98*, AAROM 104* Supine A 101 96* seated  106* AAROM supine  Knee extension A: 3 hyper (seated  LAQ) A: -5 (seated LAQ) P: -2 11* AROM  Supine  +4>0 A + 2>0 P 12* seated LAQ  5* supine quad set with heel prop     (Blank rows = not tested)   LOWER EXTREMITY MMT:   01/04/25: Not formally tested at eval; Lt knee grossly 3-/5  MMT Right eval Left eval R 01/19/25 L 01/19/25  Hip flexion      Hip extension      Hip abduction      Hip adduction      Hip internal rotation      Hip external rotation      Knee flexion   4+ 4+  Knee extension   4+ 4+  Ankle dorsiflexion      Ankle plantarflexion      Ankle inversion      Ankle eversion       (Blank rows = not tested)      TREATMENT:    L TKA  DATE:   01/19/25  Scifit seat 10-->7 x8 min total, full rotations for ROM and w/u  Shuttle BLE press 75# with holds into flexion and extension x12 Shuttle L LE press 37# x12  ROM, MMT, discussed steps, PSFS, goals in prep for MD visit tomorrow  Patella mobs all directions  Knee extension OP supine HS stretches supine 2x30 seconds L  Percussion gun distal hamstrings and proximal calf sidelying          01/14/25 Sci fit at seat 10 2 min seat 9 then seat 8  Squats 2x10 Standing heel raises 2x10 TKE with TB green 2x10 Incline board stretch 3x25 sec LAQ 4x10 3# Seated SLR 3x10 1# Quad sets with towel under ankle 20x  Manual for flexion and ext gains, hamstring stretching Vaso to LLE in elevation med compression at 34 degreesx10 min   01/12/25  Scifit bike seat 10-->8 full rotations x8 minutes ROM update (see chart above)  Patella mobs Percussion gun L HS sidelying   Knee extension stretch with 2.5# x3 minutes, supine with heel prop HS stretches 3x30 seconds L  TKEs L 10x3 seconds  Standing HS stretch 2x30 seconds L on 6 inch box- able to get to 1* extension after HS work    01/06/25  Scifit bike seat 10 -->8  x8 min for ROM and w/u    Self-AAROM for flexion 12x3 second holds SAQs 3# 2x12  Bridges with progressive knee flexion 3x5 (2 rounds) LAQs 3# x12  Percussion gun to quad with LEs in elevation (avoided bruising medial thigh)  Vaso to LLE in elevation x10 min   PATIENT EDUCATION:  Education details: HEP Person educated: Patient and father-in-law Education method: Explanation, Demonstration, and Handouts Education comprehension: verbalized understanding, returned demonstration, and needs further education  HOME EXERCISE PROGRAM: Access Code: [email or web address] URL: [email or web address] Date: 01/04/2025 Prepared by: Corean Ku  Exercises - Long Sitting Quad Set with Towel Roll Under Heel  - 5-10 x daily - 7 x weekly - 1 sets - 10 reps - 5 sec hold - Supine Heel Slide with Strap  - 5-10 x daily - 7 x weekly - 1 sets - 10 reps - Seated Knee Extension AROM  - 3-5 x daily - 7 x weekly - 1 sets - 10 reps - 5 sec hold - Seated Knee Extension AROM  - 3-5 x daily - 7 x weekly - 1 sets - 10 reps - 10 sec hold - Seated Straight Leg Raise   - 3-5 x daily - 7 x weekly - 1 sets - 10 reps - 5 sec hold   ASSESSMENT:  CLINICAL IMPRESSION:    Arrived today doing OK, updated objectives and PSFS as above in prep for MD appt tomorrow. Making reasonable progress with PT but does continue to demonstrate ongoing knee stiffness as expected post-op. Motivated to improve, will continue to challenge as appropriate here in PT.    EVAL: Patient is a 64 y.o. male who was seen today for physical therapy evaluation and treatment for Lt TKA. He demonstrates decreased ROM, strength and balance, as well as gait abnormalities and expected post op pain and swelling affecting functional mobility.  He will benefit from PT to address deficits listed.     OBJECTIVE IMPAIRMENTS: Abnormal gait, decreased activity tolerance, decreased balance, decreased mobility, difficulty walking, decreased ROM, decreased strength, increased  muscle spasms, impaired flexibility, and pain.   ACTIVITY LIMITATIONS: carrying, lifting, sitting, standing, squatting, sleeping, stairs, transfers, bed mobility, hygiene/grooming, and locomotion level  PARTICIPATION LIMITATIONS: meal prep, cleaning, laundry, driving, shopping, community activity, occupation, and yard work  PERSONAL FACTORS: Age, Behavior pattern, Past/current experiences, Time since onset of injury/illness/exacerbation, and 3+ comorbidities: Morbid obesity, OA, HTN, OSA, Rt TKA are also affecting patient's functional outcome.   REHAB POTENTIAL: Good  CLINICAL DECISION MAKING: Stable/uncomplicated  EVALUATION COMPLEXITY: Low   GOALS: Goals reviewed with patient? Yes  SHORT TERM GOALS: Target date: 02/01/2025  Independent with initial HEP Goal status: MET 01/15/25  2.  Lt knee AROM improved 0-100 for improved mobility and function Goal status: ONGOING 01/19/25  LONG TERM GOALS: Ongoing 01/14/25   Independent with final HEP Goal status: ONGOING 01/19/25  2.  PSFS score improved by 2 points Goal status: MET 01/19/25  3.  Lt knee AROM improved to 0-110 for improved function and mobility Goal status: ONGOING 01/19/25  4.  Report pain < 3/10 with standing and walking activities for improved function Goal status: PARTIALLY MET 01/19/25 (pain 2/10 usually, but ache can get to 4-5/10)  5.  Amb without AD independently and without significant deviations for improved function and mobility Goal status: ONGOING 01/19/25  6.  Negotiate stairs with single handrail reciprocally with pain <2/10 Goal status: ONGOING 01/19/25      PLAN:  PT FREQUENCY: 2x/week  PT DURATION: 8 weeks  PLANNED INTERVENTIONS: 97164- PT Re-evaluation, 97750- Physical Performance Testing, 97110-Therapeutic exercises, 97530- Therapeutic activity, 97112- Neuromuscular re-education, 97535- Self Care, 02859- Manual therapy, [phone]- Gait training, [phone]- Aquatic Therapy, [phone]- Electrical stimulation  (unattended), 97016- Vasopneumatic device, [phone] (1-2 muscles), 20561 (3+ muscles)- Dry Needling, Patient/Family education, Balance training, Stair training, Taping, Joint mobilization, Joint manipulation, DME instructions, Cryotherapy, and Moist heat.  PLAN FOR NEXT SESSION: HEP updates PRN, continue extension focus/quad activation, transition to [facility] and eventually no device. Continue percussion gun if this was helpful. Manual as desired. How was MD visit?    NEXT MD VISIT: 01/21/24   Josette Rough, PT, DPT 01/19/25 11:01 AM     "

## 2025-01-20 ENCOUNTER — Ambulatory Visit: Admitting: Orthopaedic Surgery

## 2025-01-20 ENCOUNTER — Encounter: Payer: Self-pay | Admitting: Orthopaedic Surgery

## 2025-01-20 DIAGNOSIS — Z96652 Presence of left artificial knee joint: Secondary | ICD-10-CM

## 2025-01-20 NOTE — Progress Notes (Signed)
 The patient is here today in continued follow-up as a relates to his recent left knee replacement.  This was 6 weeks ago.  He has been in outpatient physical therapy working on getting his knee flexing and extending.  We did replace his right knee back in October 2023.  He reports that he is doing well with physical therapy and has been able to flex his knee to 106 degrees.  On exam he does lack full extension by just a few degrees and his flexion is to past 90 degrees today.  His incision looks good.  There is a small area that I am going to have him place mupirocin ointment on daily after he showers.  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  We will see him back in 4 weeks to see how he is doing overall but no x-rays are needed at that visit can hopefully release him back to his regular work duties.

## 2025-01-20 NOTE — Therapy (Signed)
 " OUTPATIENT PHYSICAL THERAPY TREATMENT    Patient Name: JOHNMARK GEIGER MRN: [id number] DOB:1961/07/14, 64 y.o., male Today's Date: 01/21/2025  END OF SESSION:  PT End of Session - 01/21/25 1427     Visit Number 6    Number of Visits 16    Authorization Type Workers [employer] Rueben Mirza Case Manager: Cell [phone], Fax [phone]    PT Start Time 1345    PT Stop Time 1433    PT Time Calculation (min) 48 min    Activity Tolerance Patient tolerated treatment well    Behavior During Therapy WFL for tasks assessed/performed               Past Medical History:  Diagnosis Date   Allergy    Arthritis    right knee   Esophageal reflux    Family history of breast cancer    Family history of genetic disease carrier    sister NBN pathogenic variant   Family history of pancreatic cancer    Fatty liver    HLD (hyperlipidemia)    HTN (hypertension)    Morbid obesity (HCC)    Pre-diabetes    Sleep apnea    USES CPAP    Tobacco use disorder    Unspecified sleep apnea    Past Surgical History:  Procedure Laterality Date   COLONOSCOPY  2013   COLONOSCOPY WITH PROPOFOL  N/A 05/20/2018   Procedure: COLONOSCOPY WITH PROPOFOL ;  Surgeon: Albertus Gordy HERO, MD;  Location: [facility];  Service: Gastroenterology;  Laterality: N/A;   KNEE ARTHROSCOPY     left   TOTAL KNEE ARTHROPLASTY Right 10/12/2022   Procedure: RIGHT TOTAL KNEE ARTHROPLASTY;  Surgeon: Vernetta Lonni GRADE, MD;  Location: [facility];  Service: Orthopedics;  Laterality: Right;   TOTAL KNEE ARTHROPLASTY Left 12/11/2024   Procedure: ARTHROPLASTY, KNEE, TOTAL;  Surgeon: Vernetta Lonni GRADE, MD;  Location: [facility];  Service: Orthopedics;  Laterality: Left;   Patient Active Problem List   Diagnosis Date Noted   Status post total left knee replacement 12/11/2024   Right kidney mass 03/02/2024   Pyelonephritis 03/02/2024   Urinary urgency 03/02/2024   Vitamin D  deficiency 12/21/2023   Stress reaction 12/20/2023    Encounter for physical examination related to employment 10/02/2023   Status post total right knee replacement 10/12/2022   Current use of proton pump inhibitor 11/20/2021   Genetic testing 09/15/2018   Family history of genetic disease carrier 09/02/2018   Family history of breast cancer    Family history of pancreatic cancer    Prostate cancer screening 08/17/2018   History of colonic polyps    Prediabetes 04/01/2016   Left ankle pain 10/14/2015   PVC's (premature ventricular contractions) 10/22/2013   Colon cancer screening 05/23/2012   Routine general medical examination at a health care facility 05/20/2012   Right knee pain 06/08/2011   G E R D 08/04/2007   Hyperlipidemia 07/22/2007   Morbid obesity (HCC) 07/22/2007   SMOKELESS TOBACCO ABUSE 07/22/2007   Hypertension, essential 07/22/2007   OSA (obstructive sleep apnea) 07/22/2007    PCP: Randeen Laine LABOR, MD  REFERRING PROVIDER: Vernetta Lonni GRADE, MD  REFERRING DIAG: Lt TKA  Rationale for Evaluation and Treatment: Rehabilitation  THERAPY DIAG:  Acute pain of left knee  Stiffness of left knee, not elsewhere classified  Localized edema  Other abnormalities of gait and mobility  Unsteadiness on feet  ONSET DATE: DOS: 12/11/24   SUBJECTIVE:  SUBJECTIVE STATEMENT:  Knee is feeling better with movement. Saw surgeon yesterday and he is happy with progress. Eval: Pt is s/p Lt TKA on 12/11/24.  He is walking with [facility] today.  He had HHPT x 5 visits.  PERTINENT HISTORY:  Morbid obesity, OA, HTN, OSA   PAIN:  Are you having pain? Yes: NPRS scale: 2/10 this morning  Pain location: Lt knee Pain description: toothache, sometimes pinches L lateral knee  Aggravating factors: stairs, walking longer distances, trying to increase  flexion Relieving factors: repositioning, rest, uses floor bike 3-4 times a day, ice  PRECAUTIONS:  None  RED FLAGS: None   WEIGHT BEARING RESTRICTIONS:  No  FALLS:  Has patient fallen in last 6 months? 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, play with dogs, golf, no regular exercise  PATIENT GOALS:  Regain function of Lt knee, improve pain   OBJECTIVE:  Note: Objective measures were completed at Evaluation unless otherwise noted.  PATIENT SURVEYS:  Patient-Specific Activity Scoring Scheme  0 represents unable to perform. 10 represents able to perform at prior level. 0 1 2 3 4 5 6 7 8 9  10 (Date and Score)   Activity Eval   01/19/25  1. Stairs 5  9  2. Walking long distances 7 9   Score 6 9   Total score = sum of the activity scores/number of activities Minimum detectable change (90%CI) for average score = 2 points Minimum detectable change (90%CI) for single activity score = 3 points    COGNITIVE STATUS: Within functional limits for tasks assessed   SENSATION: WFL  GAIT: 01/04/2025 Distance walked: 200' within clinic Assistive device utilized: Quad cane small base Level of assistance: Modified independence Comments: recommend SPC, decreased stance on Lt; decreased Lt hip/knee flexion   EDEMA: 01/04/2025  Not formally measured due to pants; visible swelling in Lt knee and thigh noted   LOWER EXTREMITY ROM:     ROM Right eval Left eval Left 01/12/25 Left 01/14/25 Left 01/19/25 AROM  Knee flexion  A: 96 P: 102 AROM seated 98*, AAROM 104* Supine A 101 96* seated  106* AAROM supine  Knee extension A: 3 hyper (seated LAQ) A: -5 (seated LAQ) P: -2 11* AROM  Supine  +4>0 A + 2>0 P 12* seated  LAQ  5* supine quad set with heel prop     (Blank rows = not tested)   LOWER EXTREMITY MMT:   01/04/25: Not formally tested at eval; Lt knee grossly 3-/5  MMT Right eval Left eval R 01/19/25 L 01/19/25  Hip flexion      Hip extension      Hip abduction      Hip adduction      Hip internal rotation      Hip external rotation      Knee flexion   4+ 4+  Knee extension   4+ 4+  Ankle dorsiflexion      Ankle plantarflexion      Ankle inversion      Ankle eversion       (Blank rows = not tested)      TREATMENT:  L TKA  DATE:  01/21/25 Scifit seat 9-->7 x8 min total, full rotations for ROM and w/u  Leg Press 100# 3x15 bilateral  L #50 2x10 TKE with TB green 2x15 Sit to stand with KB 15#  2x10 Step ups onto single L LE 2x10 4 step  Hurdles forward and lateral LAQ 3# 15x2 Seated SLR 3# 2x10 Vaso 10 med comp 34 degrees  01/19/25  Scifit seat 10-->7 x8 min total, full rotations for ROM and w/u  Shuttle BLE press 75# with holds into flexion and extension x12 Shuttle L LE press 37# x12  ROM, MMT, discussed steps, PSFS, goals in prep for MD visit tomorrow  Patella mobs all directions  Knee extension OP supine HS stretches supine 2x30 seconds L  Percussion gun distal hamstrings and proximal calf sidelying    01/14/25 Sci fit at seat 10 2 min seat 9 then seat 8  Squats 2x10 Standing heel raises 2x10 TKE with TB green 2x10 Incline board stretch 3x25 sec LAQ 4x10 3# Seated SLR 3x10 1# Quad sets with towel under ankle 20x  Manual for flexion and ext gains, hamstring stretching Vaso to LLE in elevation med compression at 34 degreesx10 min   PATIENT EDUCATION:  Education details: HEP Person educated: Patient and father-in-law Education method: Explanation, Demonstration, and Handouts Education comprehension: verbalized understanding, returned  demonstration, and needs further education  HOME EXERCISE PROGRAM: Access Code: [email or web address] URL: [email or web address] Date: 01/04/2025 Prepared by: Corean Ku  Exercises - Long Sitting Quad Set with Towel Roll Under Heel  - 5-10 x daily - 7 x weekly - 1 sets - 10 reps - 5 sec hold - Supine Heel Slide with Strap  - 5-10 x daily - 7 x weekly - 1 sets - 10 reps - Seated Knee Extension AROM  - 3-5 x daily - 7 x weekly - 1 sets - 10 reps - 5 sec hold - Seated Knee Extension AROM  - 3-5 x daily - 7 x weekly - 1 sets - 10 reps - 10 sec hold - Seated Straight Leg Raise   - 3-5 x daily - 7 x weekly - 1 sets - 10 reps - 5 sec hold   ASSESSMENT:  CLINICAL IMPRESSION: Needed HHA for hurdles but no LOB.    EVAL: Patient is a 64 y.o. male who was seen today for physical therapy evaluation and treatment for Lt TKA. He demonstrates decreased ROM, strength and balance, as well as gait abnormalities and expected post op pain and swelling affecting functional mobility.  He will benefit from PT to address deficits listed.     OBJECTIVE IMPAIRMENTS: Abnormal gait, decreased activity tolerance, decreased balance, decreased mobility, difficulty walking, decreased ROM, decreased strength, increased muscle spasms, impaired flexibility, and pain.   ACTIVITY LIMITATIONS: carrying, lifting, sitting, standing, squatting, sleeping, stairs, transfers, bed mobility, hygiene/grooming, and locomotion level  PARTICIPATION LIMITATIONS: meal prep, cleaning, laundry, driving, shopping, community activity, occupation, and yard work  PERSONAL FACTORS: Age, Behavior pattern, Past/current experiences, Time since onset of injury/illness/exacerbation, and 3+ comorbidities: Morbid obesity, OA, HTN, OSA, Rt TKA are also affecting patient's functional outcome.   REHAB POTENTIAL: Good  CLINICAL DECISION MAKING: Stable/uncomplicated  EVALUATION COMPLEXITY: Low   GOALS: Goals reviewed with patient?  Yes  SHORT TERM GOALS: Target date: 02/01/2025  Independent with initial HEP Goal status: MET 01/15/25  2.  Lt knee AROM improved 0-100 for improved mobility and function Goal status: ONGOING 01/19/25  LONG TERM GOALS: Ongoing 01/14/25   Independent with final HEP Goal  status: ONGOING 01/19/25  2.  PSFS score improved by 2 points Goal status: MET 01/19/25  3.  Lt knee AROM improved to 0-110 for improved function and mobility Goal status: ONGOING 01/19/25  4.  Report pain < 3/10 with standing and walking activities for improved function Goal status: PARTIALLY MET 01/19/25 (pain 2/10 usually, but ache can get to 4-5/10)  5.  Amb without AD independently and without significant deviations for improved function and mobility Goal status: ONGOING 01/19/25  6.  Negotiate stairs with single handrail reciprocally with pain <2/10 Goal status: ONGOING 01/19/25      PLAN:  PT FREQUENCY: 2x/week  PT DURATION: 8 weeks  PLANNED INTERVENTIONS: 97164- PT Re-evaluation, 97750- Physical Performance Testing, 97110-Therapeutic exercises, 97530- Therapeutic activity, 97112- Neuromuscular re-education, 97535- Self Care, 02859- Manual therapy, [phone]- Gait training, [phone]- Aquatic Therapy, [phone]- Electrical stimulation (unattended), 97016- Vasopneumatic device, [phone] (1-2 muscles), 20561 (3+ muscles)- Dry Needling, Patient/Family education, Balance training, Stair training, Taping, Joint mobilization, Joint manipulation, DME instructions, Cryotherapy, and Moist heat.  PLAN FOR NEXT SESSION: HEP updates PRN, continue extension focus/quad activation, transition to [facility] and eventually no device. Continue percussion gun if this was helpful. Manual as desired. How was MD visit?   Burnard Meth, PT 01/21/25  2:28 PM      "

## 2025-01-21 ENCOUNTER — Encounter

## 2025-01-21 ENCOUNTER — Ambulatory Visit

## 2025-01-21 DIAGNOSIS — M25662 Stiffness of left knee, not elsewhere classified: Secondary | ICD-10-CM

## 2025-01-21 DIAGNOSIS — R2681 Unsteadiness on feet: Secondary | ICD-10-CM | POA: Diagnosis not present

## 2025-01-21 DIAGNOSIS — R2689 Other abnormalities of gait and mobility: Secondary | ICD-10-CM | POA: Diagnosis not present

## 2025-01-21 DIAGNOSIS — M25562 Pain in left knee: Secondary | ICD-10-CM

## 2025-01-21 DIAGNOSIS — R600 Localized edema: Secondary | ICD-10-CM | POA: Diagnosis not present

## 2025-01-26 ENCOUNTER — Encounter: Payer: Self-pay | Admitting: Physical Therapy

## 2025-01-26 ENCOUNTER — Encounter: Admitting: Physical Therapy

## 2025-01-26 ENCOUNTER — Ambulatory Visit: Admitting: Physical Therapy

## 2025-01-26 DIAGNOSIS — R2689 Other abnormalities of gait and mobility: Secondary | ICD-10-CM | POA: Diagnosis not present

## 2025-01-26 DIAGNOSIS — R600 Localized edema: Secondary | ICD-10-CM

## 2025-01-26 DIAGNOSIS — M25662 Stiffness of left knee, not elsewhere classified: Secondary | ICD-10-CM

## 2025-01-26 DIAGNOSIS — M25562 Pain in left knee: Secondary | ICD-10-CM

## 2025-01-26 DIAGNOSIS — R2681 Unsteadiness on feet: Secondary | ICD-10-CM

## 2025-01-26 NOTE — Therapy (Signed)
 " OUTPATIENT PHYSICAL THERAPY TREATMENT    Patient Name: Charles Morales MRN: [id number] DOB:1961/10/24, 64 y.o., male Today's Date: 01/26/2025  END OF SESSION:  PT End of Session - 01/26/25 1310     Visit Number 7    Number of Visits 16    Date for Recertification  03/01/25    Authorization Type Workers Comp Rueben Mirza Case Manager: Cell [phone], Fax [phone]    PT Start Time 1302    PT Stop Time 1344    PT Time Calculation (min) 42 min    Activity Tolerance Patient tolerated treatment well    Behavior During Therapy WFL for tasks assessed/performed                Past Medical History:  Diagnosis Date   Allergy    Arthritis    right knee   Esophageal reflux    Family history of breast cancer    Family history of genetic disease carrier    sister NBN pathogenic variant   Family history of pancreatic cancer    Fatty liver    HLD (hyperlipidemia)    HTN (hypertension)    Morbid obesity (HCC)    Pre-diabetes    Sleep apnea    USES CPAP    Tobacco use disorder    Unspecified sleep apnea    Past Surgical History:  Procedure Laterality Date   COLONOSCOPY  2013   COLONOSCOPY WITH PROPOFOL  N/A 05/20/2018   Procedure: COLONOSCOPY WITH PROPOFOL ;  Surgeon: Albertus Gordy HERO, MD;  Location: [facility];  Service: Gastroenterology;  Laterality: N/A;   KNEE ARTHROSCOPY     left   TOTAL KNEE ARTHROPLASTY Right 10/12/2022   Procedure: RIGHT TOTAL KNEE ARTHROPLASTY;  Surgeon: Vernetta Lonni GRADE, MD;  Location: [facility];  Service: Orthopedics;  Laterality: Right;   TOTAL KNEE ARTHROPLASTY Left 12/11/2024   Procedure: ARTHROPLASTY, KNEE, TOTAL;  Surgeon: Vernetta Lonni GRADE, MD;  Location: [facility];  Service: Orthopedics;  Laterality: Left;   Patient Active Problem List   Diagnosis Date Noted   Status post total left knee replacement 12/11/2024   Right kidney mass 03/02/2024   Pyelonephritis 03/02/2024   Urinary urgency 03/02/2024   Vitamin D  deficiency  12/21/2023   Stress reaction 12/20/2023   Encounter for physical examination related to employment 10/02/2023   Status post total right knee replacement 10/12/2022   Current use of proton pump inhibitor 11/20/2021   Genetic testing 09/15/2018   Family history of genetic disease carrier 09/02/2018   Family history of breast cancer    Family history of pancreatic cancer    Prostate cancer screening 08/17/2018   History of colonic polyps    Prediabetes 04/01/2016   Left ankle pain 10/14/2015   PVC's (premature ventricular contractions) 10/22/2013   Colon cancer screening 05/23/2012   Routine general medical examination at a health care facility 05/20/2012   Right knee pain 06/08/2011   G E R D 08/04/2007   Hyperlipidemia 07/22/2007   Morbid obesity (HCC) 07/22/2007   SMOKELESS TOBACCO ABUSE 07/22/2007   Hypertension, essential 07/22/2007   OSA (obstructive sleep apnea) 07/22/2007    PCP: Randeen Laine LABOR, MD  REFERRING PROVIDER: Vernetta Lonni GRADE, MD  REFERRING DIAG: Lt TKA  Rationale for Evaluation and Treatment: Rehabilitation  THERAPY DIAG:  Acute pain of left knee  Stiffness of left knee, not elsewhere classified  Localized edema  Other abnormalities of gait and mobility  Unsteadiness on feet  ONSET DATE: DOS: 12/11/24   SUBJECTIVE:  SUBJECTIVE STATEMENT:  I think cold weather has been giving it more of an ache, I've been trying to stay active so it doesn't get tight. We did the hurdles the other day, it helped balance wise.    Eval: Pt is s/p Lt TKA on 12/11/24.  He is walking with [facility] today.  He had HHPT x 5 visits.  PERTINENT HISTORY:  Morbid obesity, OA, HTN, OSA   PAIN:  Are you having pain? Yes: NPRS scale: 0/10 this morning  Pain location: Lt knee Pain  description: very minor toothache, barely registering Aggravating factors: stairs, walking longer distances, trying to increase flexion Relieving factors: repositioning, rest, uses floor bike 3-4 times a day, ice  PRECAUTIONS:  None  RED FLAGS: None   WEIGHT BEARING RESTRICTIONS:  No  FALLS:  Has patient fallen in last 6 months? 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, play with dogs, golf, no regular exercise  PATIENT GOALS:  Regain function of Lt knee, improve pain   OBJECTIVE:  Note: Objective measures were completed at Evaluation unless otherwise noted.  PATIENT SURVEYS:  Patient-Specific Activity Scoring Scheme  0 represents unable to perform. 10 represents able to perform at prior level. 0 1 2 3 4 5 6 7 8 9  10 (Date and Score)   Activity Eval   01/19/25  1. Stairs 5  9  2. Walking long distances 7 9   Score 6 9   Total score = sum of the activity scores/number of activities Minimum detectable change (90%CI) for average score = 2 points Minimum detectable change (90%CI) for single activity score = 3 points    COGNITIVE STATUS: Within functional limits for tasks assessed   SENSATION: WFL  GAIT: 01/04/2025 Distance walked: 200' within clinic Assistive device utilized: Quad cane small base Level of assistance: Modified independence Comments: recommend SPC, decreased stance on Lt; decreased Lt hip/knee flexion   EDEMA: 01/04/2025  Not formally measured due to pants; visible swelling in Lt knee and thigh noted   LOWER EXTREMITY ROM:     ROM Right eval Left eval Left 01/12/25 Left 01/14/25 Left 01/19/25 AROM Left 01/26/25 AROM   Knee flexion  A: 96 P: 102 AROM seated 98*, AAROM 104* Supine  A 101 96* seated  106* AAROM supine 104* seated, 111* supine   Knee extension A: 3 hyper (seated LAQ) A: -5 (seated LAQ) P: -2 11* AROM  Supine  +4>0 A + 2>0 P 12* seated LAQ  5* supine quad set with heel prop   8* LAQ seated, 4* supine quad set     (Blank rows = not tested)   LOWER EXTREMITY MMT:   01/04/25: Not formally tested at eval; Lt knee grossly 3-/5  MMT Right eval Left eval R 01/19/25 L 01/19/25  Hip flexion      Hip extension      Hip abduction      Hip adduction      Hip internal rotation      Hip external rotation      Knee flexion   4+ 4+  Knee extension   4+ 4+  Ankle dorsiflexion      Ankle plantarflexion      Ankle inversion      Ankle  eversion       (Blank rows = not tested)      TREATMENT:  L TKA                                                                                                                            DATE:   01/26/25   Nustep L5x8 minutes BLEs only for w/u (both bikes not available) Shuttle BLE press 112# x15  Shuttle single leg press 56# x15 B Tandem stance 4x30 seconds by TM rail ROM update  LAQs 4# 2x10 L with 3 second holds STS 15# KB 2x10 with L LE staggered back  Forward step ups 4 inch box x15 B Lateral step ups 4 inch box x15 B  Percussion gun distal hams and proximal calf prone    01/21/25 Scifit seat 9-->7 x8 min total, full rotations for ROM and w/u  Leg Press 100# 3x15 bilateral  L #50 2x10 TKE with TB green 2x15 Sit to stand with KB 15#  2x10 Step ups onto single L LE 2x10 4 step  Hurdles forward and lateral LAQ 3# 15x2 Seated SLR 3# 2x10 Vaso 10 med comp 34 degrees  01/19/25  Scifit seat 10-->7 x8 min total, full rotations for ROM and w/u  Shuttle BLE press 75# with holds into flexion and extension x12 Shuttle L LE press 37# x12  ROM, MMT, discussed steps, PSFS, goals in prep for MD visit tomorrow  Patella mobs all directions  Knee extension OP supine HS stretches supine 2x30 seconds L  Percussion  gun distal hamstrings and proximal calf sidelying    01/14/25 Sci fit at seat 10 2 min seat 9 then seat 8  Squats 2x10 Standing heel raises 2x10 TKE with TB green 2x10 Incline board stretch 3x25 sec LAQ 4x10 3# Seated SLR 3x10 1# Quad sets with towel under ankle 20x  Manual for flexion and ext gains, hamstring stretching Vaso to LLE in elevation med compression at 34 degreesx10 min   PATIENT EDUCATION:  Education details: HEP Person educated: Patient and father-in-law Education method: Explanation, Demonstration, and Handouts Education comprehension: verbalized understanding, returned demonstration, and needs further education  HOME EXERCISE PROGRAM: Access Code: [email or web address] URL: [email or web address] Date: 01/04/2025 Prepared by: Corean Ku  Exercises - Long Sitting Quad Set with Towel Roll Under Heel  - 5-10 x daily - 7 x weekly - 1 sets - 10 reps - 5 sec hold - Supine Heel Slide with Strap  - 5-10 x daily - 7 x weekly - 1 sets - 10 reps - Seated Knee Extension AROM  - 3-5 x daily - 7 x weekly - 1 sets - 10 reps - 5 sec hold - Seated Knee Extension AROM  - 3-5 x daily - 7 x weekly - 1 sets - 10 reps - 10 sec hold - Seated Straight Leg Raise   - 3-5 x daily - 7 x weekly - 1 sets - 10 reps - 5 sec  hold   ASSESSMENT:  CLINICAL IMPRESSION:  Arrived today doing well, having a little more low grade aching due to the cold but happy with how his knee is doing overall. Updated ROM measures today, otherwise kept working on functional ROM and strength as tolerated. Continued with percussion gun to hamstrings as this has been helpful in the past.    EVAL: Patient is a 64 y.o. male who was seen today for physical therapy evaluation and treatment for Lt TKA. He demonstrates decreased ROM, strength and balance, as well as gait abnormalities and expected post op pain and swelling affecting functional mobility.  He will benefit from PT to address deficits listed.      OBJECTIVE IMPAIRMENTS: Abnormal gait, decreased activity tolerance, decreased balance, decreased mobility, difficulty walking, decreased ROM, decreased strength, increased muscle spasms, impaired flexibility, and pain.   ACTIVITY LIMITATIONS: carrying, lifting, sitting, standing, squatting, sleeping, stairs, transfers, bed mobility, hygiene/grooming, and locomotion level  PARTICIPATION LIMITATIONS: meal prep, cleaning, laundry, driving, shopping, community activity, occupation, and yard work  PERSONAL FACTORS: Age, Behavior pattern, Past/current experiences, Time since onset of injury/illness/exacerbation, and 3+ comorbidities: Morbid obesity, OA, HTN, OSA, Rt TKA are also affecting patient's functional outcome.   REHAB POTENTIAL: Good  CLINICAL DECISION MAKING: Stable/uncomplicated  EVALUATION COMPLEXITY: Low   GOALS: Goals reviewed with patient? Yes  SHORT TERM GOALS: Target date: 02/01/2025  Independent with initial HEP Goal status: MET 01/15/25  2.  Lt knee AROM improved 0-100 for improved mobility and function Goal status: PARTIALLY MET 01/26/25  LONG TERM GOALS: Ongoing 01/14/25   Independent with final HEP Goal status: ONGOING 01/19/25  2.  PSFS score improved by 2 points Goal status: MET 01/19/25  3.  Lt knee AROM improved to 0-110 for improved function and mobility Goal status: ONGOING 01/19/25  4.  Report pain < 3/10 with standing and walking activities for improved function Goal status: PARTIALLY MET 01/19/25 (pain 2/10 usually, but ache can get to 4-5/10)  5.  Amb without AD independently and without significant deviations for improved function and mobility Goal status: ONGOING 01/19/25  6.  Negotiate stairs with single handrail reciprocally with pain <2/10 Goal status: ONGOING 01/19/25      PLAN:  PT FREQUENCY: 2x/week  PT DURATION: 8 weeks  PLANNED INTERVENTIONS: 97164- PT Re-evaluation, 97750- Physical Performance Testing, 97110-Therapeutic  exercises, 97530- Therapeutic activity, 97112- Neuromuscular re-education, 97535- Self Care, 02859- Manual therapy, [phone]- Gait training, [phone]- Aquatic Therapy, [phone]- Electrical stimulation (unattended), 97016- Vasopneumatic device, [phone] (1-2 muscles), 20561 (3+ muscles)- Dry Needling, Patient/Family education, Balance training, Stair training, Taping, Joint mobilization, Joint manipulation, DME instructions, Cryotherapy, and Moist heat.  PLAN FOR NEXT SESSION: HEP updates PRN, continue extension focus/quad activation, transition to [facility] and eventually no device. Continue percussion gun if this was helpful. Manual as desired.    Josette Rough, PT, DPT 01/26/25 1:45 PM       "

## 2025-01-27 NOTE — Therapy (Signed)
 " OUTPATIENT PHYSICAL THERAPY TREATMENT    Patient Name: Charles Morales MRN: [id number] DOB:[birth year], 64 y.o., male Today's Date: 01/28/2025  END OF SESSION:  PT End of Session - 01/28/25 0951     Visit Number 8    Number of Visits 16    Date for Recertification  03/01/25    Authorization Type Workers Comp Rueben Mirza Case Manager: Cell [phone], Fax [phone]    PT Start Time [phone]    PT Stop Time 1013    PT Time Calculation (min) 48 min    Activity Tolerance Patient tolerated treatment well    Behavior During Therapy [facility] for tasks assessed/performed                 Past Medical History:  Diagnosis Date   Allergy    Arthritis    right knee   Esophageal reflux    Family history of breast cancer    Family history of genetic disease carrier    sister NBN pathogenic variant   Family history of pancreatic cancer    Fatty liver    HLD (hyperlipidemia)    HTN (hypertension)    Morbid obesity (HCC)    Pre-diabetes    Sleep apnea    USES CPAP    Tobacco use disorder    Unspecified sleep apnea    Past Surgical History:  Procedure Laterality Date   COLONOSCOPY  2013   COLONOSCOPY WITH PROPOFOL  N/A 05/20/2018   Procedure: COLONOSCOPY WITH PROPOFOL ;  Surgeon: Albertus Gordy HERO, MD;  Location: [facility];  Service: Gastroenterology;  Laterality: N/A;   KNEE ARTHROSCOPY     left   TOTAL KNEE ARTHROPLASTY Right 10/12/2022   Procedure: RIGHT TOTAL KNEE ARTHROPLASTY;  Surgeon: Vernetta Lonni GRADE, MD;  Location: [facility];  Service: Orthopedics;  Laterality: Right;   TOTAL KNEE ARTHROPLASTY Left 12/11/2024   Procedure: ARTHROPLASTY, KNEE, TOTAL;  Surgeon: Vernetta Lonni GRADE, MD;  Location: [facility];  Service: Orthopedics;  Laterality: Left;   Patient Active Problem List   Diagnosis Date Noted   Status post total left knee replacement 12/11/2024   Right kidney mass 03/02/2024   Pyelonephritis 03/02/2024   Urinary urgency 03/02/2024   Vitamin D  deficiency  12/21/2023   Stress reaction 12/20/2023   Encounter for physical examination related to employment 10/02/2023   Status post total right knee replacement 10/12/2022   Current use of proton pump inhibitor 11/20/2021   Genetic testing 09/15/2018   Family history of genetic disease carrier 09/02/2018   Family history of breast cancer    Family history of pancreatic cancer    Prostate cancer screening 08/17/2018   History of colonic polyps    Prediabetes 04/01/2016   Left ankle pain 10/14/2015   PVC's (premature ventricular contractions) 10/22/2013   Colon cancer screening 05/23/2012   Routine general medical examination at a health care facility 05/20/2012   Right knee pain 06/08/2011   G E R D 08/04/2007   Hyperlipidemia 07/22/2007   Morbid obesity (HCC) 07/22/2007   SMOKELESS TOBACCO ABUSE 07/22/2007   Hypertension, essential 07/22/2007   OSA (obstructive sleep apnea) 07/22/2007    PCP: Randeen Laine LABOR, MD  REFERRING PROVIDER: Vernetta Lonni GRADE, MD  REFERRING DIAG: Lt TKA  Rationale for Evaluation and Treatment: Rehabilitation  THERAPY DIAG:  Acute pain of left knee  Stiffness of left knee, not elsewhere classified  Localized edema  Other abnormalities of gait and mobility  Unsteadiness on feet  ONSET DATE: DOS: 12/11/24  SUBJECTIVE:                                                                                                                                                                                           SUBJECTIVE STATEMENT:  Pt reports not sleeping well. No pain but heavy stiffness.  Eval: Pt is s/p Lt TKA on 12/11/24.  He is walking with [facility] today.  He had HHPT x 5 visits.  PERTINENT HISTORY:  Morbid obesity, OA, HTN, OSA   PAIN:  Are you having pain? Yes: NPRS scale: 0/10 this morning  Pain location: Lt knee Pain description: very minor toothache, barely registering Aggravating factors: stairs, walking longer distances, trying to  increase flexion Relieving factors: repositioning, rest, uses floor bike 3-4 times a day, ice  PRECAUTIONS:  None  RED FLAGS: None   WEIGHT BEARING RESTRICTIONS:  No  FALLS:  Has patient fallen in last 6 months? 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, play with dogs, golf, no regular exercise  PATIENT GOALS:  Regain function of Lt knee, improve pain   OBJECTIVE:  Note: Objective measures were completed at Evaluation unless otherwise noted.  PATIENT SURVEYS:  Patient-Specific Activity Scoring Scheme  0 represents unable to perform. 10 represents able to perform at prior level. 0 1 2 3 4 5 6 7 8 9  10 (Date and Score)   Activity Eval   01/19/25  1. Stairs 5  9  2. Walking long distances 7 9   Score 6 9   Total score = sum of the activity scores/number of activities Minimum detectable change (90%CI) for average score = 2 points Minimum detectable change (90%CI) for single activity score = 3 points    COGNITIVE STATUS: Within functional limits for tasks assessed   SENSATION: WFL  GAIT: 01/04/2025 Distance walked: 200' within clinic Assistive device utilized: Quad cane small base Level of assistance: Modified independence Comments: recommend SPC, decreased stance on Lt; decreased Lt hip/knee flexion   EDEMA: 01/04/2025  Not formally measured due to pants; visible swelling in Lt knee and thigh noted   LOWER EXTREMITY ROM:     ROM Right eval Left eval Left 01/12/25 Left 01/14/25 Left 01/19/25 AROM Left 01/26/25 AROM   Knee flexion  A: 96 P: 102 AROM seated 98*, AAROM 104* Supine A 101 96* seated  106* AAROM supine 104* seated, 111* supine  Knee extension A: 3 hyper (seated LAQ) A: -5 (seated  LAQ) P: -2 11* AROM  Supine  +4>0 A + 2>0 P 12* seated LAQ  5* supine quad set with heel prop   8* LAQ seated, 4* supine quad set     (Blank rows = not tested)   LOWER EXTREMITY MMT:   01/04/25: Not formally tested at eval; Lt knee grossly 3-/5  MMT Right eval Left eval R 01/19/25 L 01/19/25  Hip flexion      Hip extension      Hip abduction      Hip adduction      Hip internal rotation      Hip external rotation      Knee flexion   4+ 4+  Knee extension   4+ 4+  Ankle dorsiflexion      Ankle plantarflexion      Ankle inversion      Ankle eversion       (Blank rows = not tested)      TREATMENT:  L TKA                                                                                                                            DATE:  01/28/25 Rec bike seat - blue bar at 5- level 2 Step ups on 6 step leading with L15x with UE use forward, 15x lateral Step downs on 4 step leading with R  Air Ex tandem walking 8 laps Shuttle BLE press 112# 3x10   Shuttle single leg press 56# 2x15 LAQs 4# 2x10 L with 3 second holds STS 15# KB 2x10 with L LE staggered back    01/26/25  Nustep L5x8 minutes BLEs only for w/u (both bikes not available) Shuttle BLE press 112# x15  Shuttle single leg press 56# x15 B Tandem stance 4x30 seconds by TM rail ROM update  LAQs 4# 2x10 L with 3 second holds STS 15# KB 2x10 with L LE staggered back  Forward step ups 4 inch box x15 B Lateral step ups 4 inch box x15 B  Percussion gun distal hams and proximal calf prone    01/21/25 Scifit seat 9-->7 x8 min total, full rotations for ROM and w/u  Leg Press 100# 3x15 bilateral  L #50 2x10 TKE with TB green 2x15 Sit to stand with KB 15#  2x10 Step ups onto single L LE 2x10 4 step  Hurdles forward and lateral LAQ 3# 15x2 Seated SLR 3# 2x10 Vaso 10 med comp 34 degrees  PATIENT EDUCATION:  Education details: HEP Person educated: Patient and father-in-law Education method: Explanation,  Facilities Manager, and Handouts Education comprehension: verbalized understanding, returned demonstration, and needs further education  HOME EXERCISE PROGRAM: Access Code: [email or web address] URL: [email or web address] Date: 01/04/2025 Prepared by: Corean Ku  Exercises - Long Sitting Quad Set with Towel Roll Under Heel  - 5-10 x daily - 7 x weekly - 1 sets - 10 reps - 5 sec hold -  Supine Heel Slide with Strap  - 5-10 x daily - 7 x weekly - 1 sets - 10 reps - Seated Knee Extension AROM  - 3-5 x daily - 7 x weekly - 1 sets - 10 reps - 5 sec hold - Seated Knee Extension AROM  - 3-5 x daily - 7 x weekly - 1 sets - 10 reps - 10 sec hold - Seated Straight Leg Raise   - 3-5 x daily - 7 x weekly - 1 sets - 10 reps - 5 sec hold   ASSESSMENT:  CLINICAL IMPRESSION: Pt needed VC and bar assistance for Air ex balance work.    OBJECTIVE IMPAIRMENTS: Abnormal gait, decreased activity tolerance, decreased balance, decreased mobility, difficulty walking, decreased ROM, decreased strength, increased muscle spasms, impaired flexibility, and pain.   ACTIVITY LIMITATIONS: carrying, lifting, sitting, standing, squatting, sleeping, stairs, transfers, bed mobility, hygiene/grooming, and locomotion level  PARTICIPATION LIMITATIONS: meal prep, cleaning, laundry, driving, shopping, community activity, occupation, and yard work  PERSONAL FACTORS: Age, Behavior pattern, Past/current experiences, Time since onset of injury/illness/exacerbation, and 3+ comorbidities: Morbid obesity, OA, HTN, OSA, Rt TKA are also affecting patient's functional outcome.   REHAB POTENTIAL: Good  CLINICAL DECISION MAKING: Stable/uncomplicated  EVALUATION COMPLEXITY: Low   GOALS: Goals reviewed with patient? Yes  SHORT TERM GOALS: Target date: 02/01/2025  Independent with initial HEP Goal status: MET 01/15/25  2.  Lt knee AROM improved 0-100 for improved mobility and function Goal status: PARTIALLY MET 01/26/25  LONG  TERM GOALS: Ongoing 01/14/25   Independent with final HEP Goal status: ONGOING 01/19/25  2.  PSFS score improved by 2 points Goal status: MET 01/19/25  3.  Lt knee AROM improved to 0-110 for improved function and mobility Goal status: ONGOING 01/19/25  4.  Report pain < 3/10 with standing and walking activities for improved function Goal status: PARTIALLY MET 01/19/25 (pain 2/10 usually, but ache can get to 4-5/10)  5.  Amb without AD independently and without significant deviations for improved function and mobility Goal status: ONGOING 01/19/25  6.  Negotiate stairs with single handrail reciprocally with pain <2/10 Goal status: ONGOING 01/19/25      PLAN:  PT FREQUENCY: 2x/week  PT DURATION: 8 weeks  PLANNED INTERVENTIONS: 97164- PT Re-evaluation, 97750- Physical Performance Testing, 97110-Therapeutic exercises, 97530- Therapeutic activity, 97112- Neuromuscular re-education, 97535- Self Care, 02859- Manual therapy, [phone]- Gait training, [phone]- Aquatic Therapy, [phone]- Electrical stimulation (unattended), 97016- Vasopneumatic device, [phone] (1-2 muscles), 20561 (3+ muscles)- Dry Needling, Patient/Family education, Balance training, Stair training, Taping, Joint mobilization, Joint manipulation, DME instructions, Cryotherapy, and Moist heat.  PLAN FOR NEXT SESSION: HEP updates PRN, continue extension focus/quad activation. Continue percussion gun if this was helpful. Manual as desired.   Burnard Meth, PT 01/28/25  10:08 AM       "

## 2025-01-28 ENCOUNTER — Ambulatory Visit

## 2025-01-28 DIAGNOSIS — R2689 Other abnormalities of gait and mobility: Secondary | ICD-10-CM | POA: Diagnosis not present

## 2025-01-28 DIAGNOSIS — M25562 Pain in left knee: Secondary | ICD-10-CM | POA: Diagnosis not present

## 2025-01-28 DIAGNOSIS — M25662 Stiffness of left knee, not elsewhere classified: Secondary | ICD-10-CM

## 2025-01-28 DIAGNOSIS — R600 Localized edema: Secondary | ICD-10-CM

## 2025-01-28 DIAGNOSIS — R2681 Unsteadiness on feet: Secondary | ICD-10-CM

## 2025-02-01 ENCOUNTER — Other Ambulatory Visit: Payer: Self-pay | Admitting: Orthopaedic Surgery

## 2025-02-02 ENCOUNTER — Encounter

## 2025-02-03 ENCOUNTER — Encounter: Payer: Self-pay | Admitting: Physical Therapy

## 2025-02-03 ENCOUNTER — Ambulatory Visit: Admitting: Physical Therapy

## 2025-02-03 DIAGNOSIS — R600 Localized edema: Secondary | ICD-10-CM

## 2025-02-03 DIAGNOSIS — M25562 Pain in left knee: Secondary | ICD-10-CM

## 2025-02-03 DIAGNOSIS — R2681 Unsteadiness on feet: Secondary | ICD-10-CM

## 2025-02-03 DIAGNOSIS — R2689 Other abnormalities of gait and mobility: Secondary | ICD-10-CM

## 2025-02-03 DIAGNOSIS — M25662 Stiffness of left knee, not elsewhere classified: Secondary | ICD-10-CM

## 2025-02-03 NOTE — Therapy (Signed)
 " OUTPATIENT PHYSICAL THERAPY TREATMENT    Patient Name: Charles Morales MRN: [id number] DOB:06-30-1961, 64 y.o., male Today's Date: 02/03/2025  END OF SESSION:  PT End of Session - 02/03/25 0755     Visit Number 9    Number of Visits 16    Date for Recertification  03/01/25    Authorization Type Workers Comp Charles Morales Case Manager: Cell [phone], Fax [phone]    Authorization Time Period 15 total visits approved    PT Start Time 0800    PT Stop Time 0840    PT Time Calculation (min) 40 min    Activity Tolerance Patient tolerated treatment well    Behavior During Therapy WFL for tasks assessed/performed                  Past Medical History:  Diagnosis Date   Allergy    Arthritis    right knee   Esophageal reflux    Family history of breast cancer    Family history of genetic disease carrier    sister NBN pathogenic variant   Family history of pancreatic cancer    Fatty liver    HLD (hyperlipidemia)    HTN (hypertension)    Morbid obesity (HCC)    Pre-diabetes    Sleep apnea    USES CPAP    Tobacco use disorder    Unspecified sleep apnea    Past Surgical History:  Procedure Laterality Date   COLONOSCOPY  2013   COLONOSCOPY WITH PROPOFOL  N/A 05/20/2018   Procedure: COLONOSCOPY WITH PROPOFOL ;  Surgeon: Charles Gordy HERO, MD;  Location: [facility];  Service: Gastroenterology;  Laterality: N/A;   KNEE ARTHROSCOPY     left   TOTAL KNEE ARTHROPLASTY Right 10/12/2022   Procedure: RIGHT TOTAL KNEE ARTHROPLASTY;  Surgeon: Charles Lonni GRADE, MD;  Location: [facility];  Service: Orthopedics;  Laterality: Right;   TOTAL KNEE ARTHROPLASTY Left 12/11/2024   Procedure: ARTHROPLASTY, KNEE, TOTAL;  Surgeon: Charles Lonni GRADE, MD;  Location: [facility];  Service: Orthopedics;  Laterality: Left;   Patient Active Problem List   Diagnosis Date Noted   Status post total left knee replacement 12/11/2024   Right kidney mass 03/02/2024   Pyelonephritis  03/02/2024   Urinary urgency 03/02/2024   Vitamin D  deficiency 12/21/2023   Stress reaction 12/20/2023   Encounter for physical examination related to employment 10/02/2023   Status post total right knee replacement 10/12/2022   Current use of proton pump inhibitor 11/20/2021   Genetic testing 09/15/2018   Family history of genetic disease carrier 09/02/2018   Family history of breast cancer    Family history of pancreatic cancer    Prostate cancer screening 08/17/2018   History of colonic polyps    Prediabetes 04/01/2016   Left ankle pain 10/14/2015   PVC's (premature ventricular contractions) 10/22/2013   Colon cancer screening 05/23/2012   Routine general medical examination at a health care facility 05/20/2012   Right knee pain 06/08/2011   G E R D 08/04/2007   Hyperlipidemia 07/22/2007   Morbid obesity (HCC) 07/22/2007   SMOKELESS TOBACCO ABUSE 07/22/2007   Hypertension, essential 07/22/2007   OSA (obstructive sleep apnea) 07/22/2007    PCP: Charles Laine LABOR, MD  REFERRING PROVIDER: Vernetta Lonni GRADE, MD  REFERRING DIAG: Lt TKA  Rationale for Evaluation and Treatment: Rehabilitation  THERAPY DIAG:  Acute pain of left knee  Stiffness of left knee, not elsewhere classified  Localized edema  Other abnormalities of gait and mobility  Unsteadiness on feet  ONSET DATE: DOS: 12/11/24   SUBJECTIVE:                                                                                                                                                                                           SUBJECTIVE STATEMENT: Doing pretty well; no pain just soreness  Eval: Pt is s/p Lt TKA on 12/11/24.  He is walking with [name] today.  He had HHPT x 5 visits.  PERTINENT HISTORY:  Morbid obesity, OA, HTN, OSA   PAIN:  Are you having pain? Yes: NPRS scale: 0/10 this morning, 2/10 soreness  Pain location: Lt knee Pain description: very minor toothache, barely  registering Aggravating factors: stairs, walking longer distances, trying to increase flexion Relieving factors: repositioning, rest, uses floor bike 3-4 times a day, ice  PRECAUTIONS:  None  RED FLAGS: None   WEIGHT BEARING RESTRICTIONS:  No  FALLS:  Has patient fallen in last 6 months? 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, play with dogs, golf, no regular exercise  PATIENT GOALS:  Regain function of Lt knee, improve pain   OBJECTIVE:  Note: Objective measures were completed at Evaluation unless otherwise noted.  PATIENT SURVEYS:  Patient-Specific Activity Scoring Scheme  0 represents unable to perform. 10 represents able to perform at prior level. 0 1 2 3 4 5 6 7 8 9  10 (Date and Score)   Activity Eval   01/19/25  1. Stairs 5  9  2. Walking long distances 7 9   Score 6 9   Total score = sum of the activity scores/number of activities Minimum detectable change (90%CI) for average score = 2 points Minimum detectable change (90%CI) for single activity score = 3 points    COGNITIVE STATUS: Within functional limits for tasks assessed   SENSATION: WFL  GAIT: 01/04/2025 Distance walked: 200' within clinic Assistive device utilized: Quad cane small base Level of assistance: Modified independence Comments: recommend SPC, decreased stance on Lt; decreased Lt hip/knee flexion   EDEMA: 01/04/2025  Not formally measured due to pants; visible swelling in Lt knee and thigh noted   LOWER EXTREMITY ROM:     ROM Right eval Left eval Left 01/12/25 Left 01/14/25 Left 01/19/25 AROM Left 01/26/25 AROM   Knee flexion  A: 96 P: 102 AROM seated 98*, AAROM 104* Supine A 101 96* seated  106*  AAROM supine 104*  seated, 111* supine   Knee extension A: 3 hyper (seated LAQ) A: -5 (seated LAQ) P: -2 11* AROM  Supine  +4>0 A + 2>0 P 12* seated LAQ  5* supine quad set with heel prop   8* LAQ seated, 4* supine quad set     (Blank rows = not tested)   LOWER EXTREMITY MMT:   01/04/25: Not formally tested at eval; Lt knee grossly 3-/5  MMT Right eval Left eval R 01/19/25 L 01/19/25  Hip flexion      Hip extension      Hip abduction      Hip adduction      Hip internal rotation      Hip external rotation      Knee flexion   4+ 4+  Knee extension   4+ 4+  Ankle dorsiflexion      Ankle plantarflexion      Ankle inversion      Ankle eversion       (Blank rows = not tested)      TREATMENT:  L TKA DATE:  02/03/25 TherAct SciFit seat 9 x 8 min Leg press 112# 3x10; then LLE only 62# 3x10; 60 sec extension holds (with overpressure at distal thigh) between sets (x 5 reps) Kallie with RLE march 2x10; 5 sec hold Sit to/from stand with 20# KB 2x10 Knee ext machine 3x10; 10# LLE only  TherEx Seated hamstring stretch 3x30 sec on Lt Discussed heel prop with weight and ice to help with passive extension supine Supine hamstring stretch with strap and overpressure 3x30 sec on Lt    01/28/25 Rec bike seat - blue bar at 5- level 2 Step ups on 6 step leading with L15x with UE use forward, 15x lateral Step downs on 4 step leading with R  Air Ex tandem walking 8 laps Shuttle BLE press 112# 3x10   Shuttle single leg press 56# 2x15 LAQs 4# 2x10 L with 3 second holds STS 15# KB 2x10 with L LE staggered back    01/26/25  Nustep L5x8 minutes BLEs only for w/u (both bikes not available) Shuttle BLE press 112# x15  Shuttle single leg press 56# x15 B Tandem stance 4x30 seconds by TM rail ROM update  LAQs 4# 2x10 L with 3 second holds STS 15# KB 2x10 with L LE staggered back  Forward step ups 4 inch box x15 B Lateral step ups 4 inch box x15 B  Percussion gun distal hams and proximal calf prone     01/21/25 Scifit seat 9-->7 x8 min total, full rotations for ROM and w/u  Leg Press 100# 3x15 bilateral  L #50 2x10 TKE with TB green 2x15 Sit to stand with KB 15#  2x10 Step ups onto single L LE 2x10 4 step  Hurdles forward and lateral LAQ 3# 15x2 Seated SLR 3# 2x10 Vaso 10 med comp 34 degrees  PATIENT EDUCATION:  Education details: HEP Person educated: Patient and father-in-law Education method: Explanation, Facilities Manager, and Handouts Education comprehension: verbalized understanding, returned demonstration, and needs further education  HOME EXERCISE PROGRAM: Access Code: [email or web address] URL: [email or web address] Date: 01/04/2025 Prepared by: Corean Ku  Exercises - Long Sitting Quad Set with Towel Roll Under Heel  - 5-10 x daily - 7 x weekly - 1 sets - 10 reps - 5 sec hold - Supine Heel Slide with Strap  - 5-10 x daily - 7 x weekly - 1 sets - 10 reps - Seated Knee Extension AROM  -  3-5 x daily - 7 x weekly - 1 sets - 10 reps - 5 sec hold - Seated Knee Extension AROM  - 3-5 x daily - 7 x weekly - 1 sets - 10 reps - 10 sec hold - Seated Straight Leg Raise   - 3-5 x daily - 7 x weekly - 1 sets - 10 reps - 5 sec hold   ASSESSMENT:  CLINICAL IMPRESSION: Pt tolerated session well and worked on [employer] today as pt is still lacking a few degrees of extension at this time.  Discussed ways to work on this at home.  Will continue to benefit from PT to maximize function.  OBJECTIVE IMPAIRMENTS: Abnormal gait, decreased activity tolerance, decreased balance, decreased mobility, difficulty walking, decreased ROM, decreased strength, increased muscle spasms, impaired flexibility, and pain.   ACTIVITY LIMITATIONS: carrying, lifting, sitting, standing, squatting, sleeping, stairs, transfers, bed mobility, hygiene/grooming, and locomotion level  PARTICIPATION LIMITATIONS: meal prep, cleaning, laundry, driving, shopping, community activity, occupation, and yard  work  PERSONAL FACTORS: Age, Behavior pattern, Past/current experiences, Time since onset of injury/illness/exacerbation, and 3+ comorbidities: Morbid obesity, OA, HTN, OSA, Rt TKA are also affecting patient's functional outcome.   REHAB POTENTIAL: Good  CLINICAL DECISION MAKING: Stable/uncomplicated  EVALUATION COMPLEXITY: Low   GOALS: Goals reviewed with patient? Yes  SHORT TERM GOALS: Target date: 02/01/2025  Independent with initial HEP Goal status: MET 01/15/25  2.  Lt knee AROM improved 0-100 for improved mobility and function Goal status: PARTIALLY MET 01/26/25  LONG TERM GOALS: Ongoing 01/14/25   Independent with final HEP Goal status: ONGOING 01/19/25  2.  PSFS score improved by 2 points Goal status: MET 01/19/25  3.  Lt knee AROM improved to 0-110 for improved function and mobility Goal status: ONGOING 01/19/25  4.  Report pain < 3/10 with standing and walking activities for improved function Goal status: PARTIALLY MET 01/19/25 (pain 2/10 usually, but ache can get to 4-5/10)  5.  Amb without AD independently and without significant deviations for improved function and mobility Goal status: ONGOING 01/19/25  6.  Negotiate stairs with single handrail reciprocally with pain <2/10 Goal status: ONGOING 01/19/25      PLAN:  PT FREQUENCY: 2x/week  PT DURATION: 8 weeks  PLANNED INTERVENTIONS: 97164- PT Re-evaluation, 97750- Physical Performance Testing, 97110-Therapeutic exercises, 97530- Therapeutic activity, 97112- Neuromuscular re-education, 97535- Self Care, 02859- Manual therapy, [phone]- Gait training, [phone]- Aquatic Therapy, [phone]- Electrical stimulation (unattended), 97016- Vasopneumatic device, [phone] (1-2 muscles), 20561 (3+ muscles)- Dry Needling, Patient/Family education, Balance training, Stair training, Taping, Joint mobilization, Joint manipulation, DME instructions, Cryotherapy, and Moist heat.  PLAN FOR NEXT SESSION: update measurements, HEP updates PRN,  continue extension focus/quad activation. Continue percussion gun if this was helpful. Manual as desired.     Corean JULIANNA Ku, PT, DPT 02/03/25 8:41 AM        "

## 2025-02-03 NOTE — Therapy (Incomplete)
 " OUTPATIENT PHYSICAL THERAPY TREATMENT    Patient Name: Charles Morales MRN: [id number] DOB:1961/10/08, 64 y.o., male Today's Date: 02/03/2025  END OF SESSION:           Past Medical History:  Diagnosis Date   Allergy    Arthritis    right knee   Esophageal reflux    Family history of breast cancer    Family history of genetic disease carrier    sister NBN pathogenic variant   Family history of pancreatic cancer    Fatty liver    HLD (hyperlipidemia)    HTN (hypertension)    Morbid obesity (HCC)    Pre-diabetes    Sleep apnea    USES CPAP    Tobacco use disorder    Unspecified sleep apnea    Past Surgical History:  Procedure Laterality Date   COLONOSCOPY  2013   COLONOSCOPY WITH PROPOFOL  N/A 05/20/2018   Procedure: COLONOSCOPY WITH PROPOFOL ;  Surgeon: Albertus Gordy HERO, MD;  Location: [facility];  Service: Gastroenterology;  Laterality: N/A;   KNEE ARTHROSCOPY     left   TOTAL KNEE ARTHROPLASTY Right 10/12/2022   Procedure: RIGHT TOTAL KNEE ARTHROPLASTY;  Surgeon: Vernetta Lonni GRADE, MD;  Location: [facility];  Service: Orthopedics;  Laterality: Right;   TOTAL KNEE ARTHROPLASTY Left 12/11/2024   Procedure: ARTHROPLASTY, KNEE, TOTAL;  Surgeon: Vernetta Lonni GRADE, MD;  Location: [facility];  Service: Orthopedics;  Laterality: Left;   Patient Active Problem List   Diagnosis Date Noted   Status post total left knee replacement 12/11/2024   Right kidney mass 03/02/2024   Pyelonephritis 03/02/2024   Urinary urgency 03/02/2024   Vitamin D  deficiency 12/21/2023   Stress reaction 12/20/2023   Encounter for physical examination related to employment 10/02/2023   Status post total right knee replacement 10/12/2022   Current use of proton pump inhibitor 11/20/2021   Genetic testing 09/15/2018   Family history of genetic disease carrier 09/02/2018   Family history of breast cancer    Family history of pancreatic cancer    Prostate cancer screening 08/17/2018    History of colonic polyps    Prediabetes 04/01/2016   Left ankle pain 10/14/2015   PVC's (premature ventricular contractions) 10/22/2013   Colon cancer screening 05/23/2012   Routine general medical examination at a health care facility 05/20/2012   Right knee pain 06/08/2011   G E R D 08/04/2007   Hyperlipidemia 07/22/2007   Morbid obesity (HCC) 07/22/2007   SMOKELESS TOBACCO ABUSE 07/22/2007   Hypertension, essential 07/22/2007   OSA (obstructive sleep apnea) 07/22/2007    PCP: Randeen Laine LABOR, MD  REFERRING PROVIDER: Randeen Laine LABOR, MD  REFERRING DIAG: Lt TKA  Rationale for Evaluation and Treatment: Rehabilitation  THERAPY DIAG:  No diagnosis found.  ONSET DATE: DOS: 12/11/24   SUBJECTIVE:  SUBJECTIVE STATEMENT: *** Pt reports not sleeping well. No pain but heavy stiffness.  Eval: Pt is s/p Lt TKA on 12/11/24.  He is walking with [facility] today.  He had HHPT x 5 visits.  PERTINENT HISTORY:  Morbid obesity, OA, HTN, OSA   PAIN:  ***Are you having pain? Yes: NPRS scale: 0/10 this morning  Pain location: Lt knee Pain description: very minor toothache, barely registering Aggravating factors: stairs, walking longer distances, trying to increase flexion Relieving factors: repositioning, rest, uses floor bike 3-4 times a day, ice  PRECAUTIONS:  None  RED FLAGS: None   WEIGHT BEARING RESTRICTIONS:  No  FALLS:  Has patient fallen in last 6 months? 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, play with dogs, golf, no regular  exercise  PATIENT GOALS:  Regain function of Lt knee, improve pain   OBJECTIVE:  Note: Objective measures were completed at Evaluation unless otherwise noted.  PATIENT SURVEYS:  Patient-Specific Activity Scoring Scheme  0 represents unable to perform. 10 represents able to perform at prior level. 0 1 2 3 4 5 6 7 8 9  10 (Date and Score)   Activity Eval   01/19/25  1. Stairs 5  9  2. Walking long distances 7 9   Score 6 9   Total score = sum of the activity scores/number of activities Minimum detectable change (90%CI) for average score = 2 points Minimum detectable change (90%CI) for single activity score = 3 points    COGNITIVE STATUS: Within functional limits for tasks assessed   SENSATION: WFL  GAIT: 01/04/2025 Distance walked: 200' within clinic Assistive device utilized: Quad cane small base Level of assistance: Modified independence Comments: recommend SPC, decreased stance on Lt; decreased Lt hip/knee flexion   EDEMA: 01/04/2025  Not formally measured due to pants; visible swelling in Lt knee and thigh noted   LOWER EXTREMITY ROM:     ROM Right eval Left eval Left 01/12/25 Left 01/14/25 Left 01/19/25 AROM Left 01/26/25 AROM   Knee flexion  A: 96 P: 102 AROM seated 98*, AAROM 104* Supine A 101 96* seated  106* AAROM supine 104* seated, 111* supine   Knee extension A: 3 hyper (seated LAQ) A: -5 (seated LAQ) P: -2 11* AROM  Supine  +4>0 A + 2>0 P 12* seated LAQ  5* supine quad set with heel prop   8* LAQ seated, 4* supine quad set     (Blank rows = not tested)   LOWER EXTREMITY MMT:   01/04/25: Not formally tested at eval; Lt knee grossly 3-/5  MMT Right eval Left eval R 01/19/25 L 01/19/25  Hip flexion      Hip extension      Hip abduction      Hip adduction      Hip internal rotation      Hip external rotation      Knee flexion   4+ 4+  Knee extension   4+ 4+  Ankle dorsiflexion      Ankle plantarflexion      Ankle inversion      Ankle  eversion       (Blank rows = not tested)      TREATMENT:  L TKA DATE:  02/03/25 ***  01/28/25 Rec bike seat - blue bar at 5- level 2 Step ups on 6 step leading with L15x with UE use forward, 15x lateral Step downs on 4 step leading with R  Air Ex tandem walking 8 laps Shuttle BLE press 112# 3x10   Shuttle single leg press 56# 2x15 LAQs 4# 2x10 L with 3 second holds STS 15# KB 2x10 with L LE staggered back    01/26/25  Nustep L5x8 minutes BLEs only for w/u (both bikes not available) Shuttle BLE press 112# x15  Shuttle single leg press 56# x15 B Tandem stance 4x30 seconds by TM rail ROM update  LAQs 4# 2x10 L with 3 second holds STS 15# KB 2x10 with L LE staggered back  Forward step ups 4 inch box x15 B Lateral step ups 4 inch box x15 B  Percussion gun distal hams and proximal calf prone    01/21/25 Scifit seat 9-->7 x8 min total, full rotations for ROM and w/u  Leg Press 100# 3x15 bilateral  L #50 2x10 TKE with TB green 2x15 Sit to stand with KB 15#  2x10 Step ups onto single L LE 2x10 4 step  Hurdles forward and lateral LAQ 3# 15x2 Seated SLR 3# 2x10 Vaso 10 med comp 34 degrees  PATIENT EDUCATION:  Education details: HEP Person educated: Patient and father-in-law Education method: Explanation, Facilities Manager, and Handouts Education comprehension: verbalized understanding, returned demonstration, and needs further education  HOME EXERCISE PROGRAM: Access Code: [email or web address] URL: [email or web address] Date: 01/04/2025 Prepared by: Corean Ku  Exercises - Long Sitting Quad Set with Towel Roll Under Heel  - 5-10 x daily - 7 x weekly - 1 sets - 10 reps - 5 sec hold - Supine Heel Slide with Strap  - 5-10 x daily - 7 x weekly - 1 sets - 10 reps - Seated Knee Extension AROM  - 3-5 x daily - 7 x weekly - 1 sets - 10 reps - 5 sec hold - Seated Knee Extension AROM  - 3-5 x daily - 7 x weekly - 1 sets - 10 reps - 10 sec hold - Seated Straight Leg  Raise   - 3-5 x daily - 7 x weekly - 1 sets - 10 reps - 5 sec hold   ASSESSMENT:  CLINICAL IMPRESSION: *** Pt needed VC and bar assistance for Air ex balance work.    OBJECTIVE IMPAIRMENTS: Abnormal gait, decreased activity tolerance, decreased balance, decreased mobility, difficulty walking, decreased ROM, decreased strength, increased muscle spasms, impaired flexibility, and pain.   ACTIVITY LIMITATIONS: carrying, lifting, sitting, standing, squatting, sleeping, stairs, transfers, bed mobility, hygiene/grooming, and locomotion level  PARTICIPATION LIMITATIONS: meal prep, cleaning, laundry, driving, shopping, community activity, occupation, and yard work  PERSONAL FACTORS: Age, Behavior pattern, Past/current experiences, Time since onset of injury/illness/exacerbation, and 3+ comorbidities: Morbid obesity, OA, HTN, OSA, Rt TKA are also affecting patient's functional outcome.   REHAB POTENTIAL: Good  CLINICAL DECISION MAKING: Stable/uncomplicated  EVALUATION COMPLEXITY: Low   GOALS: Goals reviewed with patient? Yes  SHORT TERM GOALS: Target date: 02/01/2025  Independent with initial HEP Goal status: MET 01/15/25  2.  Lt knee AROM improved 0-100 for improved mobility and function Goal status: PARTIALLY MET 01/26/25  LONG TERM GOALS: Ongoing 01/14/25   Independent with final HEP Goal status: ONGOING 01/19/25  2.  PSFS score improved by 2 points Goal status: MET 01/19/25  3.  Lt knee AROM improved to 0-110 for improved function and mobility Goal status: ONGOING 01/19/25  4.  Report pain < 3/10 with standing and walking activities for improved function Goal status: PARTIALLY MET 01/19/25 (pain 2/10 usually, but ache can get to 4-5/10)  5.  Amb without AD independently and without significant deviations for improved function and mobility Goal status: ONGOING 01/19/25  6.  Negotiate stairs with single handrail reciprocally with pain <2/10 Goal status: ONGOING 01/19/25       PLAN:  PT FREQUENCY: 2x/week  PT DURATION: 8 weeks  PLANNED INTERVENTIONS: 97164- PT Re-evaluation, 97750- Physical Performance Testing, 97110-Therapeutic exercises, 97530- Therapeutic activity, 97112- Neuromuscular re-education, 97535- Self Care, 02859- Manual therapy, [phone]- Gait training, [phone]- Aquatic Therapy, [phone]- Electrical stimulation (unattended), 97016- Vasopneumatic device, [phone] (1-2 muscles), [phone] (3+ muscles)- Dry Needling, Patient/Family education, Balance training, Stair training, Taping, Joint mobilization, Joint manipulation, DME instructions, Cryotherapy, and Moist heat.  PLAN FOR NEXT SESSION: *** HEP updates PRN, continue extension focus/quad activation. Continue percussion gun if this was helpful. Manual as desired.    Burnard Meth, PT 02/03/25  7:43 AM     "

## 2025-02-04 ENCOUNTER — Encounter: Payer: Self-pay | Admitting: Physical Therapy

## 2025-02-04 ENCOUNTER — Ambulatory Visit: Admitting: Physical Therapy

## 2025-02-04 DIAGNOSIS — M25662 Stiffness of left knee, not elsewhere classified: Secondary | ICD-10-CM

## 2025-02-04 DIAGNOSIS — R2689 Other abnormalities of gait and mobility: Secondary | ICD-10-CM

## 2025-02-04 DIAGNOSIS — R600 Localized edema: Secondary | ICD-10-CM

## 2025-02-04 DIAGNOSIS — R2681 Unsteadiness on feet: Secondary | ICD-10-CM

## 2025-02-04 DIAGNOSIS — M25562 Pain in left knee: Secondary | ICD-10-CM

## 2025-02-04 NOTE — Therapy (Signed)
 " OUTPATIENT PHYSICAL THERAPY TREATMENT    Patient Name: Charles Morales MRN: [id number] DOB:1961/02/04, 64 y.o., male Today's Date: 02/04/2025  END OF SESSION:  PT End of Session - 02/04/25 0926     Visit Number 10    Number of Visits 16    Date for Recertification  03/01/25    Authorization Type [employer] Rueben Mirza Case Manager: Cell [phone], Fax [phone]    Authorization Time Period 15 total visits approved    PT Start Time 0920    PT Stop Time 1000    PT Time Calculation (min) 40 min    Activity Tolerance Patient tolerated treatment well    Behavior During Therapy WFL for tasks assessed/performed                   Past Medical History:  Diagnosis Date   Allergy    Arthritis    right knee   Esophageal reflux    Family history of breast cancer    Family history of genetic disease carrier    sister NBN pathogenic variant   Family history of pancreatic cancer    Fatty liver    HLD (hyperlipidemia)    HTN (hypertension)    Morbid obesity (HCC)    Pre-diabetes    Sleep apnea    USES CPAP    Tobacco use disorder    Unspecified sleep apnea    Past Surgical History:  Procedure Laterality Date   COLONOSCOPY  2013   COLONOSCOPY WITH PROPOFOL  N/A 05/20/2018   Procedure: COLONOSCOPY WITH PROPOFOL ;  Surgeon: Albertus Gordy HERO, MD;  Location: [facility];  Service: Gastroenterology;  Laterality: N/A;   KNEE ARTHROSCOPY     left   TOTAL KNEE ARTHROPLASTY Right 10/12/2022   Procedure: RIGHT TOTAL KNEE ARTHROPLASTY;  Surgeon: Vernetta Lonni GRADE, MD;  Location: [facility];  Service: Orthopedics;  Laterality: Right;   TOTAL KNEE ARTHROPLASTY Left 12/11/2024   Procedure: ARTHROPLASTY, KNEE, TOTAL;  Surgeon: Vernetta Lonni GRADE, MD;  Location: [facility];  Service: Orthopedics;  Laterality: Left;   Patient Active Problem List   Diagnosis Date Noted   Status post total left knee replacement 12/11/2024   Right kidney mass 03/02/2024   Pyelonephritis  03/02/2024   Urinary urgency 03/02/2024   Vitamin D  deficiency 12/21/2023   Stress reaction 12/20/2023   Encounter for physical examination related to employment 10/02/2023   Status post total right knee replacement 10/12/2022   Current use of proton pump inhibitor 11/20/2021   Genetic testing 09/15/2018   Family history of genetic disease carrier 09/02/2018   Family history of breast cancer    Family history of pancreatic cancer    Prostate cancer screening 08/17/2018   History of colonic polyps    Prediabetes 04/01/2016   Left ankle pain 10/14/2015   PVC's (premature ventricular contractions) 10/22/2013   Colon cancer screening 05/23/2012   Routine general medical examination at a health care facility 05/20/2012   Right knee pain 06/08/2011   G E R D 08/04/2007   Hyperlipidemia 07/22/2007   Morbid obesity (HCC) 07/22/2007   SMOKELESS TOBACCO ABUSE 07/22/2007   Hypertension, essential 07/22/2007   OSA (obstructive sleep apnea) 07/22/2007    PCP: Randeen Laine LABOR, MD  REFERRING PROVIDER: Vernetta Lonni GRADE, MD  REFERRING DIAG: Lt TKA  Rationale for Evaluation and Treatment: Rehabilitation  THERAPY DIAG:  Acute pain of left knee  Stiffness of left knee, not elsewhere classified  Localized edema  Other abnormalities of gait and  mobility  Unsteadiness on feet  ONSET DATE: DOS: 12/11/24   SUBJECTIVE:                                                                                                                                                                                           SUBJECTIVE STATEMENT: No complaints; still endorses no pain   Eval: Pt is s/p Lt TKA on 12/11/24.  He is walking with [facility] today.  He had HHPT x 5 visits.  PERTINENT HISTORY:  Morbid obesity, OA, HTN, OSA   PAIN:  Are you having pain? Yes: NPRS scale: 0/10 this morning, 2/10 soreness  Pain location: Lt knee Pain description: very minor toothache, barely  registering Aggravating factors: stairs, walking longer distances, trying to increase flexion Relieving factors: repositioning, rest, uses floor bike 3-4 times a day, ice  PRECAUTIONS:  None  RED FLAGS: None   WEIGHT BEARING RESTRICTIONS:  No  FALLS:  Has patient fallen in last 6 months? 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, play with dogs, golf, no regular exercise  PATIENT GOALS:  Regain function of Lt knee, improve pain   OBJECTIVE:  Note: Objective measures were completed at Evaluation unless otherwise noted.  PATIENT SURVEYS:  Patient-Specific Activity Scoring Scheme  0 represents unable to perform. 10 represents able to perform at prior level. 0 1 2 3 4 5 6 7 8 9  10 (Date and Score)   Activity Eval   01/19/25  1. Stairs 5  9  2. Walking long distances 7 9   Score 6 9   Total score = sum of the activity scores/number of activities Minimum detectable change (90%CI) for average score = 2 points Minimum detectable change (90%CI) for single activity score = 3 points    COGNITIVE STATUS: Within functional limits for tasks assessed   SENSATION: WFL  GAIT: 01/04/2025 Distance walked: 200' within clinic Assistive device utilized: Quad cane small base Level of assistance: Modified independence Comments: recommend SPC, decreased stance on Lt; decreased Lt hip/knee flexion   EDEMA: 01/04/2025  Not formally measured due to pants; visible swelling in Lt knee and thigh noted   LOWER EXTREMITY ROM:     ROM Right eval Left eval Left 01/12/25 Left 01/14/25 Left 01/19/25 AROM Left 01/26/25 AROM   Knee flexion  A: 96 P: 102 AROM seated 98*, AAROM 104* Supine A 101 96* seated  106* AAROM supine 104*  seated, 111* supine   Knee extension A: 3 hyper (seated LAQ) A: -5 (seated LAQ) P: -2 11* AROM  Supine  +4>0 A + 2>0 P 12* seated LAQ  5* supine quad set with heel prop   8* LAQ seated, 4* supine quad set     (Blank rows = not tested)   LOWER EXTREMITY MMT:   01/04/25: Not formally tested at eval; Lt knee grossly 3-/5  MMT Right eval Left eval R 01/19/25 L 01/19/25  Hip flexion      Hip extension      Hip abduction      Hip adduction      Hip internal rotation      Hip external rotation      Knee flexion   4+ 4+  Knee extension   4+ 4+  Ankle dorsiflexion      Ankle plantarflexion      Ankle inversion      Ankle eversion       (Blank rows = not tested)      TREATMENT:  L TKA DATE:  02/04/25 TherAct SciFit seat 9 x 8 min Leg press 112# 3x10; then LLE only 62# 3x10; 60 sec extension holds (with overpressure at distal thigh) between sets (x 3 reps) Knee ext machine 3x10; 10# LLE only Hamstring curl 3x10; 20# LLE only Lateral lunges 2x10 bil with light UE support Reverse lunges 2x10 bil Goblet squat 20# KB 2x10 RDL 20# KB 2x10  TherEx Seated Lt hamstring stretch with overpressure at distal thigh 3x60 sec   02/03/25 TherAct SciFit seat 9 x 8 min Leg press 112# 3x10; then LLE only 62# 3x10; 60 sec extension holds (with overpressure at distal thigh) between sets (x 5 reps) Kallie with RLE march 2x10; 5 sec hold Sit to/from stand with 20# KB 2x10 Knee ext machine 3x10; 10# LLE only  TherEx Seated hamstring stretch 3x30 sec on Lt Discussed heel prop with weight and ice to help with passive extension supine Supine hamstring stretch with strap and overpressure 3x30 sec on Lt    01/28/25 Rec bike seat - blue bar at 5- level 2 Step ups on 6 step leading with L15x with UE use forward, 15x lateral Step downs on 4 step leading with R  Air Ex tandem walking 8 laps Shuttle BLE press 112# 3x10   Shuttle single leg press 56# 2x15 LAQs 4# 2x10 L with 3 second holds STS  15# KB 2x10 with L LE staggered back    01/26/25  Nustep L5x8 minutes BLEs only for w/u (both bikes not available) Shuttle BLE press 112# x15  Shuttle single leg press 56# x15 B Tandem stance 4x30 seconds by TM rail ROM update  LAQs 4# 2x10 L with 3 second holds STS 15# KB 2x10 with L LE staggered back  Forward step ups 4 inch box x15 B Lateral step ups 4 inch box x15 B  Percussion gun distal hams and proximal calf prone    01/21/25 Scifit seat 9-->7 x8 min total, full rotations for ROM and w/u  Leg Press 100# 3x15 bilateral  L #50 2x10 TKE with TB green 2x15 Sit to stand with KB 15#  2x10 Step ups onto single L LE 2x10 4 step  Hurdles forward and lateral LAQ 3# 15x2 Seated SLR 3# 2x10 Vaso 10 med comp 34 degrees  PATIENT EDUCATION:  Education details: HEP Person educated: Patient and father-in-law Education method: Programmer, Multimedia, Facilities Manager, and Automotive Engineer  comprehension: verbalized understanding, returned demonstration, and needs further education  HOME EXERCISE PROGRAM: Access Code: [email or web address] URL: [email or web address] Date: 01/04/2025 Prepared by: Corean Ku  Exercises - Long Sitting Quad Set with Towel Roll Under Heel  - 5-10 x daily - 7 x weekly - 1 sets - 10 reps - 5 sec hold - Supine Heel Slide with Strap  - 5-10 x daily - 7 x weekly - 1 sets - 10 reps - Seated Knee Extension AROM  - 3-5 x daily - 7 x weekly - 1 sets - 10 reps - 5 sec hold - Seated Knee Extension AROM  - 3-5 x daily - 7 x weekly - 1 sets - 10 reps - 10 sec hold - Seated Straight Leg Raise   - 3-5 x daily - 7 x weekly - 1 sets - 10 reps - 5 sec hold   ASSESSMENT:  CLINICAL IMPRESSION: Progressing well with PT working on extension and functional strengthening activities.  Continue skilled PT at this time.    OBJECTIVE IMPAIRMENTS: Abnormal gait, decreased activity tolerance, decreased balance, decreased mobility, difficulty walking, decreased ROM, decreased  strength, increased muscle spasms, impaired flexibility, and pain.   ACTIVITY LIMITATIONS: carrying, lifting, sitting, standing, squatting, sleeping, stairs, transfers, bed mobility, hygiene/grooming, and locomotion level  PARTICIPATION LIMITATIONS: meal prep, cleaning, laundry, driving, shopping, community activity, occupation, and yard work  PERSONAL FACTORS: Age, Behavior pattern, Past/current experiences, Time since onset of injury/illness/exacerbation, and 3+ comorbidities: Morbid obesity, OA, HTN, OSA, Rt TKA are also affecting patient's functional outcome.   REHAB POTENTIAL: Good  CLINICAL DECISION MAKING: Stable/uncomplicated  EVALUATION COMPLEXITY: Low   GOALS: Goals reviewed with patient? Yes  SHORT TERM GOALS: Target date: 02/01/2025  Independent with initial HEP Goal status: MET 01/15/25  2.  Lt knee AROM improved 0-100 for improved mobility and function Goal status: PARTIALLY MET 01/26/25  LONG TERM GOALS: Ongoing 01/14/25   Independent with final HEP Goal status: ONGOING 01/19/25  2.  PSFS score improved by 2 points Goal status: MET 01/19/25  3.  Lt knee AROM improved to 0-110 for improved function and mobility Goal status: ONGOING 01/19/25  4.  Report pain < 3/10 with standing and walking activities for improved function Goal status: PARTIALLY MET 01/19/25 (pain 2/10 usually, but ache can get to 4-5/10)  5.  Amb without AD independently and without significant deviations for improved function and mobility Goal status: ONGOING 01/19/25  6.  Negotiate stairs with single handrail reciprocally with pain <2/10 Goal status: ONGOING 01/19/25      PLAN:  PT FREQUENCY: 2x/week  PT DURATION: 8 weeks  PLANNED INTERVENTIONS: 97164- PT Re-evaluation, 97750- Physical Performance Testing, 97110-Therapeutic exercises, 97530- Therapeutic activity, 97112- Neuromuscular re-education, 97535- Self Care, 02859- Manual therapy, [phone]- Gait training, [phone]- Aquatic Therapy,  [phone]- Electrical stimulation (unattended), 97016- Vasopneumatic device, [phone] (1-2 muscles), 20561 (3+ muscles)- Dry Needling, Patient/Family education, Balance training, Stair training, Taping, Joint mobilization, Joint manipulation, DME instructions, Cryotherapy, and Moist heat.  PLAN FOR NEXT SESSION: will need updated measurements, HEP updates PRN, continue extension focus/quad activation. Balance/strengthening    Corean JULIANNA Ku, PT, DPT 02/04/25 10:02 AM        "

## 2025-02-09 ENCOUNTER — Encounter

## 2025-02-09 ENCOUNTER — Encounter: Admitting: Physical Therapy

## 2025-02-11 ENCOUNTER — Encounter

## 2025-02-16 ENCOUNTER — Encounter

## 2025-02-17 ENCOUNTER — Ambulatory Visit: Admitting: Orthopaedic Surgery

## 2025-02-17 ENCOUNTER — Encounter: Admitting: Rehabilitative and Restorative Service Providers"

## 2025-02-18 ENCOUNTER — Encounter: Admitting: Rehabilitative and Restorative Service Providers"
